# Patient Record
Sex: Female | Born: 1965 | Race: White | Hispanic: No | Marital: Married | Smoking: Never smoker
Health system: Southern US, Community
[De-identification: ages and names within clinical notes are randomized; demographics above are authoritative.]

## PROBLEM LIST (undated history)

## (undated) DIAGNOSIS — G809 Cerebral palsy, unspecified: Secondary | ICD-10-CM

## (undated) DIAGNOSIS — R32 Unspecified urinary incontinence: Secondary | ICD-10-CM

## (undated) DIAGNOSIS — J9600 Acute respiratory failure, unspecified whether with hypoxia or hypercapnia: Secondary | ICD-10-CM

## (undated) DIAGNOSIS — E785 Hyperlipidemia, unspecified: Secondary | ICD-10-CM

## (undated) DIAGNOSIS — G40909 Epilepsy, unspecified, not intractable, without status epilepticus: Secondary | ICD-10-CM

## (undated) DIAGNOSIS — R4702 Dysphasia: Secondary | ICD-10-CM

## (undated) DIAGNOSIS — F329 Major depressive disorder, single episode, unspecified: Secondary | ICD-10-CM

## (undated) DIAGNOSIS — F419 Anxiety disorder, unspecified: Secondary | ICD-10-CM

## (undated) DIAGNOSIS — R159 Full incontinence of feces: Secondary | ICD-10-CM

## (undated) DIAGNOSIS — E039 Hypothyroidism, unspecified: Secondary | ICD-10-CM

## (undated) DIAGNOSIS — I10 Essential (primary) hypertension: Secondary | ICD-10-CM

## (undated) DIAGNOSIS — F32A Depression, unspecified: Secondary | ICD-10-CM

## (undated) DIAGNOSIS — R569 Unspecified convulsions: Secondary | ICD-10-CM

## (undated) DIAGNOSIS — N319 Neuromuscular dysfunction of bladder, unspecified: Secondary | ICD-10-CM

## (undated) DIAGNOSIS — R1310 Dysphagia, unspecified: Secondary | ICD-10-CM

## (undated) DIAGNOSIS — G43909 Migraine, unspecified, not intractable, without status migrainosus: Secondary | ICD-10-CM

## (undated) DIAGNOSIS — O2230 Deep phlebothrombosis in pregnancy, unspecified trimester: Secondary | ICD-10-CM

## (undated) DIAGNOSIS — N200 Calculus of kidney: Secondary | ICD-10-CM

## (undated) DIAGNOSIS — M6281 Muscle weakness (generalized): Secondary | ICD-10-CM

## (undated) DIAGNOSIS — F70 Mild intellectual disabilities: Secondary | ICD-10-CM

## (undated) DIAGNOSIS — F29 Unspecified psychosis not due to a substance or known physiological condition: Secondary | ICD-10-CM

## (undated) DIAGNOSIS — A419 Sepsis, unspecified organism: Secondary | ICD-10-CM

## (undated) DIAGNOSIS — J189 Pneumonia, unspecified organism: Secondary | ICD-10-CM

## (undated) DIAGNOSIS — J309 Allergic rhinitis, unspecified: Secondary | ICD-10-CM

## (undated) DIAGNOSIS — K449 Diaphragmatic hernia without obstruction or gangrene: Secondary | ICD-10-CM

## (undated) DIAGNOSIS — E559 Vitamin D deficiency, unspecified: Secondary | ICD-10-CM

## (undated) DIAGNOSIS — L89159 Pressure ulcer of sacral region, unspecified stage: Secondary | ICD-10-CM

## (undated) DIAGNOSIS — G894 Chronic pain syndrome: Secondary | ICD-10-CM

## (undated) DIAGNOSIS — J449 Chronic obstructive pulmonary disease, unspecified: Secondary | ICD-10-CM

---

## 1997-09-08 ENCOUNTER — Other Ambulatory Visit: Admission: RE | Admit: 1997-09-08 | Discharge: 1997-09-08 | Payer: Self-pay | Admitting: Obstetrics and Gynecology

## 2000-10-14 ENCOUNTER — Inpatient Hospital Stay (HOSPITAL_COMMUNITY): Admission: EM | Admit: 2000-10-14 | Discharge: 2000-10-20 | Payer: Self-pay | Admitting: Psychiatry

## 2004-01-01 ENCOUNTER — Emergency Department (HOSPITAL_COMMUNITY): Admission: EM | Admit: 2004-01-01 | Discharge: 2004-01-02 | Payer: Self-pay | Admitting: Emergency Medicine

## 2004-01-05 ENCOUNTER — Emergency Department (HOSPITAL_COMMUNITY): Admission: EM | Admit: 2004-01-05 | Discharge: 2004-01-05 | Payer: Self-pay | Admitting: Emergency Medicine

## 2004-02-14 ENCOUNTER — Inpatient Hospital Stay (HOSPITAL_COMMUNITY): Admission: EM | Admit: 2004-02-14 | Discharge: 2004-02-16 | Payer: Self-pay | Admitting: *Deleted

## 2004-03-03 ENCOUNTER — Emergency Department (HOSPITAL_COMMUNITY): Admission: EM | Admit: 2004-03-03 | Discharge: 2004-03-04 | Payer: Self-pay | Admitting: Emergency Medicine

## 2004-03-25 ENCOUNTER — Emergency Department (HOSPITAL_COMMUNITY): Admission: EM | Admit: 2004-03-25 | Discharge: 2004-03-26 | Payer: Self-pay | Admitting: Emergency Medicine

## 2004-04-01 ENCOUNTER — Emergency Department (HOSPITAL_COMMUNITY): Admission: EM | Admit: 2004-04-01 | Discharge: 2004-04-01 | Payer: Self-pay | Admitting: Emergency Medicine

## 2004-04-24 ENCOUNTER — Emergency Department (HOSPITAL_COMMUNITY): Admission: EM | Admit: 2004-04-24 | Discharge: 2004-04-24 | Payer: Self-pay | Admitting: Emergency Medicine

## 2004-06-16 ENCOUNTER — Emergency Department (HOSPITAL_COMMUNITY): Admission: EM | Admit: 2004-06-16 | Discharge: 2004-06-16 | Payer: Self-pay | Admitting: Emergency Medicine

## 2004-08-19 ENCOUNTER — Emergency Department (HOSPITAL_COMMUNITY): Admission: EM | Admit: 2004-08-19 | Discharge: 2004-08-19 | Payer: Self-pay | Admitting: Emergency Medicine

## 2004-09-04 ENCOUNTER — Emergency Department (HOSPITAL_COMMUNITY): Admission: EM | Admit: 2004-09-04 | Discharge: 2004-09-04 | Payer: Self-pay | Admitting: Emergency Medicine

## 2004-09-11 ENCOUNTER — Emergency Department (HOSPITAL_COMMUNITY): Admission: EM | Admit: 2004-09-11 | Discharge: 2004-09-11 | Payer: Self-pay | Admitting: Emergency Medicine

## 2004-09-16 ENCOUNTER — Emergency Department (HOSPITAL_COMMUNITY): Admission: EM | Admit: 2004-09-16 | Discharge: 2004-09-16 | Payer: Self-pay | Admitting: Emergency Medicine

## 2004-10-04 ENCOUNTER — Emergency Department (HOSPITAL_COMMUNITY): Admission: EM | Admit: 2004-10-04 | Discharge: 2004-10-04 | Payer: Self-pay | Admitting: Emergency Medicine

## 2004-10-19 ENCOUNTER — Emergency Department (HOSPITAL_COMMUNITY): Admission: EM | Admit: 2004-10-19 | Discharge: 2004-10-19 | Payer: Self-pay | Admitting: Emergency Medicine

## 2004-10-27 ENCOUNTER — Emergency Department (HOSPITAL_COMMUNITY): Admission: EM | Admit: 2004-10-27 | Discharge: 2004-10-27 | Payer: Self-pay | Admitting: Emergency Medicine

## 2004-11-10 ENCOUNTER — Emergency Department (HOSPITAL_COMMUNITY): Admission: EM | Admit: 2004-11-10 | Discharge: 2004-11-10 | Payer: Self-pay | Admitting: Emergency Medicine

## 2004-11-18 ENCOUNTER — Emergency Department (HOSPITAL_COMMUNITY): Admission: EM | Admit: 2004-11-18 | Discharge: 2004-11-18 | Payer: Self-pay | Admitting: Emergency Medicine

## 2004-11-20 ENCOUNTER — Emergency Department (HOSPITAL_COMMUNITY): Admission: EM | Admit: 2004-11-20 | Discharge: 2004-11-20 | Payer: Self-pay | Admitting: Emergency Medicine

## 2004-12-21 ENCOUNTER — Emergency Department (HOSPITAL_COMMUNITY): Admission: EM | Admit: 2004-12-21 | Discharge: 2004-12-21 | Payer: Self-pay | Admitting: Emergency Medicine

## 2005-01-17 ENCOUNTER — Emergency Department (HOSPITAL_COMMUNITY): Admission: EM | Admit: 2005-01-17 | Discharge: 2005-01-18 | Payer: Self-pay | Admitting: Emergency Medicine

## 2005-02-11 ENCOUNTER — Emergency Department (HOSPITAL_COMMUNITY): Admission: EM | Admit: 2005-02-11 | Discharge: 2005-02-11 | Payer: Self-pay | Admitting: Emergency Medicine

## 2005-03-26 ENCOUNTER — Emergency Department (HOSPITAL_COMMUNITY): Admission: EM | Admit: 2005-03-26 | Discharge: 2005-03-26 | Payer: Self-pay | Admitting: Emergency Medicine

## 2009-06-23 ENCOUNTER — Inpatient Hospital Stay: Payer: Self-pay | Admitting: Internal Medicine

## 2009-07-22 ENCOUNTER — Ambulatory Visit: Payer: Self-pay | Admitting: Urology

## 2009-07-23 ENCOUNTER — Ambulatory Visit: Payer: Self-pay | Admitting: Family Medicine

## 2009-10-06 ENCOUNTER — Ambulatory Visit: Payer: Self-pay | Admitting: Cardiovascular Disease

## 2009-12-29 ENCOUNTER — Emergency Department: Payer: Self-pay | Admitting: Unknown Physician Specialty

## 2010-01-26 ENCOUNTER — Ambulatory Visit: Payer: Self-pay | Admitting: Family Medicine

## 2010-03-14 ENCOUNTER — Inpatient Hospital Stay: Payer: Self-pay | Admitting: Specialist

## 2010-03-23 ENCOUNTER — Emergency Department (HOSPITAL_COMMUNITY)
Admission: EM | Admit: 2010-03-23 | Discharge: 2010-03-23 | Payer: Self-pay | Source: Home / Self Care | Admitting: Emergency Medicine

## 2010-03-26 LAB — URINE MICROSCOPIC-ADD ON

## 2010-03-26 LAB — URINALYSIS, ROUTINE W REFLEX MICROSCOPIC
Urine Glucose, Fasting: NEGATIVE mg/dL
Urobilinogen, UA: 1 mg/dL (ref 0.0–1.0)

## 2010-03-27 LAB — URINE CULTURE: Colony Count: NO GROWTH

## 2010-04-15 ENCOUNTER — Emergency Department (HOSPITAL_COMMUNITY): Payer: Medicare Other

## 2010-04-15 ENCOUNTER — Emergency Department (HOSPITAL_COMMUNITY)
Admission: EM | Admit: 2010-04-15 | Discharge: 2010-04-15 | Disposition: A | Discharge status: Home / Self Care | Payer: Medicare Other | Attending: Emergency Medicine | Admitting: Emergency Medicine

## 2010-04-15 DIAGNOSIS — F79 Unspecified intellectual disabilities: Secondary | ICD-10-CM | POA: Insufficient documentation

## 2010-04-15 DIAGNOSIS — G809 Cerebral palsy, unspecified: Secondary | ICD-10-CM | POA: Insufficient documentation

## 2010-04-15 DIAGNOSIS — M79609 Pain in unspecified limb: Secondary | ICD-10-CM | POA: Insufficient documentation

## 2010-04-15 DIAGNOSIS — IMO0002 Reserved for concepts with insufficient information to code with codable children: Secondary | ICD-10-CM | POA: Insufficient documentation

## 2010-04-15 DIAGNOSIS — R059 Cough, unspecified: Secondary | ICD-10-CM | POA: Insufficient documentation

## 2010-04-15 DIAGNOSIS — R10814 Left lower quadrant abdominal tenderness: Secondary | ICD-10-CM | POA: Insufficient documentation

## 2010-04-15 DIAGNOSIS — R112 Nausea with vomiting, unspecified: Secondary | ICD-10-CM | POA: Insufficient documentation

## 2010-04-15 DIAGNOSIS — I10 Essential (primary) hypertension: Secondary | ICD-10-CM | POA: Insufficient documentation

## 2010-04-15 DIAGNOSIS — R05 Cough: Secondary | ICD-10-CM | POA: Insufficient documentation

## 2010-04-15 DIAGNOSIS — R509 Fever, unspecified: Secondary | ICD-10-CM | POA: Insufficient documentation

## 2010-04-15 LAB — CBC
HCT: 36.9 % (ref 36.0–46.0)
MCHC: 33.1 g/dL (ref 30.0–36.0)
MCV: 94.6 fL (ref 78.0–100.0)
Platelets: 108 10*3/uL — ABNORMAL LOW (ref 150–400)
RBC: 3.9 MIL/uL (ref 3.87–5.11)

## 2010-04-15 LAB — URINALYSIS, ROUTINE W REFLEX MICROSCOPIC
Bilirubin Urine: NEGATIVE
Ketones, ur: NEGATIVE mg/dL
Protein, ur: NEGATIVE mg/dL
Specific Gravity, Urine: 1.014 (ref 1.005–1.030)
Urine Glucose, Fasting: NEGATIVE mg/dL
Urobilinogen, UA: 0.2 mg/dL (ref 0.0–1.0)

## 2010-04-15 LAB — DIFFERENTIAL
Basophils Absolute: 0 10*3/uL (ref 0.0–0.1)
Basophils Relative: 0 % (ref 0–1)
Eosinophils Absolute: 0.4 10*3/uL (ref 0.0–0.7)
Eosinophils Relative: 8 % — ABNORMAL HIGH (ref 0–5)

## 2010-04-15 LAB — BASIC METABOLIC PANEL
BUN: 23 mg/dL (ref 6–23)
Calcium: 9.9 mg/dL (ref 8.4–10.5)
Glucose, Bld: 124 mg/dL — ABNORMAL HIGH (ref 70–99)
Sodium: 144 mEq/L (ref 135–145)

## 2010-04-15 LAB — URINE MICROSCOPIC-ADD ON

## 2010-04-16 ENCOUNTER — Emergency Department (HOSPITAL_COMMUNITY)
Admission: EM | Admit: 2010-04-16 | Discharge: 2010-04-16 | Disposition: A | Discharge status: Home / Self Care | Payer: Medicare Other | Attending: Emergency Medicine | Admitting: Emergency Medicine

## 2010-04-16 ENCOUNTER — Emergency Department (HOSPITAL_COMMUNITY): Payer: Medicare Other

## 2010-04-16 DIAGNOSIS — F79 Unspecified intellectual disabilities: Secondary | ICD-10-CM | POA: Insufficient documentation

## 2010-04-16 DIAGNOSIS — F329 Major depressive disorder, single episode, unspecified: Secondary | ICD-10-CM | POA: Insufficient documentation

## 2010-04-16 DIAGNOSIS — R51 Headache: Secondary | ICD-10-CM | POA: Insufficient documentation

## 2010-04-16 DIAGNOSIS — Z79899 Other long term (current) drug therapy: Secondary | ICD-10-CM | POA: Insufficient documentation

## 2010-04-16 DIAGNOSIS — G40909 Epilepsy, unspecified, not intractable, without status epilepticus: Secondary | ICD-10-CM | POA: Insufficient documentation

## 2010-04-16 DIAGNOSIS — G808 Other cerebral palsy: Secondary | ICD-10-CM | POA: Insufficient documentation

## 2010-04-16 DIAGNOSIS — F3289 Other specified depressive episodes: Secondary | ICD-10-CM | POA: Insufficient documentation

## 2010-04-16 DIAGNOSIS — I10 Essential (primary) hypertension: Secondary | ICD-10-CM | POA: Insufficient documentation

## 2010-04-16 DIAGNOSIS — Z86718 Personal history of other venous thrombosis and embolism: Secondary | ICD-10-CM | POA: Insufficient documentation

## 2010-04-16 DIAGNOSIS — Z7982 Long term (current) use of aspirin: Secondary | ICD-10-CM | POA: Insufficient documentation

## 2010-04-16 DIAGNOSIS — E039 Hypothyroidism, unspecified: Secondary | ICD-10-CM | POA: Insufficient documentation

## 2010-04-16 LAB — URINE MICROSCOPIC-ADD ON

## 2010-04-16 LAB — DIFFERENTIAL
Basophils Absolute: 0 10*3/uL (ref 0.0–0.1)
Eosinophils Absolute: 0.5 10*3/uL (ref 0.0–0.7)
Eosinophils Relative: 10 % — ABNORMAL HIGH (ref 0–5)
Lymphocytes Relative: 31 % (ref 12–46)
Monocytes Relative: 14 % — ABNORMAL HIGH (ref 3–12)

## 2010-04-16 LAB — COMPREHENSIVE METABOLIC PANEL
BUN: 21 mg/dL (ref 6–23)
CO2: 31 mEq/L (ref 19–32)
Creatinine, Ser: 0.63 mg/dL (ref 0.4–1.2)
GFR calc non Af Amer: >60 mL/min (ref >60)
Glucose, Bld: 91 mg/dL (ref 70–99)
Potassium: 3.7 mEq/L (ref 3.5–5.1)
Total Bilirubin: 0.4 mg/dL (ref 0.3–1.2)
Total Protein: 6 g/dL (ref 6.0–8.3)

## 2010-04-16 LAB — URINALYSIS, ROUTINE W REFLEX MICROSCOPIC
Bilirubin Urine: NEGATIVE
Nitrite: NEGATIVE
Urine Glucose, Fasting: NEGATIVE mg/dL
pH: 6.5 (ref 5.0–8.0)

## 2010-04-16 LAB — CBC
MCH: 31.8 pg (ref 26.0–34.0)
MCHC: 33.7 g/dL (ref 30.0–36.0)
Platelets: 116 10*3/uL — ABNORMAL LOW (ref 150–400)
WBC: 5.1 10*3/uL (ref 4.0–10.5)

## 2010-04-16 LAB — VALPROIC ACID LEVEL: Valproic Acid Lvl: 58 ug/mL (ref 50.0–100.0)

## 2010-04-18 LAB — URINE CULTURE
Colony Count: 15000
Culture  Setup Time: 201202140148

## 2010-07-20 NOTE — H&P (Signed)
NAMEROSELY, FERNANDEZ                 ACCOUNT NO.:  [id number]   MEDICAL RECORD NO.:  [id number]          PATIENT TYPE:  INP   LOCATION:  2011                         FACILITY:  [facility]   PHYSICIAN:  Deirdre Peer. Polite, M.D. DATE OF BIRTH:  [birth year]   DATE OF ADMISSION:  02/13/2004  DATE OF DISCHARGE:                                HISTORY & PHYSICAL   CHIEF COMPLAINT:  Diarrhea.   HISTORY OF THE PRESENT ILLNESS:  Ms. 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  Please note, the patient is a poor historian and limited  information is provided and obtained from current records; but, by report  the patient has been having some diarrhea and occasionally some nausea, but  she denies any emesis.  She also denies any fever or chills.  She does  complain of some abdominal discomfort.  The patient is transferred to the ED  for evaluation because of concerns of diarrhea.   In the ED the patient was evaluated and found to be tachycardic as high as  120-130, afebrile and labs significant for abnormal UA with many bacteria  and white blood cells.  Admission is deemed necessary for further evaluation  and treatment as the patient has a suprapubic catheter, and has a history of  recurrent UTIs.   At the time of my eval the patient is alert and oriented and in no apparent  distress.  She is unable to give any pertinent information secondary to  being a poor historian.  E chart was reviewed and no past discharge  summaries were found under the current MR number.  After review of the labs  it is felt the patient needs admission for a UTI and probable urosepsis.   PAST MEDICAL HISTORY:  The past medical history is per the record:  1.  Cerebral palsy.  2.  Left leg DVT on Coumadin.  3.   Questionable bronchitis.  4.  Questionable blindness.  5.  The patient states she may have had some trouble with her heart in the      past.  6.  Denies any diabetes or hypertension.   MEDICATIONS:  The medications per records show:  1.  Klonopin 0.5 mg  b.i.d.  2.  Tofranil 50 mg q.h.s.  3.  Naprosyn 500 mg b.i.d.  4.  Coumadin 10 mg daily.  5.  P.R.N. Tylenol.  6.  P.R.N. Mylanta, Darvocet and Ambien.   SOCIAL HISTORY:  The patient denies tobacco, alcohol or drugs.   PAST SURGICAL HISTORY:  The patient states she has a history of bilateral  Achilles cord surgery, prior knee surgery and eye surgery.  Patient has also  had a suprapubic catheter; patient states she has had this for approximately  four years.   FAMILY HISTORY:  The family history is noncontributory.   ALLERGIES:  The patient states she is allergic to SULFA.  REVIEW OF SYSTEMS:  The review of systems is as stated in the HPI.   PHYSICAL EXAMINATION:  GENERAL APPEARANCE:  In general the patient is alert  and oriented, and in no apparent distress.  VITAL SIGNS:  Temp 98.9, BP 145/89, pulse 110-122, respiratory rate of 22,  and sat 99%.  HEENT:  Within normal limits.  CHEST:  The chest clear to auscultation.  No rales.  No rhonchi.  HEART:  Cardiovascular is tachycardic.  ABDOMEN:  The abdomen is soft and nontender.  The patient does have a  suprapubic catheter and there does not appear to be any discharge from the  ostomy site where the CT enters the abdominal wall; however,  there is a  foul odor emanating from that area.  RECTAL:  The rectal exam is deferred.  NEUROLOGIC:  The patient is moving the upper extremities.  There is no  movement in the lower extremities. The patient has obvious atrophy in the  lower extremities.   LABORATORY DATA:  CBC; white count 9.1, hemoglobin 14.1 and platelets  185,000.  INR 2.5.  UA; specific gravity 1.022, moderate hemoglobin, ketones  negative, protein 30, leukocyte  esterase large, urine epithelium few, and  urine white blood cells 2,150, urine red blood cells 3-6, and bacteria many.  BMET; sodium 136, potassium 3.5, chloride 108, carbon dioxide 21, glucose  98, BUN 8, and creatinine 0.5.  AST and ALT 22 and 28.  Bilirubin 0.8.  Lipase 26.   ASSESSMENT:  1.  Urinary tract infection, rule out urosepsis as patient is tachycardiac      and has obvious signs of dehydration.  2.  Diarrhea, rule out secondary to #1 versus Clostridium difficile as the      patient has probably been on multiple antibiotics for recurrent urinary      tract infections.  3.  Cerebral palsy.  4.  History of deep venous thrombosis on Coumadin with a therapeutic INR.  5.  Suprapubic catheter; please note last changed one month ago.  6.  Poor historian.  7.  Tachycardia secondary to dehydration.  8.  Dehydration.   RECOMMENDATIONS:  1.  Recommend patient be admitted to a telemetry floor bed.  2.  Patient will be pancultured.  3.  Will be monitored.  4.  Will give IV fluids and IV antibiotics.  5.  Will consider a urology consult to see if it is time to have the      patient's catheter changed as this is probably the      most likely source of current infection.  6.  As the patient has diarrhea we will rule out C diff.  7.  Will make further recommendations after we get above studies.      Joen Laura   RDP/MEDQ  D:  02/14/2004  T:  02/14/2004  Job:  [id number]

## 2010-07-20 NOTE — Discharge Summary (Signed)
[facility]  Patient:    Kristi Perry, Kristi Perry Visit Number: [id number] MRN: [id number]          Service Type: PSY Location: 30 0300 02 Attending Physician:  Denny Peon Adm. Date:  [id number]                             Discharge Summary  INTRODUCTION:  The patient was admitted on involuntary commitment because of symptoms of depression.  Apparently she wrapped a cord around her neck while in the group home in attempt to strangle herself.  The group home manager complained with patients increased irritability and also she was having auditory and visual hallucinations.  The patient was unhappy with how things were at the residential setting but according to mother, she is never satisfied no matter where she is.  Since patient expressed suicidal ideations and made suicidal gesture, she was admitted to our hospital for evaluation and treatment.  The patient has a long history with [facility] and has a previous history of at least two admissions in 2001 at [facility].  MEDICAL PROBLEMS:  The patient suffers from cerebral palsy.  Upon admission, she had cellulitis of lower leg which was treated with Augmentin 875 mg twice a day with good results while on the unit.  As mentioned before, patient has a history of cerebral palsy and for all practical purposes, she is wheelchair bound.  Details of patients history are available in the chart.  HOSPITAL COURSE:  After admission to the ward, patient was placed on special observation.  Her medication has been gradually adjusted, in particular, increased dose of Seroquel to help with her auditory hallucinations and their impulsivity.  I kept patient on the same dose of Celexa.  As time passed, patient adjusted well to a new environment and improved markedly.  There were not any major behavioral incidents worth mentioning.  I was in contact with patients mother and case worker  contacted the mother several times as well. On the day of discharge, patient presented with bright affect, no dangerous ideations, no psychosis.  She still did not feel ready for discharge but it seems like patient has a hard time making any changes and also seems like that she adjusted to attention she got while in the hospital and is going to miss it.  Nevertheless, treatment team felt and undersigned concurred that the patient reached maximum benefit of her hospitalization and could be discharged home.  No side-effects from medications were noted.  Review of blood work showed normal hemoglobin, chemistry-17 and thyroid panel.  Vital signs were stable throughout the hospitalization.  The patient was afebrile.  Cellulitis cleared while treated with Augmentin.  The patient had some edema of lower legs which improved much since she started being compliant with my request for her to keep her legs elevated.  On the day of discharge, bright affect, no dangerous ideation, no psychosis, a bit anxious when discharge was announced, tearful, ______ 0.5 mg p.r.n. anxiety and felt well since.  DISCHARGE DIAGNOSES: Axis I:    Major depression disorder, recurrent, moderate to severe, with            psychotic features. Axis II:   Borderline intellectual functioning. Axes III:  1. Cellulitis, left calf.            2. Cerebral palsy. Axis IV:   Moderate stressor leading to personal care home. Axis V:  Global assessment of functioning upon admission was 35; for the            past year maximum of 60; upon discharge between 55 and 60.  DISCHARGE RECOMMENDATION:  The patient received prescription for the following medications: 1. Seroquel 25 mg three times a day and Seroquel 150 mg at bedtime. 2. Depakote 500 mg at bedtime. 3. Celexa 40 mg in the morning. 4. Multivitamins over the counter one tablet daily. 5. Detrol LA 4 mg one daily. 6. Augmentin 875 mg twice a day -- to be stopped on October 22, 2000.  Patient should check with family doctor if recurrence of cellulitis on lower leg.  She should keep legs elevated while sitting.   She also should have liver function tests and Depakote level checked on a monthly basis.  Patient should call mental health if problems with medication, presence of side-effects or recurrence of her symptoms.  Patient understood instructions and in good condition, was discharged in care of personal care home. DD:  10/20/00 TD:  10/20/00 Job: [id number]

## 2010-07-20 NOTE — Consult Note (Signed)
Kristi Perry, Kristi Perry                 ACCOUNT NO.:  [id number]   MEDICAL RECORD NO.:  [id number]          PATIENT TYPE:  INP   LOCATION:  2011                         FACILITY:  [facility]   PHYSICIAN:  Jamison Neighbor, M.D.  DATE OF BIRTH:  1965/03/19   DATE OF CONSULTATION:  02/15/2004  DATE OF DISCHARGE:                                   CONSULTATION   REASON FOR CONSULTATION:  Request replacement of suprapubic tube and  evaluation of possible UTI.   HISTORY:  This 45 year old female suffers from cerebral palsy.  The patient  has had a suprapubic catheter for several years.  The patient is in assisted  living facility and has the catheter changed every month.  The patient is  now in the hospital because of problems with diarrhea and tachycardia.  It  was felt at the time of admission that she probably was dehydrated and it  was thought that she might have a urinary tract infection.  Cultures have  been ordered and the patient has been started on antibiotic therapy.  Urologic consult was requested to change her suprapubic tube.   The patient does have her tube changed every month by nursing personnel at  her assisted living facility.  I do not know whether the hospital rules and  regulations allow nurse to change the catheter but certainly catheters that  are stable and have been in for a long period of time are generally changed  by nurses.   PAST MEDICAL HISTORY:  1.  Remarkable, of course, for cerebral palsy.  2.  The patient has a left leg DVT and has been on Coumadin.  3.  She has had problems with bronchitis.  4.  She also states that she may have had a stroke.   CURRENT MEDICATIONS:  1.  Tofranil.  2.  Protonix.  3.  Klonopin.  4.  Cymbalta.  5.  Coumadin.  6.  IV Cipro.  7.  Percocet.  8.  Phenergan.  9.  Restoril.  10. Xopenex.  11. Nitroglycerin.   SOCIAL HISTORY:  Unremarkable.  She does not use tobacco or alcohol.   PAST SURGICAL HISTORY:  1.  Achilles' cord  surgery.  2.  Knee surgery.  3.  Eye surgery.  4.  Placement of suprapubic catheter.  The patient does not remember who      actually replaced this catheter.   ALLERGIES:  SULFA.   PHYSICAL EXAMINATION:  GENERAL:  The patient is found at the bed side.  She  was able to communicate a reasonable amount of information to [place].  She was  alert and oriented x3.  VITAL SIGNS:  At the time of the evaluation she is afebrile.  Blood pressure  135/70, pulse 92, respirations 16, O2 saturation 100.  Patient has had good  urine output through the catheter which is clear.  ABDOMEN:  No CVA tenderness noted.  Abdomen soft, nontender with no palpable  mass, rebound, or guarding.  GENITOURINARY:  Not performed.   I changed the suprapubic tube without difficulty and placed a new 18-French  catheter.  I think when she gets back to the nursing home  they are going to  want to put in a larger catheter.  This is the only one available on the  floor at this time.   If the patient is here long enough to require another catheter, I will be  more than happy to show the nurses how to do this if they are allowed to do  this by hospital rules and regulations.   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  More than likely she will be given  antibiotics until  asymptomatic and then continue with her regular stent changes on a monthly  basis.  One thing that may help is to place her on Urex one tablet b.i.d.  which is a urinary acidifier and can cut down on infections without raising  the risk of the development of resistant organisms.  If the patient requires  another catheter change or if there are any other urologic needs please  contact the office and we would be happy to see her.       RJE/MEDQ  D:   02/15/2004  T:  02/15/2004  Job:  [id number]   cc:   Deirdre Peer. Polite, M.D.

## 2010-07-20 NOTE — H&P (Signed)
[facility]  Patient:    Kristi Perry, STEADMAN                        MRN: [id number] Adm. Date:  [id number] Disc. Date: [id number] Attending:  Kelvin Cellar Dictator:   Young Berry Lorin Picket, N.P.                   Psychiatric Admission Assessment  DATE OF ASSESSMENT:  October 15, 2000 at 9 a.m.  IDENTIFYING INFORMATION:  This is a 45 year old Caucasian female, who is involuntarily committed for symptoms of depression.  HISTORY OF PRESENT ILLNESS:  Patient is interviewed today with Dr. Netta Cedars.  The patient is involuntarily committed after wrapping a cord around her neck at the group home and attempting to strangle herself.  The group home manager complained of the patients increased irritability and stated that she was also having auditory and visual hallucinations.  Today, the patient confirms that the group home manager had given her 30-days notice as a warning about her behavior and the patient admits that she has been irritable and angry at times with the managers and with other residents.  She reports that she has been depressed for approximately two weeks and hearing voices saying that "someone wants to shoot me."  Patient reports also difficulty sleeping at night because of the voices bothering her.  Patient also reports seeing "people and stars" in her vision.  She states that both her auditory and visual hallucinations persist even today.  Patient states that she wants to die but thinks that she would be more hopeful if she could find another place to live.  She is able to promise safety on the unit and states that she hopes she can get some help here because "my thinking is confused."  PAST PSYCHIATRIC HISTORY:  Patient is a longstanding patient at [facility] for many years as an outpatient.  Her caseworker is [employer].  She has a history of prior inpatient admissions at least twice in 2000 and 2001 at [facility].  SOCIAL HISTORY:  Patient lives at [facility] since August of 2001. Her parents are Onalee Hua and [employer], who she states live in [place] but she states she is estranged from them and gets no support and sees them rarely.  She states that she attended special education classes through the 12th grade and did graduate and she works at [employer], a division of [employer], on a daily basis.  She states that she is originally from [place] and moved to [place] several years ago.  She is single, never married.  She has no children.  FAMILY HISTORY:  Not known.  ALCOHOL/DRUG HISTORY:  Patient is a nonsmoker.  She denies any drug or alcohol abuse.  MEDICAL HISTORY:  [facility] reports that patients primary care physician is Dr. Barnetta Chapel.  Medical problems include cerebral palsy. Patient was also seen in the emergency room yesterday and diagnosed with cellulitis of her left lower leg and rash in her crural folds.  MEDICATIONS:  Depakote 500 mg p.o. q.h.s., Depo-Provera every three months, Augmentin 875 mg q.12h. for seven days, Lamisil cream to diaper area and crural folds b.i.d., Celexa 40 mg q.d., Detrol 4 mg p.o. q.d., multivitamin q.d. and Seroquel 25 mg b.i.d. and q.12h. p.r.n. for agitation.  DRUG ALLERGIES:  ASA and SULFA.  POSITIVE PHYSICAL FINDINGS:  Patient  was seen in the ER at [facility].  Her PE revealed some redness and trace edema in her left lower leg and she was prescribed Augmentin for cellulitis in her left leg.  On admission to the unit, her temperature is 97.1, pulse 99, respirations 14, blood pressure 112/73.  She is approximately 5 feet 3 inches tall and weighs 176 pounds.  Exam today, while toileting, reveals that she does have a reddened area in bilateral crural folds, however, her skin is intact.  The area is erythematous but not excoriated.  LABORATORY DATA:  Patients  CBC is within normal limits.  CMET, taken yesterday, showed potassium at 4.0; today that is at 3.4.  Patients valproic acid level, thyroid panel, urinalysis and UDS are all pending at this time.  MENTAL STATUS EXAMINATION:  This is a casually dressed Caucasian female. Hygiene is satisfactory.  She is in a wheelchair with an anxious affect.  She is tearful and cooperative.  Speech is slow in pace and normal in tone.  Mood is tearful, sad and anxious.  Thought process is positive for visual hallucinations and auditory hallucinations.  Positive for some suicidal ideation with no intent or plan.  She is able to promise safety on the unit. No homicidal ideation.  Cognitively, she is oriented x 3 and is intact.  Her intelligence is below average.  Judgment is fair to poor.  Her distant recall is poor.  Her immediate recall is satisfactory.  Insight is poor.  DIAGNOSES: Axis I:    Major depression, recurrent, severe with psychotic features. Axis II:   Deferred. Axis III:  1. Cellulitis of her left calf.            2. Cerebral palsy. Axis IV:   Deferred. Axis V:    Current 35; past year 59.  PLAN:  Voluntarily admit the patient to stabilize her mood and to alleviate her suicidal thoughts.  We will assist her with ADLs as needed, especially toileting on a p.r.n. basis and q.2h.  We will push Gatorade and recheck electrolytes on August 16.  We will restart her routine medications and give Lamisil cream to her reddened areas and her crural folds and we will give her Augmentin 875 mg b.i.d. for her cellulitis.  We will increase her Seroquel to 25 mg b.i.d. and q.12h. p.r.n. for hallucinations and also give her 50 mg q.h.s. for sleep.  We will contact [facility] for additional history and we will ask the caseworker to assist with discharge planning and evaluate her placement options.  ESTIMATED LENGTH OF STAY:  Three to five days. DD:  10/15/00 TD:  10/15/00 Job:  [id number]

## 2011-03-25 ENCOUNTER — Encounter (HOSPITAL_COMMUNITY): Payer: Self-pay | Admitting: *Deleted

## 2011-03-25 ENCOUNTER — Emergency Department (HOSPITAL_COMMUNITY)
Admission: EM | Admit: 2011-03-25 | Discharge: 2011-03-26 | Disposition: A | Discharge status: Skilled Nursing Facility | Payer: Medicare Other | Attending: Emergency Medicine | Admitting: Emergency Medicine

## 2011-03-25 ENCOUNTER — Emergency Department (HOSPITAL_COMMUNITY): Payer: Medicare Other

## 2011-03-25 DIAGNOSIS — Z7982 Long term (current) use of aspirin: Secondary | ICD-10-CM | POA: Insufficient documentation

## 2011-03-25 DIAGNOSIS — Z79899 Other long term (current) drug therapy: Secondary | ICD-10-CM | POA: Insufficient documentation

## 2011-03-25 DIAGNOSIS — F329 Major depressive disorder, single episode, unspecified: Secondary | ICD-10-CM | POA: Insufficient documentation

## 2011-03-25 DIAGNOSIS — N390 Urinary tract infection, site not specified: Secondary | ICD-10-CM | POA: Insufficient documentation

## 2011-03-25 DIAGNOSIS — Z86718 Personal history of other venous thrombosis and embolism: Secondary | ICD-10-CM | POA: Insufficient documentation

## 2011-03-25 DIAGNOSIS — R509 Fever, unspecified: Secondary | ICD-10-CM

## 2011-03-25 DIAGNOSIS — R05 Cough: Secondary | ICD-10-CM

## 2011-03-25 DIAGNOSIS — F3289 Other specified depressive episodes: Secondary | ICD-10-CM | POA: Insufficient documentation

## 2011-03-25 DIAGNOSIS — R071 Chest pain on breathing: Secondary | ICD-10-CM | POA: Insufficient documentation

## 2011-03-25 DIAGNOSIS — R059 Cough, unspecified: Secondary | ICD-10-CM | POA: Insufficient documentation

## 2011-03-25 HISTORY — DX: Sepsis, unspecified organism: A41.9

## 2011-03-25 HISTORY — DX: Essential (primary) hypertension: I10

## 2011-03-25 HISTORY — DX: Depression, unspecified: F32.A

## 2011-03-25 HISTORY — DX: Major depressive disorder, single episode, unspecified: F32.9

## 2011-03-25 HISTORY — DX: Deep phlebothrombosis in pregnancy, unspecified trimester: O22.30

## 2011-03-25 NOTE — ED Notes (Signed)
Patient back from  X-ray 

## 2011-03-25 NOTE — ED Notes (Signed)
Per EMS:  Pt has had a fever 2 days, st's she developed chest pain earlier today that the pt st's radiates to her entire body, pt st's she has been having un witnessed seizures all day because they refuse to give her seizure meds.  St's she has had a productive cough for a couple of weeks, st's she was dx with pna 2 weeks ago.  Lungs sounds for EMS sound wet when pt coughs.

## 2011-03-25 NOTE — ED Notes (Signed)
Pt st's she started having chest pain around 9am this morning, and has had a productive cough since this morning, st's she has been coughing up blood.  Pt in NAD, breath sounds clear and equal bilaterally.  St's she feels like she has pna again.  Pt complains of n/v/d.

## 2011-03-25 NOTE — ED Notes (Signed)
Patient is resting comfortably. 

## 2011-03-25 NOTE — ED Notes (Signed)
ZOX:[id number]<AV> Expected date:03/25/11<BR> Expected time:10:10 PM<BR> Means of arrival:Ambulance<BR> Comments:<BR> EMS 10 GC, 44 yof cough and fever, pain all over

## 2011-03-25 NOTE — ED Notes (Signed)
Pt's mother Nadelyn Enriques call and left her number: [phone].  Her mother st's that she continually complains of chest pain to get out of the nursing home and into the hospital.

## 2011-03-25 NOTE — ED Notes (Signed)
Patient transported to X-ray 

## 2011-03-26 ENCOUNTER — Other Ambulatory Visit: Payer: Self-pay

## 2011-03-26 LAB — CBC
HCT: 42.1 % (ref 36.0–46.0)
Hemoglobin: 14 g/dL (ref 12.0–15.0)
MCH: 29.5 pg (ref 26.0–34.0)
MCHC: 33.3 g/dL (ref 30.0–36.0)

## 2011-03-26 LAB — URINE CULTURE

## 2011-03-26 LAB — URINALYSIS, ROUTINE W REFLEX MICROSCOPIC
Glucose, UA: NEGATIVE mg/dL
Ketones, ur: NEGATIVE mg/dL
Protein, ur: NEGATIVE mg/dL
pH: 7 (ref 5.0–8.0)

## 2011-03-26 LAB — BASIC METABOLIC PANEL
BUN: 18 mg/dL (ref 6–23)
Calcium: 10 mg/dL (ref 8.4–10.5)
GFR calc non Af Amer: >90 mL/min (ref >90)
Glucose, Bld: 104 mg/dL — ABNORMAL HIGH (ref 70–99)

## 2011-03-26 LAB — URINE MICROSCOPIC-ADD ON

## 2011-03-26 LAB — POCT I-STAT TROPONIN I: Troponin i, poc: 0.01 ng/mL (ref 0.00–0.08)

## 2011-03-26 MED ORDER — ALBUTEROL SULFATE (5 MG/ML) 0.5% IN NEBU
5.0000 mg | INHALATION_SOLUTION | Freq: Once | RESPIRATORY_TRACT | Status: AC
  Administered 2011-03-26: 5 mg via RESPIRATORY_TRACT
  Filled 2011-03-26: qty 0.5

## 2011-03-26 MED ORDER — CEPHALEXIN 500 MG PO CAPS: 500.0000 mg | ORAL_CAPSULE | Freq: Four times a day (QID) | ORAL | Status: AC

## 2011-03-26 MED ORDER — OXYCODONE-ACETAMINOPHEN 5-325 MG PO TABS
ORAL_TABLET | ORAL | Status: AC
  Filled 2011-03-26: qty 1

## 2011-03-26 MED ORDER — DEXTROSE 5 % IV SOLN
1.0000 g | Freq: Once | INTRAVENOUS | Status: AC
  Administered 2011-03-26: 1 g via INTRAVENOUS
  Filled 2011-03-26: qty 10

## 2011-03-26 MED ORDER — OXYCODONE-ACETAMINOPHEN 5-325 MG PO TABS
1.0000 | ORAL_TABLET | Freq: Once | ORAL | Status: AC
  Administered 2011-03-26: 1 via ORAL

## 2011-03-26 NOTE — ED Provider Notes (Signed)
History     CSN: [id number]  Arrival date & time 03/25/11  2210   First MD Initiated Contact with Patient 03/25/11 2343      Chief Complaint  Patient presents with  . Fever  . Chest Pain    (Consider location/radiation/quality/duration/timing/severity/associated sxs/prior treatment) HPI Patient presents with fever and cough. She states she was diagnosed with pneumonia several weeks ago and was hospitalized at an outside hospital. She states that her symptoms improved but then over the past 2 days her cough has worsened and she's also had fever. She states she has diffuse body aches and pain in her chest with coughing. She also feels that she's been wheezing. She denies that anything makes her symptoms better or worse. She's had no swelling of her legs. She has been drinking fluids normally without vomiting.  There are no alleviating or modifying factors, there are no associated systemic symptoms  Past Medical History  Diagnosis Date  . Sepsis   . Hypertension   . DVT (deep vein thrombosis) in pregnancy   . Depression     History reviewed. No pertinent past surgical history.  No family history on file.  History  Substance Use Topics  . Smoking status: Not on file  . Smokeless tobacco: Not on file  . Alcohol Use:     OB History    Grav Para Term Preterm Abortions TAB SAB Ect Mult Living                  Review of Systems ROS reviewed and otherwise negative except for mentioned in HPI  Allergies  Sulfa antibiotics  Home Medications   Current Outpatient Rx  Name Route Sig Dispense Refill  . ASPIRIN EC 81 MG PO TBEC Oral Take 81 mg by mouth daily.    Marland Kitchen BACLOFEN 10 MG PO TABS Oral Take 10 mg by mouth 3 (three) times daily.    . BECLOMETHASONE DIPROPIONATE 80 MCG/ACT IN AERS Inhalation Inhale 2 puffs into the lungs as needed.    Marland Kitchen VITAMIN D 400 UNITS PO CAPS Oral Take 400 Units by mouth daily.    Marland Kitchen CITALOPRAM HYDROBROMIDE 40 MG PO TABS Oral Take 40 mg by mouth at  bedtime.    Marland Kitchen CLONAZEPAM 1 MG PO TABS Oral Take 1 mg by mouth 2 (two) times daily as needed.    Marland Kitchen FLUTICASONE PROPIONATE 50 MCG/ACT NA SUSP Nasal Place 2 sprays into the nose daily.    Marland Kitchen HYDROCODONE-ACETAMINOPHEN 10-325 MG PO TABS Oral Take 1 tablet by mouth every 6 (six) hours as needed.    . IPRATROPIUM BROMIDE HFA 17 MCG/ACT IN AERS Inhalation Inhale 2 puffs into the lungs every 6 (six) hours.    . IPRATROPIUM BROMIDE 0.02 % IN SOLN Nebulization Take 500 mcg by nebulization 4 (four) times daily.    Marland Kitchen KETOTIFEN FUMARATE 0.025 % OP SOLN Both Eyes Place 1 drop into both eyes 2 (two) times daily.    Marland Kitchen LAMOTRIGINE 100 MG PO TABS Oral Take 100 mg by mouth daily.    Marland Kitchen LEVETIRACETAM 250 MG PO TABS Oral Take 250 mg by mouth every 12 (twelve) hours.    Marland Kitchen LEVOTHYROXINE SODIUM 75 MCG PO TABS Oral Take 75 mcg by mouth daily.    Marland Kitchen LORAZEPAM 1 MG PO TABS Oral Take 1 mg by mouth every 6 (six) hours as needed.    Marland Kitchen MONTELUKAST SODIUM 10 MG PO TABS Oral Take 10 mg by mouth at bedtime.    Marland Kitchen  CERTA-VITE PO Oral Take 1 tablet by mouth daily.    Marland Kitchen NITROGLYCERIN 0.4 MG SL SUBL Sublingual Place 0.4 mg under the tongue every 5 (five) minutes as needed.    Marland Kitchen OMEPRAZOLE 20 MG PO CPDR Oral Take 20 mg by mouth daily.    . OXYCODONE HCL 5 MG PO TABS Oral Take 5 mg by mouth 2 (two) times daily. Hold for sedation    . PRAVASTATIN SODIUM 40 MG PO TABS Oral Take 40 mg by mouth daily.    Marland Kitchen RISPERIDONE 2 MG PO TABS Oral Take 2 mg by mouth 2 (two) times daily.    . SODIUM CHLORIDE 0.65 % NA SOLN Nasal Place 1 spray into the nose 2 (two) times daily.    Marland Kitchen VITAMIN C 500 MG PO TABS Oral Take 500 mg by mouth 2 (two) times daily.    Marland Kitchen ZOLPIDEM TARTRATE 5 MG PO TABS Oral Take 5 mg by mouth at bedtime as needed.    . CEPHALEXIN 500 MG PO CAPS Oral Take 1 capsule (500 mg total) by mouth 4 (four) times daily. 28 capsule 0    BP 146/72  Pulse 85  Temp(Src) 98.6 F (37 C) (Oral)  Resp 22  SpO2 97% Vitals reviewed Physical  Exam Physical Examination: General appearance - alert, well appearing, and in no distress Mental status - alert, oriented to person, place, and time Mouth - mucous membranes moist, pharynx normal without lesions Chest - clear to auscultation, no wheezes, rales or rhonchi, symmetric air entry, no increased work of breathing, no wheezing, occasional cough Heart - normal rate, regular rhythm, normal S1, S2, no murmurs, rubs, clicks or gallops Abdomen - soft, nontender, nondistended, no masses or organomegaly Neurological - alert, oriented, normal speech, no focal findings, sensation intact Musculoskeletal - no joint tenderness, deformity or swelling Extremities - peripheral pulses normal, no pedal edema, no clubbing or cyanosis Skin - normal coloration and turgor, no rashes  ED Course  Procedures (including critical care time)  Date: 03/26/2011  Rate: 88  Rhythm: normal sinus rhythm  QRS Axis: normal  Intervals: normal  ST/T Wave abnormalities: normal  Conduction Disutrbances:none  Narrative Interpretation:   Old EKG Reviewed: unchanged compared to prior EKG of 11/10/2004    Labs Reviewed  BASIC METABOLIC PANEL - Abnormal; Notable for the following:    Glucose, Bld 104 (*)    All other components within normal limits  URINALYSIS, ROUTINE W REFLEX MICROSCOPIC - Abnormal; Notable for the following:    APPearance TURBID (*)    Hgb urine dipstick LARGE (*)    Nitrite POSITIVE (*)    Leukocytes, UA LARGE (*)    All other components within normal limits  URINE MICROSCOPIC-ADD ON - Abnormal; Notable for the following:    Squamous Epithelial / LPF MANY (*)    Bacteria, UA MANY (*)    All other components within normal limits  CBC  D-DIMER, QUANTITATIVE  POCT I-STAT TROPONIN I  I-STAT TROPONIN I  URINE CULTURE   Dg Chest 2 View  03/26/2011  *RADIOLOGY REPORT*  Clinical Data: Fever, cough and shortness of breath; chest pain.  CHEST - 2 VIEW  Comparison: Chest radiograph performed  04/15/2010  Findings: The lungs are well-aerated and clear.  There is no evidence of focal opacification, pleural effusion or pneumothorax. Pulmonary vascularity is at the upper limits of normal.  The heart is normal in size; the mediastinal contour is within normal limits.  No acute osseous abnormalities are seen.  IMPRESSION: No acute cardiopulmonary process seen.  Original Report Authenticated By: Tonia Ghent, M.D.     1. Fever   2. Urinary tract infection   3. Cough       MDM  Patient presenting with fever for 2 days. She also has a cough. She is a nursing home resident and was recently admitted at in outside hospital for pneumonia. She complains of chest pain but by her report the chest pain is only with coughing. Her workup today included and negative chest x-ray reassuring EKG normal d-dimer normal troponin. She was found to have signs of a urinary tract infection on urinalysis. Urine was obtained by cath specimen. A urine culture was sent, she was started on Rocephin. She will be discharged back to the nursing home on antibiotics for the UTI. She is nontoxic and well-hydrated in appearance. She was given strict return precautions.        Ethelda Chick, MD 03/26/11 [phone]

## 2011-03-26 NOTE — ED Notes (Signed)
Called [facility] 

## 2011-03-29 NOTE — ED Notes (Signed)
+   Resp. Patient treated with Keflex- Chart sent to EDO office for review.

## 2011-03-30 NOTE — ED Notes (Addendum)
Prescribed Macrobid 1 tablet PO BID #14. Prescribed by Felicie Morn. Wants patient to follow up with [facility]. Called patient. Phone kept ringing.Unable to contact via phone . Letter sent to [employer].

## 2011-04-09 ENCOUNTER — Encounter (HOSPITAL_COMMUNITY): Payer: Self-pay | Admitting: *Deleted

## 2011-04-09 ENCOUNTER — Emergency Department (HOSPITAL_COMMUNITY)
Admission: EM | Admit: 2011-04-09 | Discharge: 2011-04-10 | Disposition: A | Discharge status: Skilled Nursing Facility | Payer: Medicare Other | Attending: Emergency Medicine | Admitting: Emergency Medicine

## 2011-04-09 DIAGNOSIS — F329 Major depressive disorder, single episode, unspecified: Secondary | ICD-10-CM | POA: Insufficient documentation

## 2011-04-09 DIAGNOSIS — F411 Generalized anxiety disorder: Secondary | ICD-10-CM | POA: Insufficient documentation

## 2011-04-09 DIAGNOSIS — I10 Essential (primary) hypertension: Secondary | ICD-10-CM | POA: Insufficient documentation

## 2011-04-09 DIAGNOSIS — R300 Dysuria: Secondary | ICD-10-CM | POA: Insufficient documentation

## 2011-04-09 DIAGNOSIS — Z79899 Other long term (current) drug therapy: Secondary | ICD-10-CM | POA: Insufficient documentation

## 2011-04-09 DIAGNOSIS — R059 Cough, unspecified: Secondary | ICD-10-CM | POA: Insufficient documentation

## 2011-04-09 DIAGNOSIS — R05 Cough: Secondary | ICD-10-CM | POA: Insufficient documentation

## 2011-04-09 DIAGNOSIS — Z86718 Personal history of other venous thrombosis and embolism: Secondary | ICD-10-CM | POA: Insufficient documentation

## 2011-04-09 DIAGNOSIS — R509 Fever, unspecified: Secondary | ICD-10-CM | POA: Insufficient documentation

## 2011-04-09 DIAGNOSIS — F3289 Other specified depressive episodes: Secondary | ICD-10-CM | POA: Insufficient documentation

## 2011-04-09 DIAGNOSIS — N390 Urinary tract infection, site not specified: Secondary | ICD-10-CM | POA: Insufficient documentation

## 2011-04-09 NOTE — ED Notes (Signed)
Per EMS:  Pt was diagnosed with a UTI one week ago, just finished her abxs yesterday morning.  EMS was called out because pt was having an anxiety attack, pt was given 1mg  of ativan.  Nursing home told EMS the ativan worked.  Pt complaining of pain, pt warmed to the touch.  Pt st's she feels like she did when she had pneumonia.

## 2011-04-09 NOTE — ED Notes (Signed)
Pt st's she feels "sick" and her chest hurts.  Pt st's she's currently having a stroke.  Neuro status intact.  Pt is a chronic pain pt, st's she has some SOB, all lung sounds clear and equal bilaterally.  Pt in no acute distress.

## 2011-04-09 NOTE — ED Notes (Signed)
FAO:ZHYQM<VH> Expected date:04/09/11<BR> Expected time: 9:27 PM<BR> Means of arrival:Ambulance<BR> Comments:<BR> EMS 212 GC, 45 yof anxiety attack

## 2011-04-10 ENCOUNTER — Emergency Department (HOSPITAL_COMMUNITY): Payer: Medicare Other

## 2011-04-10 LAB — URINALYSIS, ROUTINE W REFLEX MICROSCOPIC
Bilirubin Urine: NEGATIVE
Bilirubin Urine: NEGATIVE
Glucose, UA: NEGATIVE mg/dL
Glucose, UA: NEGATIVE mg/dL
Ketones, ur: NEGATIVE mg/dL
Ketones, ur: NEGATIVE mg/dL
Nitrite: POSITIVE — AB
Nitrite: POSITIVE — AB
Protein, ur: 30 mg/dL — AB
Protein, ur: 100 mg/dL — AB
Specific Gravity, Urine: 1.025 (ref 1.005–1.030)
Specific Gravity, Urine: 1.024 (ref 1.005–1.030)
Urobilinogen, UA: 0.2 mg/dL (ref 0.0–1.0)
Urobilinogen, UA: 0.2 mg/dL (ref 0.0–1.0)
pH: 6 (ref 5.0–8.0)
pH: 5.5 (ref 5.0–8.0)

## 2011-04-10 LAB — DIFFERENTIAL
Basophils Absolute: 0 10*3/uL (ref 0.0–0.1)
Basophils Relative: 0 % (ref 0–1)
Eosinophils Absolute: 0.2 10*3/uL (ref 0.0–0.7)
Eosinophils Relative: 3 % (ref 0–5)
Lymphocytes Relative: 37 % (ref 12–46)
Lymphs Abs: 2.4 10*3/uL (ref 0.7–4.0)
Monocytes Absolute: 0.7 10*3/uL (ref 0.1–1.0)
Monocytes Relative: 11 % (ref 3–12)
Neutro Abs: 3.2 10*3/uL (ref 1.7–7.7)
Neutrophils Relative %: 49 % (ref 43–77)

## 2011-04-10 LAB — BASIC METABOLIC PANEL WITH GFR
BUN: 18 mg/dL (ref 6–23)
CO2: 28 meq/L (ref 19–32)
Calcium: 9.7 mg/dL (ref 8.4–10.5)
Chloride: 101 meq/L (ref 96–112)
Creatinine, Ser: 0.54 mg/dL (ref 0.50–1.10)
GFR calc Af Amer: >90 mL/min
GFR calc non Af Amer: >90 mL/min
Glucose, Bld: 89 mg/dL (ref 70–99)
Potassium: 3.9 meq/L (ref 3.5–5.1)
Sodium: 137 meq/L (ref 135–145)

## 2011-04-10 LAB — CBC
HCT: 40.5 % (ref 36.0–46.0)
Hemoglobin: 13.6 g/dL (ref 12.0–15.0)
MCH: 30.2 pg (ref 26.0–34.0)
MCHC: 33.6 g/dL (ref 30.0–36.0)
MCV: 89.8 fL (ref 78.0–100.0)
Platelets: 147 10*3/uL — ABNORMAL LOW (ref 150–400)
RBC: 4.51 MIL/uL (ref 3.87–5.11)
RDW: 13.5 % (ref 11.5–15.5)
WBC: 6.5 10*3/uL (ref 4.0–10.5)

## 2011-04-10 LAB — URINE MICROSCOPIC-ADD ON

## 2011-04-10 MED ORDER — NITROFURANTOIN MONOHYD MACRO 100 MG PO CAPS: 100.0000 mg | ORAL_CAPSULE | Freq: Two times a day (BID) | ORAL | Status: AC

## 2011-04-10 NOTE — ED Provider Notes (Signed)
Medical screening examination/treatment/procedure(s) were performed by non-physician practitioner and as supervising physician I was immediately available for consultation/collaboration.  Juliet Rude. Rubin Payor, MD 04/10/11 [phone]

## 2011-04-10 NOTE — ED Provider Notes (Addendum)
History     CSN: [id number]  Arrival date & time 04/09/11  2133   First MD Initiated Contact with Patient 04/09/11 2330      Chief Complaint  Patient presents with  . Fever  . Anxiety    HPI: Patient is a 46 y.o. female presenting with fever and anxiety. The history is provided by the patient.  Fever Primary symptoms of the febrile illness include cough and dysuria. Primary symptoms do not include fever. The current episode started more than 1 week ago. This is a recurrent problem. The problem has been gradually worsening.  Anxiety Associated symptoms include coughing. Pertinent negatives include no fever.   EMS reported they were called to nursing home or patient is a resident due to patient having an anxiety/panic attack. Patient was given 1 mg of Ativan and panic attack ultimately resolved. Patient requested transport to the emergency department to be evaluated for cough and persisting UTI symptoms. Patient reports that she's had a cough for 3 weeks. Thinks that she has had fever. Also reports that she was recently treated for a urinary tract infection, finished her antibiotic yesterday morning and states that she's still having symptoms.  Past Medical History  Diagnosis Date  . Sepsis   . Hypertension   . DVT (deep vein thrombosis) in pregnancy   . Depression     History reviewed. No pertinent past surgical history.  No family history on file.  History  Substance Use Topics  . Smoking status: Not on file  . Smokeless tobacco: Not on file  . Alcohol Use:     OB History    Grav Para Term Preterm Abortions TAB SAB Ect Mult Living                  Review of Systems  Constitutional: Negative for fever.  Respiratory: Positive for cough.   Genitourinary: Positive for dysuria.    Allergies  Sulfa antibiotics  Home Medications   Current Outpatient Rx  Name Route Sig Dispense Refill  . ASPIRIN EC 81 MG PO TBEC Oral Take 81 mg by mouth daily.    Marland Kitchen BACLOFEN 10 MG  PO TABS Oral Take 10 mg by mouth 3 (three) times daily.    . BECLOMETHASONE DIPROPIONATE 80 MCG/ACT IN AERS Inhalation Inhale 2 puffs into the lungs as needed.    Marland Kitchen VITAMIN D 400 UNITS PO CAPS Oral Take 400 Units by mouth daily.    Marland Kitchen CITALOPRAM HYDROBROMIDE 40 MG PO TABS Oral Take 40 mg by mouth at bedtime.    Marland Kitchen CLONAZEPAM 1 MG PO TABS Oral Take 1 mg by mouth 2 (two) times daily as needed.    Marland Kitchen FLUTICASONE PROPIONATE 50 MCG/ACT NA SUSP Nasal Place 2 sprays into the nose daily.    Marland Kitchen HYDROCODONE-ACETAMINOPHEN 10-325 MG PO TABS Oral Take 1 tablet by mouth every 6 (six) hours as needed.    . IPRATROPIUM BROMIDE HFA 17 MCG/ACT IN AERS Inhalation Inhale 2 puffs into the lungs every 6 (six) hours.    . IPRATROPIUM BROMIDE 0.02 % IN SOLN Nebulization Take 500 mcg by nebulization 4 (four) times daily.    Marland Kitchen KETOTIFEN FUMARATE 0.025 % OP SOLN Both Eyes Place 1 drop into both eyes 2 (two) times daily.    Marland Kitchen LAMOTRIGINE 100 MG PO TABS Oral Take 100 mg by mouth daily.    Marland Kitchen LEVETIRACETAM 250 MG PO TABS Oral Take 250 mg by mouth every 12 (twelve) hours.    Marland Kitchen LEVOTHYROXINE  SODIUM 75 MCG PO TABS Oral Take 75 mcg by mouth daily.    Marland Kitchen LORAZEPAM 1 MG PO TABS Oral Take 1 mg by mouth every 6 (six) hours as needed.    Marland Kitchen MONTELUKAST SODIUM 10 MG PO TABS Oral Take 10 mg by mouth at bedtime.    . CERTA-VITE PO Oral Take 1 tablet by mouth daily.    Marland Kitchen NITROGLYCERIN 0.4 MG SL SUBL Sublingual Place 0.4 mg under the tongue every 5 (five) minutes as needed.    Marland Kitchen OMEPRAZOLE 20 MG PO CPDR Oral Take 20 mg by mouth daily.    . OXYCODONE HCL 5 MG PO TABS Oral Take 5 mg by mouth 2 (two) times daily. Hold for sedation    . PRAVASTATIN SODIUM 40 MG PO TABS Oral Take 40 mg by mouth daily.    Marland Kitchen RISPERIDONE 2 MG PO TABS Oral Take 2 mg by mouth 2 (two) times daily.    . SODIUM CHLORIDE 0.65 % NA SOLN Nasal Place 1 spray into the nose 2 (two) times daily.    Marland Kitchen VITAMIN C 500 MG PO TABS Oral Take 500 mg by mouth 2 (two) times daily.    Marland Kitchen  ZOLPIDEM TARTRATE 5 MG PO TABS Oral Take 5 mg by mouth at bedtime as needed.      BP 130/66  Pulse 88  Temp(Src) 97.9 F (36.6 C) (Oral)  Resp 16  SpO2 97%  Physical Exam  Constitutional: She is oriented to person, place, and time. She appears well-developed and well-nourished.  HENT:  Head: Normocephalic and atraumatic.  Eyes: Conjunctivae are normal.  Neck: Neck supple.  Cardiovascular: Normal rate and regular rhythm.   Pulmonary/Chest: Effort normal and breath sounds normal.  Abdominal: Soft. Bowel sounds are normal.  Musculoskeletal: Normal range of motion.  Neurological: She is alert and oriented to person, place, and time.  Skin: Skin is warm and dry. No erythema.  Psychiatric: She has a normal mood and affect.    ED Course  Procedures  Patient noted sleeping soundly upon arrival to the room. States that she wants to be admitted to the hospital because she doesn't think she's getting proper care at the nursing home. Patient seen 03/25/2011 in ED and diagnosed with UTI. Was placed own Keflex. Urine culture done during that visit shows patient resistant to Keflex. Chest x-ray currently without acute findings. Will await Urine results and plan for discharge home.  Second catheterized urine sample continues to show findings consistent with urinary tract infection. Will plan to discharge patient back to the nursing home on Macrobid x 7 days and encourage follow up with primary care physician. I have discussed this plan with the patient who is agreeable.  Labs Reviewed  CBC - Abnormal; Notable for the following:    Platelets 147 (*)    All other components within normal limits  URINALYSIS, ROUTINE W REFLEX MICROSCOPIC - Abnormal; Notable for the following:    APPearance TURBID (*)    Hgb urine dipstick LARGE (*)    Protein, ur 30 (*)    Nitrite POSITIVE (*)    Leukocytes, UA LARGE (*)    All other components within normal limits  URINE MICROSCOPIC-ADD ON - Abnormal; Notable  for the following:    Squamous Epithelial / LPF MANY (*)    Bacteria, UA MANY (*)    All other components within normal limits  DIFFERENTIAL  BASIC METABOLIC PANEL  URINALYSIS, ROUTINE W REFLEX MICROSCOPIC  URINALYSIS, ROUTINE W REFLEX MICROSCOPIC  Dg Chest 2 View  04/10/2011  *RADIOLOGY REPORT*  Clinical Data: Cough  CHEST - 2 VIEW  Comparison: 03/25/2011  Findings: Interstitial prominence is nonspecific. No focal consolidation.  Heart size within normal limits. Vascular crowding. Otherwise mediastinal contours within normal limits.  No pleural effusion or pneumothorax. Nondiagnostic osseous evaluation.  IMPRESSION: Interstitial prominence, may be exaggerated by hypoaeration.  No focal consolidation.  Original Report Authenticated By: Waneta Martins, M.D.     No diagnosis found.    MDM  S/P anxiety/panic attack which resolved after medication given at [facility] HPI/PE and clinical findings c/w persistent UTI. Pt is afebrile, non-toxic in appearrance Treated originally w/ Keflex. Urine cx shows resistance to keflex. Will treat w/ Macrodantin.        Roma Kayser Aleric Froelich, NP 04/10/11 [id number]  Leanne Chang, NP 04/10/11 8295  Roma Kayser Amiel Sharrow, NP 04/25/11 6213

## 2011-04-26 NOTE — ED Provider Notes (Signed)
Medical screening examination/treatment/procedure(s) were performed by non-physician practitioner and as supervising physician I was immediately available for consultation/collaboration.  Demarco Bacci R. Launi Asencio, MD 04/26/11 1144 

## 2011-08-20 ENCOUNTER — Emergency Department (HOSPITAL_COMMUNITY)
Admission: EM | Admit: 2011-08-20 | Discharge: 2011-08-21 | Disposition: A | Discharge status: Home / Self Care | Payer: Medicare Other | Attending: Emergency Medicine | Admitting: Emergency Medicine

## 2011-08-20 ENCOUNTER — Emergency Department (HOSPITAL_COMMUNITY): Payer: Medicare Other

## 2011-08-20 ENCOUNTER — Encounter (HOSPITAL_COMMUNITY): Payer: Self-pay

## 2011-08-20 DIAGNOSIS — K5730 Diverticulosis of large intestine without perforation or abscess without bleeding: Secondary | ICD-10-CM | POA: Insufficient documentation

## 2011-08-20 DIAGNOSIS — I10 Essential (primary) hypertension: Secondary | ICD-10-CM | POA: Insufficient documentation

## 2011-08-20 DIAGNOSIS — R197 Diarrhea, unspecified: Secondary | ICD-10-CM | POA: Insufficient documentation

## 2011-08-20 DIAGNOSIS — N200 Calculus of kidney: Secondary | ICD-10-CM | POA: Insufficient documentation

## 2011-08-20 DIAGNOSIS — N390 Urinary tract infection, site not specified: Secondary | ICD-10-CM | POA: Insufficient documentation

## 2011-08-20 DIAGNOSIS — R109 Unspecified abdominal pain: Secondary | ICD-10-CM | POA: Insufficient documentation

## 2011-08-20 DIAGNOSIS — N1330 Unspecified hydronephrosis: Secondary | ICD-10-CM | POA: Insufficient documentation

## 2011-08-20 HISTORY — DX: Acute respiratory failure, unspecified whether with hypoxia or hypercapnia: J96.00

## 2011-08-20 HISTORY — DX: Pneumonia, unspecified organism: J18.9

## 2011-08-20 HISTORY — DX: Cerebral palsy, unspecified: G80.9

## 2011-08-20 HISTORY — DX: Dysphagia, unspecified: R13.10

## 2011-08-20 HISTORY — DX: Migraine, unspecified, not intractable, without status migrainosus: G43.909

## 2011-08-20 HISTORY — DX: Diaphragmatic hernia without obstruction or gangrene: K44.9

## 2011-08-20 HISTORY — DX: Morbid (severe) obesity due to excess calories: E66.01

## 2011-08-20 HISTORY — DX: Epilepsy, unspecified, not intractable, without status epilepticus: G40.909

## 2011-08-20 HISTORY — DX: Chronic pain syndrome: G89.4

## 2011-08-20 HISTORY — DX: Chronic obstructive pulmonary disease, unspecified: J44.9

## 2011-08-20 HISTORY — DX: Allergic rhinitis, unspecified: J30.9

## 2011-08-20 LAB — COMPREHENSIVE METABOLIC PANEL
ALT: 32 U/L (ref 0–35)
AST: 21 U/L (ref 0–37)
Albumin: 4.2 g/dL (ref 3.5–5.2)
Alkaline Phosphatase: 49 U/L (ref 39–117)
BUN: 9 mg/dL (ref 6–23)
CO2: 28 mEq/L (ref 19–32)
Calcium: 10.1 mg/dL (ref 8.4–10.5)
Chloride: 99 mEq/L (ref 96–112)
Creatinine, Ser: 0.54 mg/dL (ref 0.50–1.10)
GFR calc Af Amer: >90 mL/min (ref >90)
GFR calc non Af Amer: >90 mL/min (ref >90)
Glucose, Bld: 87 mg/dL (ref 70–99)
Potassium: 4.1 mEq/L (ref 3.5–5.1)
Sodium: 138 mEq/L (ref 135–145)
Total Bilirubin: 0.3 mg/dL (ref 0.3–1.2)
Total Protein: 6.8 g/dL (ref 6.0–8.3)

## 2011-08-20 LAB — CBC
HCT: 41 % (ref 36.0–46.0)
Hemoglobin: 13.7 g/dL (ref 12.0–15.0)
MCH: 30.5 pg (ref 26.0–34.0)
MCHC: 33.4 g/dL (ref 30.0–36.0)
MCV: 91.3 fL (ref 78.0–100.0)
Platelets: 165 10*3/uL (ref 150–400)
RBC: 4.49 MIL/uL (ref 3.87–5.11)
RDW: 13.1 % (ref 11.5–15.5)
WBC: 7 10*3/uL (ref 4.0–10.5)

## 2011-08-20 LAB — URINALYSIS, ROUTINE W REFLEX MICROSCOPIC
Bilirubin Urine: NEGATIVE
Glucose, UA: NEGATIVE mg/dL
Ketones, ur: NEGATIVE mg/dL
Nitrite: POSITIVE — AB
Protein, ur: NEGATIVE mg/dL
Specific Gravity, Urine: 1.011 (ref 1.005–1.030)
Urobilinogen, UA: 0.2 mg/dL (ref 0.0–1.0)
pH: 6 (ref 5.0–8.0)

## 2011-08-20 LAB — DIFFERENTIAL
Basophils Absolute: 0 10*3/uL (ref 0.0–0.1)
Basophils Relative: 0 % (ref 0–1)
Eosinophils Absolute: 0.2 10*3/uL (ref 0.0–0.7)
Eosinophils Relative: 3 % (ref 0–5)
Lymphocytes Relative: 31 % (ref 12–46)
Lymphs Abs: 2.2 10*3/uL (ref 0.7–4.0)
Monocytes Absolute: 0.7 10*3/uL (ref 0.1–1.0)
Monocytes Relative: 9 % (ref 3–12)
Neutro Abs: 4 10*3/uL (ref 1.7–7.7)
Neutrophils Relative %: 56 % (ref 43–77)

## 2011-08-20 LAB — LIPASE, BLOOD: Lipase: 35 U/L (ref 11–59)

## 2011-08-20 LAB — URINE MICROSCOPIC-ADD ON

## 2011-08-20 MED ORDER — ONDANSETRON HCL 4 MG/2ML IJ SOLN
4.0000 mg | Freq: Once | INTRAMUSCULAR | Status: AC
  Administered 2011-08-20: 4 mg via INTRAVENOUS
  Filled 2011-08-20: qty 2

## 2011-08-20 MED ORDER — IOHEXOL 300 MG/ML  SOLN
20.0000 mL | INTRAMUSCULAR | Status: AC
  Administered 2011-08-20: 20 mL via ORAL

## 2011-08-20 MED ORDER — IOHEXOL 300 MG/ML  SOLN
80.0000 mL | Freq: Once | INTRAMUSCULAR | Status: AC | PRN
  Administered 2011-08-20: 80 mL via INTRAVENOUS

## 2011-08-20 MED ORDER — MORPHINE SULFATE 4 MG/ML IJ SOLN
4.0000 mg | Freq: Once | INTRAMUSCULAR | Status: AC
  Administered 2011-08-20: 4 mg via INTRAVENOUS
  Filled 2011-08-20: qty 1

## 2011-08-20 NOTE — ED Notes (Signed)
Pt completed contrast

## 2011-08-20 NOTE — ED Notes (Signed)
Pt is complaining of abdominal pain and hand pain.

## 2011-08-20 NOTE — ED Notes (Signed)
Pt c/o cp sharp chest pain sharp in nature increases with inspiration. Abdominal pain in luq burning in nature. "arthritis" pain in hands chronic requesting splints for the pain.

## 2011-08-20 NOTE — ED Notes (Signed)
Pt was brought in by ambulance with complaint of abdominal pain with diarrhea x 1 week, chest pain today. Pt was given ASA and NTG x 1 at the [facility] with little relief. Pt is alert, awake .Skin is warm and dry, respiration is even and unlabored.

## 2011-08-21 MED ORDER — CIPROFLOXACIN HCL 500 MG PO TABS: 500.0000 mg | ORAL_TABLET | Freq: Two times a day (BID) | ORAL | Status: AC

## 2011-08-21 MED ORDER — CIPROFLOXACIN IN D5W 400 MG/200ML IV SOLN
400.0000 mg | Freq: Once | INTRAVENOUS | Status: AC
  Administered 2011-08-21: 400 mg via INTRAVENOUS
  Filled 2011-08-21: qty 200

## 2011-08-21 MED ORDER — MORPHINE SULFATE 4 MG/ML IJ SOLN
6.0000 mg | Freq: Once | INTRAMUSCULAR | Status: AC
  Administered 2011-08-21: 6 mg via INTRAVENOUS
  Filled 2011-08-21: qty 2

## 2011-08-21 NOTE — ED Provider Notes (Signed)
History     CSN: [id number]  Arrival date & time 08/20/11  Kristi Perry   First MD Initiated Contact with Patient 08/20/11 2022      Chief Complaint  Patient presents with  . Chest Pain  . Abdominal Pain    (Consider location/radiation/quality/duration/timing/severity/associated sxs/prior treatment) HPI Patient presents emergency department with left-sided abdominal pain for 1 week, states she has had some diarrhea with this as well.  Patient is very good historian, but does state, that she has not had any recent antibiotic usage.  Patient denies chest pain, to me, shortness of breath nausea, vomiting, fever, back pain, dizziness or weakness.  Patient, states he did not take anything prior to arrival for her discomfort Past Medical History  Diagnosis Date  . Sepsis   . Hypertension   . DVT (deep vein thrombosis) in pregnancy   . Depression   . Pneumonia   . Chronic pain syndrome   . Allergic rhinitis   . Acute respiratory failure   . Epilepsy   . Migraine   . Chronic airway obstruction   . Cerebral palsy   . Dysphagia   . Hiatal hernia   . Morbid obesity     History reviewed. No pertinent past surgical history.  No family history on file.  History  Substance Use Topics  . Smoking status: Never Smoker   . Smokeless tobacco: Not on file  . Alcohol Use: No    OB History    Grav Para Term Preterm Abortions TAB SAB Ect Mult Living                  Review of Systems All other systems negative except as documented in the HPI. All pertinent positives and negatives as reviewed in the HPI.  Allergies  Sulfa antibiotics  Home Medications   Current Outpatient Rx  Name Route Sig Dispense Refill  . ACETAMINOPHEN 500 MG PO TABS Oral Take 1,000 mg by mouth 2 (two) times daily.    . ALBUTEROL SULFATE HFA 108 (90 BASE) MCG/ACT IN AERS Inhalation Inhale 2 puffs into the lungs every 4 (four) hours as needed. For wheezing or shortness of breath    . ASPIRIN 81 MG PO CHEW Oral  Chew 81 mg by mouth daily.    Marland Kitchen BACLOFEN 10 MG PO TABS Oral Take 10 mg by mouth 4 (four) times daily.     . BUDESONIDE-FORMOTEROL FUMARATE 160-4.5 MCG/ACT IN AERO Inhalation Inhale 1 puff into the lungs 2 (two) times daily.    Marland Kitchen CETIRIZINE HCL 10 MG PO TABS Oral Take 10 mg by mouth daily.    Marland Kitchen VITAMIN D 400 UNITS PO CAPS Oral Take 400 Units by mouth daily.    Marland Kitchen CITALOPRAM HYDROBROMIDE 40 MG PO TABS Oral Take 40 mg by mouth at bedtime.    Marland Kitchen CLONAZEPAM 1 MG PO TABS Oral Take 1 mg by mouth 2 (two) times daily as needed.    Marland Kitchen FEXOFENADINE HCL 60 MG PO TABS Oral Take 60 mg by mouth 2 (two) times daily.    Marland Kitchen FLUTICASONE PROPIONATE 50 MCG/ACT NA SUSP Nasal Place 2 sprays into the nose daily.    Marland Kitchen HYDROCODONE-ACETAMINOPHEN 10-325 MG PO TABS Oral Take 1 tablet by mouth every 6 (six) hours as needed. For pain    . KETOTIFEN FUMARATE 0.025 % OP SOLN Both Eyes Place 1 drop into both eyes 2 (two) times daily.    Marland Kitchen LAMOTRIGINE 100 MG PO TABS Oral Take 100 mg by mouth  2 (two) times daily.     Marland Kitchen LEVETIRACETAM 250 MG PO TABS Oral Take 250 mg by mouth every 12 (twelve) hours.    Marland Kitchen LEVOTHYROXINE SODIUM 25 MCG PO TABS Oral Take 25 mcg by mouth daily.    Marland Kitchen LORAZEPAM 1 MG PO TABS Oral Take 1 mg by mouth every 4 (four) hours as needed. For agitation    . MONTELUKAST SODIUM 10 MG PO TABS Oral Take 10 mg by mouth every morning.     . CERTA-VITE PO Oral Take 1 tablet by mouth daily.    Marland Kitchen NITROGLYCERIN 0.4 MG SL SUBL Sublingual Place 0.4 mg under the tongue every 5 (five) minutes as needed. For chest pain    . OMEPRAZOLE 20 MG PO CPDR Oral Take 20 mg by mouth daily.    . OXYCODONE HCL 5 MG PO TABS Oral Take 5 mg by mouth 2 (two) times daily. Hold for sedation    . PRAVASTATIN SODIUM 40 MG PO TABS Oral Take 40 mg by mouth daily.    Marland Kitchen RISPERIDONE 2 MG PO TABS Oral Take 2 mg by mouth 2 (two) times daily.    Marland Kitchen RISPERIDONE MICROSPHERES 25 MG IM SUSR Intramuscular Inject 25 mg into the muscle every 14 (fourteen) days.    .  SODIUM CHLORIDE 0.65 % NA SOLN Nasal Place 1 spray into the nose 2 (two) times daily.    Marland Kitchen VITAMIN C 500 MG PO TABS Oral Take 500 mg by mouth 2 (two) times daily.      BP 136/60  Pulse 81  Temp 98.7 F (37.1 C) (Oral)  Resp 18  Ht 5\' 3"  (1.6 m)  Wt 204 lb (92.534 kg)  BMI 36.14 kg/m2  SpO2 93%  Physical Exam  Nursing note and vitals reviewed. Constitutional: She is oriented to person, place, and time. She appears well-developed and well-nourished. No distress.  HENT:  Head: Normocephalic.  Cardiovascular: Normal rate and regular rhythm.   Pulmonary/Chest: Effort normal and breath sounds normal. No respiratory distress.  Abdominal: Soft. Bowel sounds are normal. She exhibits no distension. There is tenderness. There is no rebound and no guarding.    Neurological: She is oriented to person, place, and time.    ED Course  Procedures (including critical care time)  Labs Reviewed  URINALYSIS, ROUTINE W REFLEX MICROSCOPIC - Abnormal; Notable for the following:    APPearance CLOUDY (*)     Hgb urine dipstick MODERATE (*)     Nitrite POSITIVE (*)     Leukocytes, UA LARGE (*)     All other components within normal limits  URINE MICROSCOPIC-ADD ON - Abnormal; Notable for the following:    Squamous Epithelial / LPF MANY (*)     Bacteria, UA MANY (*)     All other components within normal limits  LIPASE, BLOOD  CBC  DIFFERENTIAL  COMPREHENSIVE METABOLIC PANEL  URINE CULTURE   Ct Abdomen Pelvis W Contrast  08/20/2011  *RADIOLOGY REPORT*  Clinical Data: Severe abdominal pain and diarrhea.  CT ABDOMEN AND PELVIS WITH CONTRAST  Technique:  Multidetector CT imaging of the abdomen and pelvis was performed following the standard protocol during bolus administration of intravenous contrast.  Contrast: 80mL OMNIPAQUE IOHEXOL 300 MG/ML  SOLN  Comparison: Abdominal radiograph performed 04/15/2010  Findings: The visualized lung bases are clear.  The liver and spleen are unremarkable in  appearance.  The gallbladder is within normal limits.  The pancreas and adrenal glands are unremarkable.  Relatively chronic mild right-sided  hydronephrosis is noted, with mild right-sided staghorn calculus formation.  As a whole, right renal calcifications measure approximately 4.5 cm in length.  The stone at the right renal pelvis measures 1.7 cm in size.  Mild right renal scarring is noted.  A small amount of air is noted within the right renal calyces; though there is minimal if any abnormality in right renal enhancement, emphysematous pyelonephritis cannot be entirely excluded.  Alternatively, would correlate clinically for recent Foley catheter placement.  The left kidney is grossly unremarkable in appearance.  No perinephric stranding is appreciated.  No free fluid is identified.  The small bowel is unremarkable in appearance.  The stomach is within normal limits.  No acute vascular abnormalities are seen.  The appendix is normal in caliber, without evidence for appendicitis.  Mild scattered diverticulosis is noted along the transverse, descending and sigmoid colon; the sigmoid colon is mildly redundant.  The colon is otherwise grossly unremarkable in appearance.  The bladder is mildly distended; trace air is noted within the bladder.  The uterus is grossly unremarkable in appearance.  The ovaries are relatively symmetric; no suspicious adnexal masses are seen.  No inguinal lymphadenopathy is seen.  Prominent superficial venous collaterals are incidentally noted about the lower pelvis.  No acute osseous abnormalities are identified.  IMPRESSION:  1.  Small amount of air noted within the right renal calyces. Though there is minimal if any abnormality in right renal enhancement, emphysematous pyelonephritis cannot be entirely excluded.  Trace associated air noted within the bladder; alternatively, would correlate clinically for recent Foley catheter placement. 2.  Relatively chronic mild right-sided  hydronephrosis, with mild right-sided staghorn calculus formation, with calcifications measuring 4.5 cm in length.  The stone at the right renal pelvis measures 1.7 cm in size. 3.  Mild right renal scarring seen. 4.  Scattered diverticulosis along the transverse, descending and sigmoid colon.  No evidence of diverticulitis. 5.  Prominent superficial venous collaterals incidentally noted about the lower pelvis.  Original Report Authenticated By: Tonia Ghent, M.D.     She will be treated for urinary tract infection.  Based on her findings here tonight.  She will be advised to followup with her primary care Dr. for further evaluation and recheck.  Patient has been stable here in the emergency department.    MDM  MDM Reviewed: nursing note and vitals Interpretation: labs and CT scan            Carlyle Dolly, PA-C 08/21/11 0141  Carlyle Dolly, PA-C 08/21/11 0159

## 2011-08-21 NOTE — Discharge Instructions (Signed)
Return here as needed.  Followup with your primary care Dr. for recheck.  Increase your fluid intake °

## 2011-08-23 LAB — URINE CULTURE
Colony Count: >=100000
Culture  Setup Time: 201306190819

## 2011-08-23 NOTE — ED Provider Notes (Signed)
Medical screening examination/treatment/procedure(s) were performed by non-physician practitioner and as supervising physician I was immediately available for consultation/collaboration.   Jo-Anne Kluth, MD 08/23/11 1612 

## 2011-08-23 NOTE — ED Notes (Signed)
Urine Culture results faxed to  [facility] at [phone], atten: Bjorn Loser RN

## 2011-09-13 ENCOUNTER — Other Ambulatory Visit: Payer: Self-pay | Admitting: Urology

## 2011-09-13 DIAGNOSIS — N200 Calculus of kidney: Secondary | ICD-10-CM

## 2011-09-25 ENCOUNTER — Encounter (HOSPITAL_COMMUNITY): Payer: Self-pay | Admitting: *Deleted

## 2011-09-25 DIAGNOSIS — R32 Unspecified urinary incontinence: Secondary | ICD-10-CM

## 2011-09-25 DIAGNOSIS — R159 Full incontinence of feces: Secondary | ICD-10-CM

## 2011-09-25 HISTORY — DX: Morbid (severe) obesity due to excess calories: E66.01

## 2011-09-25 HISTORY — DX: Full incontinence of feces: R15.9

## 2011-09-25 HISTORY — DX: Unspecified urinary incontinence: R32

## 2011-09-26 ENCOUNTER — Other Ambulatory Visit: Payer: Self-pay | Admitting: Radiology

## 2011-10-01 ENCOUNTER — Other Ambulatory Visit: Payer: Self-pay | Admitting: Urology

## 2011-10-01 ENCOUNTER — Ambulatory Visit (HOSPITAL_COMMUNITY)
Admission: RE | Admit: 2011-10-01 | Discharge: 2011-10-01 | Disposition: A | Discharge status: Home / Self Care | Payer: Medicare Other | Source: Ambulatory Visit | Attending: Urology | Admitting: Urology

## 2011-10-01 DIAGNOSIS — N200 Calculus of kidney: Secondary | ICD-10-CM | POA: Insufficient documentation

## 2011-10-01 DIAGNOSIS — N390 Urinary tract infection, site not specified: Secondary | ICD-10-CM | POA: Insufficient documentation

## 2011-10-01 LAB — CBC WITH DIFFERENTIAL/PLATELET
Eosinophils Absolute: 0.3 10*3/uL (ref 0.0–0.7)
Eosinophils Relative: 4 % (ref 0–5)
Lymphs Abs: 1.5 10*3/uL (ref 0.7–4.0)
MCH: 29.4 pg (ref 26.0–34.0)
MCHC: 32.6 g/dL (ref 30.0–36.0)
MCV: 90.1 fL (ref 78.0–100.0)
Platelets: 157 10*3/uL (ref 150–400)
RBC: 4.56 MIL/uL (ref 3.87–5.11)
RDW: 13.3 % (ref 11.5–15.5)

## 2011-10-01 LAB — BASIC METABOLIC PANEL
BUN: 16 mg/dL (ref 6–23)
CO2: 27 mEq/L (ref 19–32)
Calcium: 9.5 mg/dL (ref 8.4–10.5)
Creatinine, Ser: 0.43 mg/dL — ABNORMAL LOW (ref 0.50–1.10)
Glucose, Bld: 94 mg/dL (ref 70–99)

## 2011-10-01 LAB — PROTIME-INR: Prothrombin Time: 13.1 seconds (ref 11.6–15.2)

## 2011-10-01 MED ORDER — MIDAZOLAM HCL 5 MG/5ML IJ SOLN
INTRAMUSCULAR | Status: AC | PRN
  Administered 2011-10-01 (×3): 1 mg via INTRAVENOUS

## 2011-10-01 MED ORDER — MIDAZOLAM HCL 2 MG/2ML IJ SOLN
INTRAMUSCULAR | Status: AC
  Filled 2011-10-01: qty 6

## 2011-10-01 MED ORDER — FENTANYL CITRATE 0.05 MG/ML IJ SOLN
INTRAMUSCULAR | Status: AC
  Filled 2011-10-01: qty 6

## 2011-10-01 MED ORDER — SODIUM CHLORIDE 0.9 % IV SOLN: Freq: Once | INTRAVENOUS | Status: DC

## 2011-10-01 MED ORDER — LIDOCAINE HCL 1 % IJ SOLN
INTRAMUSCULAR | Status: AC
  Filled 2011-10-01: qty 20

## 2011-10-01 MED ORDER — FENTANYL CITRATE 0.05 MG/ML IJ SOLN
INTRAMUSCULAR | Status: AC
  Filled 2011-10-01: qty 2

## 2011-10-01 MED ORDER — PIPERACILLIN-TAZOBACTAM IN DEX 2-0.25 GM/50ML IV SOLN
2.2500 g | Freq: Once | INTRAVENOUS | Status: AC
  Administered 2011-10-01: 2.25 g via INTRAVENOUS
  Filled 2011-10-01: qty 50

## 2011-10-01 MED ORDER — FENTANYL CITRATE 0.05 MG/ML IJ SOLN
INTRAMUSCULAR | Status: AC | PRN
  Administered 2011-10-01 (×4): 50 ug via INTRAVENOUS

## 2011-10-01 MED ORDER — HYDROCODONE-ACETAMINOPHEN 5-325 MG PO TABS
1.0000 | ORAL_TABLET | ORAL | Status: DC | PRN
  Administered 2011-10-01: 1 via ORAL
  Filled 2011-10-01: qty 1

## 2011-10-01 MED ORDER — CIPROFLOXACIN IN D5W 400 MG/200ML IV SOLN: 400.0000 mg | Freq: Once | INTRAVENOUS | Status: DC

## 2011-10-01 MED ORDER — IOHEXOL 300 MG/ML  SOLN
10.0000 mL | Freq: Once | INTRAMUSCULAR | Status: AC | PRN
  Administered 2011-10-01: 10 mL

## 2011-10-01 NOTE — Procedures (Signed)
Procedure:  Right arm PICC line Findings:  Right basilic v access.  5Fr DL PICC, 40 cm length placed with tip at cavoatrial junction. OK to use.

## 2011-10-01 NOTE — Progress Notes (Signed)
Report received from [facility] (IR). States Jen in IR has called to [employer] and given report

## 2011-10-01 NOTE — H&P (Signed)
Kristi Perry is an 46 y.o. female.   Chief Complaint: right kidney stone  HPI: Patient with history of right staghorn calculus/mild right hydronephrosis presents today for placement of a right percutaneous nephrostomy tube and PICC line prior to planned nephrolithotomy next week.  Past Medical History  Diagnosis Date  . Sepsis   . Hypertension   . DVT (deep vein thrombosis) in pregnancy   . Depression   . Pneumonia   . Chronic pain syndrome   . Allergic rhinitis   . Acute respiratory failure   . Epilepsy   . Migraine   . Chronic airway obstruction   . Cerebral palsy   . Dysphagia   . Hiatal hernia   . Morbid obesity 09-25-11    requires Hoyer lift. pt. doesn't stand or ambulate.  . Incontinent of urine 09-25-11    hx.  . Incontinent of feces 09-25-11    hx.    No past surgical history on file.  No family history on file. Social History:  reports that she has never smoked. She does not have any smokeless tobacco history on file. She reports that she does not drink alcohol or use illicit drugs.  Allergies:  Allergies  Allergen Reactions  . Sulfa Antibiotics Hives    Current outpatient prescriptions:acetaminophen (TYLENOL) 500 MG tablet, Take 1,000 mg by mouth 2 (two) times daily., Disp: , Rfl: ;  albuterol (PROVENTIL HFA;VENTOLIN HFA) 108 (90 BASE) MCG/ACT inhaler, Inhale 2 puffs into the lungs every 4 (four) hours as needed. For wheezing or shortness of breath, Disp: , Rfl: ;  aspirin 81 MG chewable tablet, Chew 81 mg by mouth daily., Disp: , Rfl:  baclofen (LIORESAL) 10 MG tablet, Take 10 mg by mouth 4 (four) times daily. , Disp: , Rfl: ;  budesonide-formoterol (SYMBICORT) 160-4.5 MCG/ACT inhaler, Inhale 1 puff into the lungs 2 (two) times daily., Disp: , Rfl: ;  cetirizine (ZYRTEC) 10 MG tablet, Take 10 mg by mouth daily., Disp: , Rfl: ;  Cholecalciferol (VITAMIN D) 400 UNITS capsule, Take 400 Units by mouth daily., Disp: , Rfl:  citalopram (CELEXA) 40 MG tablet, Take 40 mg  by mouth at bedtime., Disp: , Rfl: ;  clonazePAM (KLONOPIN) 1 MG tablet, Take 1 mg by mouth 2 (two) times daily as needed., Disp: , Rfl: ;  fexofenadine (ALLEGRA) 60 MG tablet, Take 60 mg by mouth 2 (two) times daily., Disp: , Rfl: ;  fluticasone (FLONASE) 50 MCG/ACT nasal spray, Place 2 sprays into the nose daily., Disp: , Rfl:  HYDROcodone-acetaminophen (NORCO) 10-325 MG per tablet, Take 1 tablet by mouth every 6 (six) hours as needed. For pain, Disp: , Rfl: ;  ketotifen (ZADITOR) 0.025 % ophthalmic solution, Place 1 drop into both eyes 2 (two) times daily., Disp: , Rfl: ;  lamoTRIgine (LAMICTAL) 100 MG tablet, Take 100 mg by mouth 2 (two) times daily. , Disp: , Rfl: ;  levETIRAcetam (KEPPRA) 250 MG tablet, Take 250 mg by mouth every 12 (twelve) hours., Disp: , Rfl:  levothyroxine (SYNTHROID, LEVOTHROID) 25 MCG tablet, Take 25 mcg by mouth daily., Disp: , Rfl: ;  LORazepam (ATIVAN) 1 MG tablet, Take 1 mg by mouth every 4 (four) hours as needed. For agitation, Disp: , Rfl: ;  montelukast (SINGULAIR) 10 MG tablet, Take 10 mg by mouth every morning. , Disp: , Rfl: ;  Multiple Vitamins-Minerals (CERTA-VITE PO), Take 1 tablet by mouth daily., Disp: , Rfl:  nitroGLYCERIN (NITROSTAT) 0.4 MG SL tablet, Place 0.4 mg under the  tongue every 5 (five) minutes as needed. For chest pain, Disp: , Rfl: ;  omeprazole (PRILOSEC) 20 MG capsule, Take 20 mg by mouth daily., Disp: , Rfl: ;  oxyCODONE (OXY IR/ROXICODONE) 5 MG immediate release tablet, Take 5 mg by mouth 2 (two) times daily. Hold for sedation, Disp: , Rfl: ;  risperiDONE (RISPERDAL) 2 MG tablet, Take 2 mg by mouth 2 (two) times daily., Disp: , Rfl:  risperiDONE microspheres (RISPERDAL CONSTA) 25 MG injection, Inject 25 mg into the muscle every 14 (fourteen) days., Disp: , Rfl: ;  sodium chloride (OCEAN) 0.65 % nasal spray, Place 1 spray into the nose 2 (two) times daily., Disp: , Rfl: ;  vitamin C (ASCORBIC ACID) 500 MG tablet, Take 500 mg by mouth 2 (two) times  daily., Disp: , Rfl: ;  pravastatin (PRAVACHOL) 40 MG tablet, Take 40 mg by mouth daily., Disp: , Rfl:  Current facility-administered medications:0.9 %  sodium chloride infusion, , Intravenous, Once, [employer], PA;  piperacillin-tazobactam (ZOSYN) IVPB 2.25 g, 2.25 g, Intravenous, Once, D Jeananne Rama, PA;  DISCONTD: ciprofloxacin (CIPRO) IVPB 400 mg, 400 mg, Intravenous, Once, Brayton El, PA   Results for orders placed during the hospital encounter of 10/01/11 (from the past 48 hour(s))  CBC WITH DIFFERENTIAL     Status: Normal   Collection Time   10/01/11  9:14 AM      Component Value Range Comment   WBC 6.4  4.0 - 10.5 K/uL    RBC 4.56  3.87 - 5.11 MIL/uL    Hemoglobin 13.4  12.0 - 15.0 g/dL    HCT 16.1  09.6 - 04.5 %    MCV 90.1  78.0 - 100.0 fL    MCH 29.4  26.0 - 34.0 pg    MCHC 32.6  30.0 - 36.0 g/dL    RDW 40.9  81.1 - 91.4 %    Platelets 157  150 - 400 K/uL    Neutrophils Relative 63  43 - 77 %    Neutro Abs 4.1  1.7 - 7.7 K/uL    Lymphocytes Relative 23  12 - 46 %    Lymphs Abs 1.5  0.7 - 4.0 K/uL    Monocytes Relative 10  3 - 12 %    Monocytes Absolute 0.6  0.1 - 1.0 K/uL    Eosinophils Relative 4  0 - 5 %    Eosinophils Absolute 0.3  0.0 - 0.7 K/uL    Basophils Relative 0  0 - 1 %    Basophils Absolute 0.0  0.0 - 0.1 K/uL    No results found.  Review of Systems  Constitutional: Positive for fever, chills and malaise/fatigue.  Respiratory: Positive for shortness of breath.        Occ cough  Cardiovascular: Negative for chest pain.  Gastrointestinal: Positive for abdominal pain. Negative for vomiting.       Intermittent nausea  Genitourinary: Positive for flank pain. Negative for dysuria and hematuria.  Musculoskeletal: Positive for back pain.  Neurological: Positive for headaches.  Endo/Heme/Allergies: Does not bruise/bleed easily.   Vitals:   BP 125/61  HR  92  TEMP 99.1   O2 SATS  92% RA Physical Exam  Constitutional: She is oriented to person, place,  and time. She appears well-developed and well-nourished.  Cardiovascular: Normal rate and regular rhythm.   Respiratory: Effort normal. She has no wheezes.       Distant BS throughout  GI: Soft. There is tenderness.  Morbidly obese  Neurological: She is alert and oriented to person, place, and time.     Assessment/Plan Patient with history of right staghorn calculus/mild right hydronephrosis; plan is for right percutaneous nephrostomy tube/PICC placement today. Details/risks of procedures d/w pt/pt's mother with their understanding and consent.  ALLRED,D KEVIN 10/01/2011, 9:29 AM

## 2011-10-01 NOTE — Procedures (Signed)
Procedure:  Right ureteral catheter insertion via percutaneous access. Findings:  Staghorn/partial staghorn of LP and renal pelvis.  After LP access, 5 Fr Glidecath advanced into bladder.  Access secured and catheter capped.

## 2011-10-01 NOTE — H&P (Signed)
Agree 

## 2011-10-03 ENCOUNTER — Encounter (HOSPITAL_COMMUNITY): Payer: Self-pay | Admitting: *Deleted

## 2011-10-03 ENCOUNTER — Encounter (HOSPITAL_COMMUNITY): Payer: Self-pay | Admitting: Pharmacy Technician

## 2011-10-04 NOTE — Progress Notes (Signed)
10/04/11 Spoke with Bjorn Loser, nurse for patient and she stated she had received preop instruction sheet for 10/07/11.  Nurse Bjorn Loser , stated she is currently still taking all medications on preop instruction sheet that she is to take am of surgery.   I asked Bjorn Loser again regarding health care poa .  Bjorn Loser states she will fax and patient will be transported by ambulance.

## 2011-10-04 NOTE — Progress Notes (Signed)
10/03/11 Received medication list and health history information from [facility] on 10/02/11.  Faxed to them copy of preop instructions and spoke with Bjorn Loser, nurse that nurse was faxing preop instructions.   Merleen Nicely, RN spoke with anesthesia regarding Keppra and Lamictal due to dosages are every 12 hours on nursing home medication record.  Per Dr Leta Jungling patient should take Keppra and Lamictal prior to arrival on 10/07/11 .   10/04/11 Bjorn Loser, nurse at [facility] , stated she did not receive preop instruction sheet.  Refaxed on 10/04/11.   10/03/11 Spoke with mother Larene Beach Ricke on 10/03/11.  Mother states she has health care power of attorney .  Phone consent obtained over phone by 2 RNs.   10/03/11 Requested by fax for Cheyenne Adas to send health care poa so that it could be placed on chart.  10/04/11 Rerequested by fax for Cheyenne Adas to send health care poa so that it could be placed on chart.

## 2011-10-06 MED ORDER — GENTAMICIN SULFATE 40 MG/ML IJ SOLN
460.0000 mg | INTRAVENOUS | Status: AC
  Administered 2011-10-07: 460 mg via INTRAVENOUS
  Filled 2011-10-06 (×2): qty 11.5

## 2011-10-06 NOTE — H&P (Signed)
H&P  Chief Complaint: Kidney stones  History of Present Illness: Kristi Perry is a 46 y.o. year old female who presents for right PCNL. Her history is as follows:   Marland Kitchen The patient appears to have been sent to the emergency room several weeks ago for flank pain and UTI. At that time she did have an abnormal urine and a positive urine culture for E. coli. It appeared that this was resistant to most oral antibiotics.CT imaging showed some mild chronic obstruction of the right renal unit. She has multiple stones and a partial staghorn involving the renal pelvis and lower pole. I have carefully reviewed her imaging studies and also her renal blood work from the hospital. In reviewing her medication list, she indeed has been on Invanz 1 gram intramuscularly x10 days starting back in late June. The patient has remained afebrile. She continues to complain of some right-sided abdominal and flank discomfort. She most recently has been under the care of Dr. Marcine Matar (in 2006).The patient did have a normal white blood cell count when she was in the emergency room.  In the office she was found to have a uti and multiple right renal calculi.  She has since had a right PC tube placed in [facility] IR, as well as a PICC line and has been receiving IV gentamicin. She presents for right PCNL.       Past Medical History  Diagnosis Date  . Sepsis   . Hypertension   . DVT (deep vein thrombosis) in pregnancy   . Depression   . Chronic pain syndrome   . Allergic rhinitis   . Acute respiratory failure   . Epilepsy   . Migraine   . Chronic airway obstruction   . Cerebral palsy   . Dysphagia   . Hiatal hernia   . Morbid obesity 09-25-11    requires Hoyer lift. pt. doesn't stand or ambulate.  . Incontinent of urine 09-25-11    hx.  . Incontinent of feces 09-25-11    hx.  . Vitamin d deficiency   . Sepsis     hx of   . Hyperlipidemia   . Mild intellectual disabilities   . Neurogenic bladder   .  Hypothyroidism   . Anxiety   . Unspecified psychosis   . Muscle weakness   . Dysphasia   . Pneumonia     hx of asp pneumonia 1/11-1/19/12  . Sacral decubitus ulcer     hx of    No past surgical history on file.  Home Medications:  No prescriptions prior to admission    Allergies:  Allergies  Allergen Reactions  . Sulfa Antibiotics Hives    No family history on file.  Social History:  reports that she has never smoked. She does not have any smokeless tobacco history on file. She reports that she does not drink alcohol or use illicit drugs.  ROS:  Genitourinary: incontinence, but no dysuria and no hematuria.  Gastrointestinal: flank pain and abdominal pain.  Constitutional: feeling tired (fatigue), but no fever.   Physical Exam:  Vital signs in last 24 hours: General: The patient is in no acute distress. She is obese HEENT: Poor dentition, otherwise normal Neck: Supple without mass Chest: Clear to auscultation bilaterally Heart: Regular rate and rhythm Abdomen: Obese, no significant tenderness or mass Genitalia: Not examined Neurologic exam: Consistent with cerebral palsy/generalized weakness  Laboratory Data:  No results found for this or any previous visit (from the past 24 hour(s)).  No results found for this or any previous visit (from the past 240 hour(s)). Creatinine:  Basename 10/01/11 0914  CREATININE 0.43*    Radiologic Imaging: No results found.  Impression/Assessment:  History of infected, large right renal pelvic and lower pole calculi, status post placement of percutaneous nephrostomy tube, patient has been on IV gentamicin  Plan:  Right-sided percutaneous nephrolithotomy. Risks and benefits/complications of the procedure have been discussed with the patient at length on recent evaluation in my office. She understands and desires to proceed.  Chelsea Aus 10/06/2011, 9:01 PM  Bertram Millard. Tysheena Ginzburg MD

## 2011-10-07 ENCOUNTER — Encounter (HOSPITAL_COMMUNITY): Payer: Self-pay | Admitting: *Deleted

## 2011-10-07 ENCOUNTER — Ambulatory Visit (HOSPITAL_COMMUNITY): Payer: Medicare Other | Admitting: Anesthesiology

## 2011-10-07 ENCOUNTER — Inpatient Hospital Stay (HOSPITAL_COMMUNITY)
Admission: RE | Admit: 2011-10-07 | Discharge: 2011-10-11 | DRG: 660 | Disposition: A | Discharge status: Skilled Nursing Facility | Payer: Medicare Other | Source: Ambulatory Visit | Attending: Urology | Admitting: Urology

## 2011-10-07 ENCOUNTER — Encounter (HOSPITAL_COMMUNITY)
Admission: RE | Disposition: A | Discharge status: Skilled Nursing Facility | Payer: Self-pay | Source: Ambulatory Visit | Attending: Urology

## 2011-10-07 ENCOUNTER — Encounter (HOSPITAL_COMMUNITY): Payer: Self-pay | Admitting: Anesthesiology

## 2011-10-07 ENCOUNTER — Ambulatory Visit (HOSPITAL_COMMUNITY): Payer: Medicare Other

## 2011-10-07 DIAGNOSIS — E039 Hypothyroidism, unspecified: Secondary | ICD-10-CM | POA: Diagnosis present

## 2011-10-07 DIAGNOSIS — F3289 Other specified depressive episodes: Secondary | ICD-10-CM | POA: Diagnosis present

## 2011-10-07 DIAGNOSIS — N200 Calculus of kidney: Principal | ICD-10-CM | POA: Diagnosis present

## 2011-10-07 DIAGNOSIS — I10 Essential (primary) hypertension: Secondary | ICD-10-CM | POA: Diagnosis present

## 2011-10-07 DIAGNOSIS — N390 Urinary tract infection, site not specified: Secondary | ICD-10-CM | POA: Diagnosis present

## 2011-10-07 DIAGNOSIS — J449 Chronic obstructive pulmonary disease, unspecified: Secondary | ICD-10-CM | POA: Diagnosis present

## 2011-10-07 DIAGNOSIS — J4489 Other specified chronic obstructive pulmonary disease: Secondary | ICD-10-CM | POA: Diagnosis present

## 2011-10-07 DIAGNOSIS — F329 Major depressive disorder, single episode, unspecified: Secondary | ICD-10-CM | POA: Diagnosis present

## 2011-10-07 DIAGNOSIS — K449 Diaphragmatic hernia without obstruction or gangrene: Secondary | ICD-10-CM | POA: Diagnosis present

## 2011-10-07 DIAGNOSIS — E119 Type 2 diabetes mellitus without complications: Secondary | ICD-10-CM | POA: Diagnosis present

## 2011-10-07 DIAGNOSIS — F411 Generalized anxiety disorder: Secondary | ICD-10-CM | POA: Diagnosis present

## 2011-10-07 HISTORY — PX: NEPHROLITHOTOMY: SHX5134

## 2011-10-07 HISTORY — DX: Unspecified psychosis not due to a substance or known physiological condition: F29

## 2011-10-07 HISTORY — DX: Muscle weakness (generalized): M62.81

## 2011-10-07 HISTORY — DX: Anxiety disorder, unspecified: F41.9

## 2011-10-07 HISTORY — DX: Full incontinence of feces: R15.9

## 2011-10-07 HISTORY — DX: Pressure ulcer of sacral region, unspecified stage: L89.159

## 2011-10-07 HISTORY — DX: Hypothyroidism, unspecified: E03.9

## 2011-10-07 HISTORY — DX: Unspecified urinary incontinence: R32

## 2011-10-07 HISTORY — DX: Mild intellectual disabilities: F70

## 2011-10-07 HISTORY — DX: Vitamin D deficiency, unspecified: E55.9

## 2011-10-07 HISTORY — DX: Hyperlipidemia, unspecified: E78.5

## 2011-10-07 HISTORY — DX: Neuromuscular dysfunction of bladder, unspecified: N31.9

## 2011-10-07 HISTORY — DX: Dysphasia: R47.02

## 2011-10-07 LAB — BASIC METABOLIC PANEL
BUN: 9 mg/dL (ref 6–23)
Calcium: 9 mg/dL (ref 8.4–10.5)
Creatinine, Ser: 0.47 mg/dL — ABNORMAL LOW (ref 0.50–1.10)
GFR calc Af Amer: >90 mL/min (ref >90)
GFR calc non Af Amer: >90 mL/min (ref >90)
Glucose, Bld: 105 mg/dL — ABNORMAL HIGH (ref 70–99)
Potassium: 3.8 mEq/L (ref 3.5–5.1)

## 2011-10-07 SURGERY — NEPHROLITHOTOMY PERCUTANEOUS
Anesthesia: General | Laterality: Right | Wound class: Clean Contaminated

## 2011-10-07 MED ORDER — LACTATED RINGERS IV SOLN: INTRAVENOUS | Status: DC

## 2011-10-07 MED ORDER — LEVOTHYROXINE SODIUM 25 MCG PO TABS
25.0000 ug | ORAL_TABLET | Freq: Every day | ORAL | Status: DC
  Administered 2011-10-08 – 2011-10-11 (×4): 25 ug via ORAL
  Filled 2011-10-07 (×5): qty 1

## 2011-10-07 MED ORDER — LIDOCAINE HCL (CARDIAC) 20 MG/ML IV SOLN
INTRAVENOUS | Status: DC | PRN
  Administered 2011-10-07: 100 mg via INTRAVENOUS

## 2011-10-07 MED ORDER — ALBUTEROL SULFATE HFA 108 (90 BASE) MCG/ACT IN AERS
2.0000 | INHALATION_SPRAY | RESPIRATORY_TRACT | Status: DC | PRN
  Filled 2011-10-07: qty 6.7

## 2011-10-07 MED ORDER — LORAZEPAM 1 MG PO TABS
1.0000 mg | ORAL_TABLET | ORAL | Status: DC | PRN
  Administered 2011-10-09: 1 mg via ORAL
  Filled 2011-10-07: qty 1

## 2011-10-07 MED ORDER — PROMETHAZINE HCL 25 MG/ML IJ SOLN: 6.2500 mg | INTRAMUSCULAR | Status: DC | PRN

## 2011-10-07 MED ORDER — RISPERIDONE 2 MG PO TABS
2.0000 mg | ORAL_TABLET | Freq: Two times a day (BID) | ORAL | Status: DC
  Administered 2011-10-07 – 2011-10-11 (×9): 2 mg via ORAL
  Filled 2011-10-07 (×12): qty 1

## 2011-10-07 MED ORDER — FENTANYL CITRATE 0.05 MG/ML IJ SOLN
INTRAMUSCULAR | Status: DC | PRN
  Administered 2011-10-07 (×2): 50 ug via INTRAVENOUS
  Administered 2011-10-07: 100 ug via INTRAVENOUS
  Administered 2011-10-07: 50 ug via INTRAVENOUS

## 2011-10-07 MED ORDER — IOHEXOL 300 MG/ML  SOLN
INTRAMUSCULAR | Status: DC | PRN
  Administered 2011-10-07: 20 mL

## 2011-10-07 MED ORDER — LAMOTRIGINE 100 MG PO TABS
100.0000 mg | ORAL_TABLET | Freq: Two times a day (BID) | ORAL | Status: DC
  Administered 2011-10-07 – 2011-10-11 (×9): 100 mg via ORAL
  Filled 2011-10-07 (×10): qty 1

## 2011-10-07 MED ORDER — LORAZEPAM 2 MG/ML IJ SOLN: 1.0000 mg | Freq: Four times a day (QID) | INTRAMUSCULAR | Status: DC | PRN

## 2011-10-07 MED ORDER — CITALOPRAM HYDROBROMIDE 40 MG PO TABS
40.0000 mg | ORAL_TABLET | Freq: Every day | ORAL | Status: DC
  Administered 2011-10-07 – 2011-10-10 (×4): 40 mg via ORAL
  Filled 2011-10-07 (×5): qty 1

## 2011-10-07 MED ORDER — NALOXONE HCL 0.4 MG/ML IJ SOLN
0.4000 mg | INTRAMUSCULAR | Status: DC | PRN
  Administered 2011-10-08: 0.4 mg via INTRAVENOUS
  Filled 2011-10-07: qty 1

## 2011-10-07 MED ORDER — IOHEXOL 300 MG/ML  SOLN
INTRAMUSCULAR | Status: AC
  Filled 2011-10-07: qty 2

## 2011-10-07 MED ORDER — MIDAZOLAM HCL 5 MG/5ML IJ SOLN
INTRAMUSCULAR | Status: DC | PRN
  Administered 2011-10-07: 2 mg via INTRAVENOUS

## 2011-10-07 MED ORDER — MONTELUKAST SODIUM 10 MG PO TABS
10.0000 mg | ORAL_TABLET | Freq: Every morning | ORAL | Status: DC
  Administered 2011-10-07 – 2011-10-11 (×5): 10 mg via ORAL
  Filled 2011-10-07 (×5): qty 1

## 2011-10-07 MED ORDER — HYDROMORPHONE 0.3 MG/ML IV SOLN
INTRAVENOUS | Status: DC
  Administered 2011-10-07: 0.6 mg via INTRAVENOUS
  Administered 2011-10-07: 10:00:00 via INTRAVENOUS
  Administered 2011-10-08: 0.999 mg via INTRAVENOUS
  Administered 2011-10-08: 0.4 mg via INTRAVENOUS

## 2011-10-07 MED ORDER — SALINE SPRAY 0.65 % NA SOLN
1.0000 | Freq: Two times a day (BID) | NASAL | Status: DC
  Administered 2011-10-07 – 2011-10-11 (×9): 1 via NASAL
  Filled 2011-10-07: qty 44

## 2011-10-07 MED ORDER — BUDESONIDE-FORMOTEROL FUMARATE 160-4.5 MCG/ACT IN AERO
1.0000 | INHALATION_SPRAY | Freq: Two times a day (BID) | RESPIRATORY_TRACT | Status: DC
  Administered 2011-10-07 – 2011-10-11 (×9): 1 via RESPIRATORY_TRACT
  Filled 2011-10-07: qty 6

## 2011-10-07 MED ORDER — PIPERACILLIN-TAZOBACTAM 3.375 G IVPB
3.3750 g | Freq: Three times a day (TID) | INTRAVENOUS | Status: DC
  Administered 2011-10-07 – 2011-10-11 (×11): 3.375 g via INTRAVENOUS
  Filled 2011-10-07 (×14): qty 50

## 2011-10-07 MED ORDER — GLYCOPYRROLATE 0.2 MG/ML IJ SOLN
INTRAMUSCULAR | Status: DC | PRN
  Administered 2011-10-07: .6 mg via INTRAVENOUS
  Administered 2011-10-07: .1 mg via INTRAVENOUS

## 2011-10-07 MED ORDER — MUPIROCIN 2 % EX OINT
TOPICAL_OINTMENT | Freq: Two times a day (BID) | CUTANEOUS | Status: DC
  Administered 2011-10-07: 1 via NASAL
  Administered 2011-10-07: 12:00:00 via NASAL
  Filled 2011-10-07: qty 22

## 2011-10-07 MED ORDER — FLUTICASONE PROPIONATE 50 MCG/ACT NA SUSP
2.0000 | Freq: Every morning | NASAL | Status: DC
  Administered 2011-10-07 – 2011-10-11 (×5): 2 via NASAL
  Filled 2011-10-07: qty 16

## 2011-10-07 MED ORDER — NEOSTIGMINE METHYLSULFATE 1 MG/ML IJ SOLN
INTRAMUSCULAR | Status: DC | PRN
  Administered 2011-10-07: 4 mg via INTRAVENOUS

## 2011-10-07 MED ORDER — LEVETIRACETAM 250 MG PO TABS
250.0000 mg | ORAL_TABLET | Freq: Two times a day (BID) | ORAL | Status: DC
  Administered 2011-10-07 – 2011-10-11 (×9): 250 mg via ORAL
  Filled 2011-10-07 (×10): qty 1

## 2011-10-07 MED ORDER — DIPHENHYDRAMINE HCL 50 MG/ML IJ SOLN: 12.5000 mg | Freq: Four times a day (QID) | INTRAMUSCULAR | Status: DC | PRN

## 2011-10-07 MED ORDER — NITROGLYCERIN 0.4 MG SL SUBL: 0.4000 mg | SUBLINGUAL_TABLET | SUBLINGUAL | Status: DC | PRN

## 2011-10-07 MED ORDER — PROPOFOL 10 MG/ML IV BOLUS
INTRAVENOUS | Status: DC | PRN
  Administered 2011-10-07: 180 mg via INTRAVENOUS

## 2011-10-07 MED ORDER — RISPERIDONE MICROSPHERES 25 MG IM SUSR
25.0000 mg | INTRAMUSCULAR | Status: DC
  Administered 2011-10-10: 25 mg via INTRAMUSCULAR
  Filled 2011-10-07 (×2): qty 2

## 2011-10-07 MED ORDER — DEXTROSE-NACL 5-0.45 % IV SOLN
INTRAVENOUS | Status: DC
  Administered 2011-10-07: 18:00:00 via INTRAVENOUS
  Administered 2011-10-08: 75 mL/h via INTRAVENOUS
  Administered 2011-10-09 – 2011-10-10 (×2): via INTRAVENOUS

## 2011-10-07 MED ORDER — ONDANSETRON HCL 4 MG/2ML IJ SOLN: 4.0000 mg | Freq: Four times a day (QID) | INTRAMUSCULAR | Status: DC | PRN

## 2011-10-07 MED ORDER — CEFAZOLIN SODIUM-DEXTROSE 2-3 GM-% IV SOLR
2.0000 g | INTRAVENOUS | Status: AC
  Administered 2011-10-07: 2 g via INTRAVENOUS

## 2011-10-07 MED ORDER — BACLOFEN 10 MG PO TABS
10.0000 mg | ORAL_TABLET | Freq: Four times a day (QID) | ORAL | Status: DC
  Administered 2011-10-07 – 2011-10-11 (×17): 10 mg via ORAL
  Filled 2011-10-07 (×19): qty 1

## 2011-10-07 MED ORDER — SODIUM CHLORIDE 0.9 % IJ SOLN: 9.0000 mL | INTRAMUSCULAR | Status: DC | PRN

## 2011-10-07 MED ORDER — ONDANSETRON HCL 4 MG/2ML IJ SOLN: 4.0000 mg | INTRAMUSCULAR | Status: DC | PRN

## 2011-10-07 MED ORDER — FENTANYL CITRATE 0.05 MG/ML IJ SOLN: 25.0000 ug | INTRAMUSCULAR | Status: DC | PRN

## 2011-10-07 MED ORDER — ROCURONIUM BROMIDE 100 MG/10ML IV SOLN
INTRAVENOUS | Status: DC | PRN
  Administered 2011-10-07: 20 mg via INTRAVENOUS
  Administered 2011-10-07: 50 mg via INTRAVENOUS

## 2011-10-07 MED ORDER — ACETAMINOPHEN 10 MG/ML IV SOLN
1000.0000 mg | Freq: Four times a day (QID) | INTRAVENOUS | Status: AC
  Administered 2011-10-07 – 2011-10-08 (×4): 1000 mg via INTRAVENOUS
  Filled 2011-10-07 (×4): qty 100

## 2011-10-07 MED ORDER — LACTULOSE 10 GM/15ML PO SOLN
20.0000 g | Freq: Every day | ORAL | Status: DC | PRN
  Filled 2011-10-07: qty 30

## 2011-10-07 MED ORDER — SODIUM CHLORIDE 0.9 % IR SOLN
Status: DC | PRN
  Administered 2011-10-07: 6000 mL

## 2011-10-07 MED ORDER — HYDROMORPHONE 0.3 MG/ML IV SOLN
INTRAVENOUS | Status: AC
  Filled 2011-10-07: qty 25

## 2011-10-07 MED ORDER — LACTATED RINGERS IV SOLN
INTRAVENOUS | Status: DC | PRN
  Administered 2011-10-07 (×2): via INTRAVENOUS

## 2011-10-07 MED ORDER — CEFAZOLIN SODIUM-DEXTROSE 2-3 GM-% IV SOLR
INTRAVENOUS | Status: AC
  Filled 2011-10-07: qty 50

## 2011-10-07 MED ORDER — SODIUM CHLORIDE 0.65 % NA SOLN: 1.0000 | Freq: Two times a day (BID) | NASAL | Status: DC

## 2011-10-07 MED ORDER — KETOTIFEN FUMARATE 0.025 % OP SOLN: 1.0000 [drp] | Freq: Two times a day (BID) | OPHTHALMIC | Status: DC

## 2011-10-07 MED ORDER — EPHEDRINE SULFATE 50 MG/ML IJ SOLN
INTRAMUSCULAR | Status: DC | PRN
  Administered 2011-10-07: 10 mg via INTRAVENOUS

## 2011-10-07 MED ORDER — SODIUM CHLORIDE 0.9 % IJ SOLN
10.0000 mL | INTRAMUSCULAR | Status: DC | PRN
  Administered 2011-10-07: 10 mL
  Administered 2011-10-07: 20 mL
  Administered 2011-10-08 – 2011-10-09 (×2): 10 mL

## 2011-10-07 MED ORDER — SIMVASTATIN 5 MG PO TABS
5.0000 mg | ORAL_TABLET | Freq: Every day | ORAL | Status: DC
  Administered 2011-10-07 – 2011-10-10 (×4): 5 mg via ORAL
  Filled 2011-10-07 (×5): qty 1

## 2011-10-07 MED ORDER — ONDANSETRON HCL 4 MG/2ML IJ SOLN
INTRAMUSCULAR | Status: DC | PRN
  Administered 2011-10-07: 4 mg via INTRAVENOUS

## 2011-10-07 MED ORDER — DIPHENHYDRAMINE HCL 12.5 MG/5ML PO ELIX: 12.5000 mg | ORAL_SOLUTION | Freq: Four times a day (QID) | ORAL | Status: DC | PRN

## 2011-10-07 MED ORDER — PANTOPRAZOLE SODIUM 40 MG PO TBEC
40.0000 mg | DELAYED_RELEASE_TABLET | Freq: Every day | ORAL | Status: DC
  Administered 2011-10-07 – 2011-10-11 (×5): 40 mg via ORAL
  Filled 2011-10-07 (×5): qty 1

## 2011-10-07 MED ORDER — CLONAZEPAM 1 MG PO TABS
1.0000 mg | ORAL_TABLET | Freq: Two times a day (BID) | ORAL | Status: DC
  Administered 2011-10-07 – 2011-10-11 (×9): 1 mg via ORAL
  Filled 2011-10-07 (×9): qty 1

## 2011-10-07 MED ORDER — LORATADINE 10 MG PO TABS
10.0000 mg | ORAL_TABLET | Freq: Every day | ORAL | Status: DC
  Administered 2011-10-07 – 2011-10-11 (×5): 10 mg via ORAL
  Filled 2011-10-07 (×5): qty 1

## 2011-10-07 SURGICAL SUPPLY — 46 items
APL SKNCLS STERI-STRIP NONHPOA (GAUZE/BANDAGES/DRESSINGS) ×2
BAG URINE DRAINAGE (UROLOGICAL SUPPLIES) ×2 IMPLANT
BASKET ZERO TIP NITINOL 2.4FR (BASKET) ×1 IMPLANT
BENZOIN TINCTURE PRP APPL 2/3 (GAUZE/BANDAGES/DRESSINGS) ×4 IMPLANT
BLADE SURG 15 STRL LF DISP TIS (BLADE) ×1 IMPLANT
BLADE SURG 15 STRL SS (BLADE) ×2
BSKT STON RTRVL ZERO TP 2.4FR (BASKET)
CATCHER STONE W/TUBE ADAPTER (UROLOGICAL SUPPLIES) ×1 IMPLANT
CATH FOLEY 2W COUNCIL 20FR 5CC (CATHETERS) IMPLANT
CATH FOLEY 2W COUNCIL 5CC 18FR (CATHETERS) ×1 IMPLANT
CATH ROBINSON RED A/P 20FR (CATHETERS) IMPLANT
CATH X-FORCE N30 NEPHROSTOMY (TUBING) ×2 IMPLANT
CLOTH BEACON ORANGE TIMEOUT ST (SAFETY) ×2 IMPLANT
COVER SURGICAL LIGHT HANDLE (MISCELLANEOUS) ×2 IMPLANT
DRAPE C-ARM 42X72 X-RAY (DRAPES) ×2 IMPLANT
DRAPE CAMERA CLOSED 9X96 (DRAPES) ×2 IMPLANT
DRAPE LINGEMAN PERC (DRAPES) ×2 IMPLANT
DRAPE SURG IRRIG POUCH 19X23 (DRAPES) ×2 IMPLANT
DRSG PAD ABDOMINAL 8X10 ST (GAUZE/BANDAGES/DRESSINGS) ×1 IMPLANT
DRSG TEGADERM 8X12 (GAUZE/BANDAGES/DRESSINGS) ×3 IMPLANT
GLOVE BIOGEL M 8.0 STRL (GLOVE) ×2 IMPLANT
GOWN STRL REIN XL XLG (GOWN DISPOSABLE) ×2 IMPLANT
KIT BASIN OR (CUSTOM PROCEDURE TRAY) ×2 IMPLANT
LASER FIBER DISP (UROLOGICAL SUPPLIES) IMPLANT
LASER FIBER DISP 1000U (UROLOGICAL SUPPLIES) IMPLANT
MANIFOLD NEPTUNE II (INSTRUMENTS) ×2 IMPLANT
NS IRRIG 1000ML POUR BTL (IV SOLUTION) ×2 IMPLANT
PACK BASIC VI WITH GOWN DISP (CUSTOM PROCEDURE TRAY) ×2 IMPLANT
PAD ABD 7.5X8 STRL (GAUZE/BANDAGES/DRESSINGS) ×4 IMPLANT
PROBE LITHOCLAST ULTRA 3.8X403 (UROLOGICAL SUPPLIES) IMPLANT
PROBE PNEUMATIC 1.0MMX570MM (UROLOGICAL SUPPLIES) ×1 IMPLANT
SET IRRIG Y TYPE TUR BLADDER L (SET/KITS/TRAYS/PACK) ×2 IMPLANT
SET WARMING FLUID IRRIGATION (MISCELLANEOUS) ×1 IMPLANT
SPONGE GAUZE 4X4 12PLY (GAUZE/BANDAGES/DRESSINGS) ×2 IMPLANT
SPONGE LAP 4X18 X RAY DECT (DISPOSABLE) ×2 IMPLANT
STENT CONTOUR 7FRX24X.038 (STENTS) ×1 IMPLANT
STONE CATCHER W/TUBE ADAPTER (UROLOGICAL SUPPLIES) IMPLANT
SUT SILK 2 0 30  PSL (SUTURE) ×1
SUT SILK 2 0 30 PSL (SUTURE) ×1 IMPLANT
SYR 20CC LL (SYRINGE) ×4 IMPLANT
SYRINGE 10CC LL (SYRINGE) ×2 IMPLANT
TRAP SPECIMEN MUCOUS 40CC (MISCELLANEOUS) ×1 IMPLANT
TRAY FOLEY BAG SILVER LF 14FR (CATHETERS) ×2 IMPLANT
TRAY FOLEY CATH 14FRSI W/METER (CATHETERS) ×2 IMPLANT
TUBING CONNECTING 10 (TUBING) ×6 IMPLANT
WATER STERILE IRR 1500ML POUR (IV SOLUTION) ×2 IMPLANT

## 2011-10-07 NOTE — Progress Notes (Signed)
Post-op note  Subjective: The patient is doing well.  No complaints.Urine pink w/o clots  Objective: Vital signs in last 24 hours: Temp:  [97.2 F (36.2 C)-98.8 F (37.1 C)] 98 F (36.7 C) (08/05 1300) Pulse Rate:  [71-95] 71  (08/05 1300) Resp:  [10-20] 14  (08/05 1300) BP: (114-156)/(63-79) 123/69 mmHg (08/05 1300) SpO2:  [92 %-100 %] 98 % (08/05 1300) Weight:  [93.7 kg (206 lb 9.1 oz)-94.802 kg (209 lb)] 93.7 kg (206 lb 9.1 oz) (08/05 1131)  Intake/Output from previous day:   Intake/Output this shift: Total I/O In: 1400 [I.V.:1400] Out: 350 [Urine:300; Blood:50]  Physical Exam:  General: Alert and oriented. Abdomen: Soft, Nondistended. Incisions: Clean and dry.  Lab Results:  Basename 10/07/11 1225  HGB 12.5  HCT 37.7    Assessment/Plan: POD#0   1) Continue to monitor   Bertram Millard. Scotti Kosta, MD   LOS: 0 days   Marcine Matar M 10/07/2011, 5:04 PM

## 2011-10-07 NOTE — Progress Notes (Signed)
Height , Weight and medications time give by Dorrene German at [facility].

## 2011-10-07 NOTE — Progress Notes (Signed)
Report given to Carol, RN at 2300 to assume care of patient. Pualani Borah M. Cleotha Whalin, RN  

## 2011-10-07 NOTE — Op Note (Signed)
Preoperative diagnosis: Right renal pelvic calculi  Postoperative diagnosis: Same  Principal procedure: Percutaneous nephrolithotomy  Surgeon: Holman Bonsignore  Anesthesia: Gen. endotracheal  Complications: None  Drains: 5 French Kumpe catheter in right ureter, 18 French council-tip catheter in right renal pelvis  Specimens: Stone fragments (sent to office)  Indications: 46 year old female with a complicated urologic history including neurogenic bladder, in the past involving suprapubic tube which is now removed. She has had a recent diagnosis of right renal calculi and a resistant bacterial infection. She has been on gentamicin administered via PICC line for the past week. She had a percutaneous nephrostomy tube placed in interventional radiology 7 days ago. She presents at this time, having had adequate preoperative antibiotics to cover her resistant Escherichia coli, for right percutaneous nephrolithotomy. I introduced myself to the patient in the office last week again (I saw her back approximately 7 years ago and parentheses, and discussed the procedure of nephrolithotomy with the patient. Risks and benefits were discussed. Risks include but are not limited to bleeding, need for transfusion, renal injury, need for second procedure, worsening infection/sepsis, DVT, PE, and others. She understands these and desires to proceed.  Description of procedure: The patient was preoperatively marked and received antibiotic in the holding area. She was also identified. He was taken the operating room where general anesthetic was administered. She was placed in the prone position following catheterization of the bladder. All extremities and pressure points were padded appropriately. Her right flank was prepped and draped. The Kumpe catheter was prepped into the field. Timeout was then performed.  I then advanced the guidewire using fluoroscopic guidance through the Kumpe catheter into the bladder. I removed the  Kumpe catheter, and placed the peel-away sheath. Following this, I placed a second guidewire fluoroscopically into the bladder. The peel-away sheath was removed. I then incised the skin for approximately 12 mm, and then placed a NephroMax balloon fluoroscopically into the renal pelvis. It was inflated to 16 atmospheres of pressure for approximately 1 minute . I then advanced the access sheath over top of the balloon, such that it was placed approximately in the lower pole calyx. The balloon was then deflated and removed. I then advanced the nephroscope through the sheath. Was evident that we were in the lower pole calyx and several small fragments were sucked out. I could not access the renal pelvis, as it seemed that there was some stenosis of the infundibulum. After several tries, I readmitted the balloon, inflated the balloon to 16 atmospheres of pressure, and advanced the access sheath into the renal pelvis. I know I was there because a large renal pelvic stone abutted the end of the sheath at this point. I then replaced the nephroscope, and the stone was identified. The pelvis was inspected. It was significantly friable, no doubt because of the long-term presence of this infected stone. The cyber wand was advanced through the scope, and the stone was easily disintegrated and sucked out. Small fragments were attached to the urothelium and were also removed. Following removal of this large stone, it was evident that there were some upper pole/infundibular stones, as well as a lower pole stone remaining. However, because of the friability of the renal pelvis, and my discomfort with being aggressive because of this, I felt that it would be most appropriate at this point to terminate the case, after small fragments had been picked out. No doubt, I felt that a second look procedure would be worthwhile in the future. The nephroscope was removed.  I advanced an 59 [place] council tip catheter over one of the wires, and a  nephrostogram was performed.  Nephrostogram revealed no flow of contrast and the ureter, and some extravasation in the renal pelvis and lower pole calyces. I negotiated the catheter to what I felt was a proper renal pelvis position, and placed 3 cc of the dilute contrast media in the balloon. I then sutured the catheter to the skin after the access sheath was removed. A 2-0 chromic was used for this. I then negotiated a Kumpe catheter, 5 [place], over top of the safety wire, down the ureter. Fluoroscopic guidance was used for this. It was sutured to the skin with the same 2-0 silk. Dry sterile dressings were placed. The catheter was hooked to dependent drainage. The Kumpe catheter was curled underneath the dressing.  The patient tolerated procedure well. She was awakened and taken to the PACU in stable condition.

## 2011-10-07 NOTE — Anesthesia Preprocedure Evaluation (Addendum)
Anesthesia Evaluation  Patient identified by MRN, date of birth, ID band Patient awake    Reviewed: Allergy & Precautions, H&P , NPO status , Patient's Chart, lab work & pertinent test results  Airway Mallampati: III TM Distance: >3 FB Neck ROM: Full    Dental  (+) Edentulous Upper and Edentulous Lower   Pulmonary pneumonia -, COPD breath sounds clear to auscultation  Pulmonary exam normal       Cardiovascular hypertension, Pt. on medications Rhythm:Regular Rate:Normal     Neuro/Psych  Headaches, Seizures -, Poorly Controlled,  Anxiety Depression  Neuromuscular disease negative neurological ROS  negative psych ROS   GI/Hepatic Neg liver ROS, hiatal hernia,   Endo/Other  Type 2, Oral Hypoglycemic AgentsHypothyroidism Morbid obesity  Renal/GU negative Renal ROS  negative genitourinary   Musculoskeletal negative musculoskeletal ROS (+)   Abdominal   Peds negative pediatric ROS (+)  Hematology negative hematology ROS (+)   Anesthesia Other Findings   Reproductive/Obstetrics negative OB ROS                          Anesthesia Physical Anesthesia Plan  ASA: III  Anesthesia Plan: General   Post-op Pain Management:    Induction: Intravenous  Airway Management Planned: Oral ETT  Additional Equipment:   Intra-op Plan:   Post-operative Plan: Extubation in OR  Informed Consent: I have reviewed the patients History and Physical, chart, labs and discussed the procedure including the risks, benefits and alternatives for the proposed anesthesia with the patient or authorized representative who has indicated his/her understanding and acceptance.   Dental advisory given  Plan Discussed with: CRNA  Anesthesia Plan Comments:         Anesthesia Quick Evaluation

## 2011-10-07 NOTE — Anesthesia Postprocedure Evaluation (Signed)
Anesthesia Post Note  Patient: Kristi Perry  Procedure(s) Performed: Procedure(s) (LRB): NEPHROLITHOTOMY PERCUTANEOUS (Right)  Anesthesia type: General  Patient location: PACU  Post pain: Pain level controlled  Post assessment: Post-op Vital signs reviewed  Last Vitals:  Filed Vitals:   10/07/11 1002  BP:   Pulse:   Temp:   Resp: 20    Post vital signs: Reviewed  Level of consciousness: sedated  Complications: No apparent anesthesia complications

## 2011-10-07 NOTE — Transfer of Care (Signed)
  Anesthesia Post-op Note  Patient: Kristi Perry  Procedure(s) Performed: Procedure(s) (LRB): NEPHROLITHOTOMY PERCUTANEOUS (Right)  Patient Location: PACU  Anesthesia Type: General  Level of Consciousness: awake and alert   Airway and Oxygen Therapy: Patient Spontanous Breathing  Post-op Pain: mild  Post-op Assessment: Post-op Vital signs reviewed, Patient's Cardiovascular Status Stable, Respiratory Function Stable, Patent Airway and No signs of Nausea or vomiting  Post-op Vital Signs: stable  Complications: No apparent anesthesia complications  

## 2011-10-08 ENCOUNTER — Inpatient Hospital Stay (HOSPITAL_COMMUNITY): Payer: Medicare Other

## 2011-10-08 ENCOUNTER — Other Ambulatory Visit: Payer: Self-pay | Admitting: Urology

## 2011-10-08 ENCOUNTER — Encounter (HOSPITAL_COMMUNITY): Payer: Self-pay | Admitting: Urology

## 2011-10-08 LAB — BASIC METABOLIC PANEL
BUN: 8 mg/dL (ref 6–23)
GFR calc Af Amer: >90 mL/min (ref >90)
GFR calc non Af Amer: >90 mL/min (ref >90)
Potassium: 3.5 mEq/L (ref 3.5–5.1)
Sodium: 134 mEq/L — ABNORMAL LOW (ref 135–145)

## 2011-10-08 LAB — HEMOGLOBIN AND HEMATOCRIT, BLOOD
HCT: 33.4 % — ABNORMAL LOW (ref 36.0–46.0)
Hemoglobin: 10.9 g/dL — ABNORMAL LOW (ref 12.0–15.0)

## 2011-10-08 MED ORDER — ACETAMINOPHEN 10 MG/ML IV SOLN
1000.0000 mg | Freq: Four times a day (QID) | INTRAVENOUS | Status: AC
  Administered 2011-10-08 – 2011-10-09 (×2): 1000 mg via INTRAVENOUS
  Filled 2011-10-08 (×4): qty 100

## 2011-10-08 MED ORDER — OXYCODONE-ACETAMINOPHEN 5-325 MG PO TABS
1.0000 | ORAL_TABLET | ORAL | Status: DC | PRN
  Administered 2011-10-08 – 2011-10-11 (×9): 2 via ORAL
  Administered 2011-10-11: 1 via ORAL
  Filled 2011-10-08 (×5): qty 2
  Filled 2011-10-08: qty 1
  Filled 2011-10-08 (×5): qty 2

## 2011-10-08 MED ORDER — PNEUMOCOCCAL VAC POLYVALENT 25 MCG/0.5ML IJ INJ
0.5000 mL | INJECTION | INTRAMUSCULAR | Status: AC
  Administered 2011-10-09: 0.5 mL via INTRAMUSCULAR
  Filled 2011-10-08: qty 0.5

## 2011-10-08 NOTE — Progress Notes (Signed)
1 Day Post-Op Subjective: Patient reports some pain, but it is managed by her PCA pump.  Objective: Vital signs in last 24 hours: Temp:  [97.2 F (36.2 C)-99.1 F (37.3 C)] 99.1 F (37.3 C) (08/06 0706) Pulse Rate:  [71-100] 97  (08/06 0706) Resp:  [8-20] 18  (08/06 0706) BP: (100-156)/(63-79) 100/64 mmHg (08/06 0706) SpO2:  [92 %-100 %] 94 % (08/06 0706) Weight:  [93.7 kg (206 lb 9.1 oz)] 93.7 kg (206 lb 9.1 oz) (08/05 1131)  Intake/Output from previous day: 08/05 0701 - 08/06 0700 In: 3619.5 [P.O.:660; I.V.:2759.5; IV Piggyback:200] Out: 1705 [Urine:1655; Blood:50] Intake/Output this shift: Total I/O In: -  Out: 850 [Urine:850]  Physical Exam:  Constitutional: Vital signs reviewed. WD WN in NAD   Abdomen: Soft, no significant tenderness. Flank dressing is dry.  Nephrostomy tube is draining bloody urine.  Lab Results:  Basename 10/08/11 0410 10/07/11 1225  HGB 10.9* 12.5  HCT 33.4* 37.7   BMET  Basename 10/08/11 0410 10/07/11 1225  NA 134* 138  K 3.5 3.8  CL 96 99  CO2 30 29  GLUCOSE 151* 105*  BUN 8 9  CREATININE 0.54 0.47*  CALCIUM 8.9 9.0   No results found for this basename: LABPT:3,INR:3 in the last 72 hours No results found for this basename: LABURIN:1 in the last 72 hours Results for orders placed during the hospital encounter of 10/07/11  SURGICAL PCR SCREEN     Status: Abnormal   Collection Time   10/07/11  5:16 AM      Component Value Range Status Comment   MRSA, PCR POSITIVE (*) NEGATIVE Final    Staphylococcus aureus POSITIVE (*) NEGATIVE Final     Studies/Results: Dg C-arm 1-60 Min-no Report  10/07/2011  CLINICAL DATA: kidney stone   C-ARM 1-60 MINUTES  Fluoroscopy was utilized by the requesting physician.  No radiographic  interpretation.      Assessment/Plan:   Postoperative day #1 right percutaneous nephrolithotomy. She is doing well.    I will order a CT stone protocol to followup her renal calculi. I did discuss with her the  probability of a "second look" procedure in a couple of days to clean up remaining stones.   LOS: 1 day   Chelsea Aus 10/08/2011, 7:37 AM

## 2011-10-08 NOTE — Progress Notes (Signed)
Clinical Social Work Department BRIEF PSYCHOSOCIAL ASSESSMENT 10/08/2011  Patient:  Kristi Perry, Kristi Perry     Account Number:  [id number]     Admit date:  10/07/2011  Clinical Social Worker:  Orpah Greek  Date/Time:  10/08/2011 11:18 AM  Referred by:  Physician  Date Referred:  10/08/2011 Referred for  Other - See comment   Other Referral:   Admitted from: [facility] SN[facility]   Interview type:  Family Other interview type:    PSYCHOSOCIAL DATA Living Status:  FACILITY Admitted from facility:  [facility] Level of care:  Skilled Nursing Facility Primary support name:  Calisa Luckenbaugh (mother) h#: [phone] Primary support relationship to patient:  PARENT Degree of support available:   good    CURRENT CONCERNS Current Concerns  Post-Acute Placement   Other Concerns:    SOCIAL WORK ASSESSMENT / PLAN CSW spoke with patient's mother, [name] re: discharge planning. Patient was admitted from [facility] and plans to return there at discharge.   Assessment/plan status:  Information/Referral to [facility] Other assessment/ plan:   Information/referral to community resources:   CSW completed FL2 and faxed information to [facility], confirmed with [facility] @ SNF that she is ok to return at discharge.    PATIENT'S/FAMILY'S RESPONSE TO PLAN OF CARE: Patient's mother is agreeable with her returning to SNF at discharge, was under the impression patient would not be in the hospital for very long.        Unice Bailey, LCSW [facility] Clinical Social Worker cell #: [phone]

## 2011-10-09 NOTE — H&P (Signed)
Chief Complaint: Obstructing rt nephrolithiasis Referring Physician:Dahlstedt HPI: Kristi Perry is an 46 y.o. female who underwent (R)nephroureteral cath placement on 7/30, she subsequently went to the OR on 8/4 for PCNL. She is doing ok but CT reveled remaining stone fragments but no residual ureteral dilatation.  We are requested to exchange Kumpe/nephroureteral catheter for internal JJ stent.  Past Medical History:  Past Medical History  Diagnosis Date  . Sepsis   . Hypertension   . DVT (deep vein thrombosis) in pregnancy   . Depression   . Chronic pain syndrome   . Allergic rhinitis   . Acute respiratory failure   . Epilepsy   . Migraine   . Chronic airway obstruction   . Cerebral palsy   . Dysphagia   . Hiatal hernia   . Morbid obesity 09-25-11    requires Hoyer lift. pt. doesn't stand or ambulate.  . Incontinent of urine 09-25-11    hx.  . Incontinent of feces 09-25-11    hx.  . Vitamin d deficiency   . Sepsis     hx of   . Hyperlipidemia   . Mild intellectual disabilities   . Neurogenic bladder   . Hypothyroidism   . Anxiety   . Unspecified psychosis   . Muscle weakness   . Dysphasia   . Pneumonia     hx of asp pneumonia 1/11-1/19/12  . Sacral decubitus ulcer     hx of    Past Surgical History:  Past Surgical History  Procedure Date  . Nephrolithotomy 10/07/2011    Procedure: NEPHROLITHOTOMY PERCUTANEOUS;  Surgeon: Marcine Matar, MD;  Location: [facility] ORS;  Service: Urology;  Laterality: Right;  kidney     Family History: History reviewed. No pertinent family history.  Social History:  reports that she has never smoked. She does not have any smokeless tobacco history on file. She reports that she does not drink alcohol or use illicit drugs.  Allergies:  Allergies  Allergen Reactions  . Sulfa Antibiotics Hives    Medications: Medications Prior to Admission  Medication Sig Dispense Refill  . acetaminophen (TYLENOL) 500 MG tablet Take 1,000 mg by mouth  2 (two) times daily.      Marland Kitchen albuterol (PROVENTIL HFA;VENTOLIN HFA) 108 (90 BASE) MCG/ACT inhaler Inhale 2 puffs into the lungs every 4 (four) hours as needed. For wheezing or shortness of breath      . aspirin 81 MG chewable tablet Chew 81 mg by mouth every morning.       . baclofen (LIORESAL) 10 MG tablet Take 10 mg by mouth 4 (four) times daily.       . budesonide-formoterol (SYMBICORT) 160-4.5 MCG/ACT inhaler Inhale 1 puff into the lungs 2 (two) times daily.      . Cholecalciferol (VITAMIN D) 400 UNITS capsule Take 400 Units by mouth every morning.       . citalopram (CELEXA) 40 MG tablet Take 40 mg by mouth at bedtime.      . clonazePAM (KLONOPIN) 1 MG tablet Take 1 mg by mouth 2 (two) times daily.       . fexofenadine-pseudoephedrine (ALLEGRA-D 24) 180-240 MG per 24 hr tablet Take 1 tablet by mouth every morning.      . fluticasone (FLONASE) 50 MCG/ACT nasal spray Place 2 sprays into the nose every morning.       Marland Kitchen HYDROcodone-acetaminophen (NORCO) 10-325 MG per tablet Take 1 tablet by mouth every 6 (six) hours as needed. For pain      .  ketotifen (ZADITOR) 0.025 % ophthalmic solution Place 1 drop into both eyes 2 (two) times daily.      Marland Kitchen lactulose (CHRONULAC) 10 GM/15ML solution Take 20 g by mouth at bedtime as needed. For constipation      . lamoTRIgine (LAMICTAL) 100 MG tablet Take 100 mg by mouth 2 (two) times daily.       Marland Kitchen levETIRAcetam (KEPPRA) 250 MG tablet Take 250 mg by mouth every 12 (twelve) hours.      Marland Kitchen levothyroxine (SYNTHROID, LEVOTHROID) 25 MCG tablet Take 25 mcg by mouth daily before breakfast.       . LORazepam (ATIVAN) 1 MG tablet Take 1 mg by mouth every 4 (four) hours as needed. For agitation      . montelukast (SINGULAIR) 10 MG tablet Take 10 mg by mouth every morning.       . Multiple Vitamins-Minerals (CERTA-VITE PO) Take 1 tablet by mouth every morning.       Marland Kitchen oxyCODONE (OXY IR/ROXICODONE) 5 MG immediate release tablet Take 5 mg by mouth 2 (two) times daily. Hold  for sedation      . pravastatin (PRAVACHOL) 40 MG tablet Take 40 mg by mouth every evening.       . risperiDONE (RISPERDAL) 2 MG tablet Take 2 mg by mouth 2 (two) times daily.      . risperiDONE microspheres (RISPERDAL CONSTA) 25 MG injection Inject 25 mg into the muscle every 14 (fourteen) days.      . vitamin C (ASCORBIC ACID) 500 MG tablet Take 500 mg by mouth 2 (two) times daily.      Marland Kitchen LORazepam (ATIVAN) 2 MG/ML injection Inject 1 mg into the muscle every 6 (six) hours as needed. For agitation,anxiety,witnessed seizures.      . nitroGLYCERIN (NITROSTAT) 0.4 MG SL tablet Place 0.4 mg under the tongue every 5 (five) minutes as needed. For chest pain      . omeprazole (PRILOSEC) 20 MG capsule Take 20 mg by mouth every morning.       . sodium chloride (OCEAN) 0.65 % nasal spray Place 1 spray into the nose 2 (two) times daily.        Please HPI for pertinent positives, otherwise complete 10 system ROS negative.  Physical Exam: Blood pressure 134/78, pulse 98, temperature 98 F (36.7 C), temperature source Oral, resp. rate 19, height 5\' 3"  (1.6 m), weight 206 lb 9.1 oz (93.7 kg), SpO2 98.00%. Body mass index is 36.59 kg/(m^2).   General Appearance:  Alert, cooperative, no distress, appears stated age, obese  Head:  Normocephalic, without obvious abnormality, atraumatic  ENT: Unremarkable  Neck: Supple, symmetrical, trachea midline, no adenopathy, thyroid: not enlarged, symmetric, no tenderness/mass/nodules  Lungs:   Clear to auscultation bilaterally, no w/r/r, respirations unlabored without use of accessory muscles.  Heart:  Regular rate and rhythm, S1, S2 normal, no murmur, rub or gallop. Carotids 2+ without bruit.  Abdomen:   Soft, non-tender, non distended. Bowel sounds active all four quadrants,  no masses, no organomegaly. Rt flank catheter intact, site clean   Results for orders placed during the hospital encounter of 10/07/11 (from the past 48 hour(s))  BASIC METABOLIC PANEL      Status: Abnormal   Collection Time   10/08/11  4:10 AM      Component Value Range Comment   Sodium 134 (*) 135 - 145 mEq/L    Potassium 3.5  3.5 - 5.1 mEq/L    Chloride 96  96 - 112 mEq/L  CO2 30  19 - 32 mEq/L    Glucose, Bld 151 (*) 70 - 99 mg/dL    BUN 8  6 - 23 mg/dL    Creatinine, Ser 1.61  0.50 - 1.10 mg/dL    Calcium 8.9  8.4 - 09.6 mg/dL    GFR calc non Af Amer >90  >90 mL/min    GFR calc Af Amer >90  >90 mL/min   HEMOGLOBIN AND HEMATOCRIT, BLOOD     Status: Abnormal   Collection Time   10/08/11  4:10 AM      Component Value Range Comment   Hemoglobin 10.9 (*) 12.0 - 15.0 g/dL    HCT 04.5 (*) 40.9 - 46.0 %    Ct Abdomen Pelvis Wo Contrast  10/08/2011  *RADIOLOGY REPORT*  Clinical Data: Follow-up percutaneous nephrolithotomy 10/07/2011. Right abdominal and flank pain with hematuria.  CT ABDOMEN AND PELVIS WITHOUT CONTRAST  Technique:  Multidetector CT imaging of the abdomen and pelvis was performed following the standard protocol without intravenous contrast.  Comparison: Abdominal pelvic CT 08/20/2011.  Findings: There are small bilateral pleural effusions and bibasilar atelectasis.  The small caliber percutaneous right nephroureteral catheter is well positioned, extending into the urinary bladder.  The large caliber nephrostomy tube is positioned along the inferior aspect of the right kidney with its tip outside the collecting system, likely in the perinephric space. There is a small amount of air and fluid within the inferior right perinephric space.  The previously demonstrated partial staghorn calculus has been fragmented.  There are residual fragments within the lower pole calyces and renal pelvis measuring up to 12 mm in diameter.  There is no significant ureteral dilatation.  There are multiple small stone fragments within the urinary bladder.  Foley catheter is in place.  There is no evidence of left renal or ureteral calculus.  There is no left-sided hydronephrosis.  There is  stable hepatic steatosis.  No focal abnormalities are identified.  As imaged in the noncontrast state, the spleen, gallbladder, pancreas and adrenal glands appear unremarkable. There is no evidence of bowel obstruction or retroperitoneal hemorrhage.  The uterus and adnexa appear normal.  Sigmoid colon diverticular changes are noted. Superficial pelvic varicosities are stable.  IMPRESSION:  1.  Interval partial fragmentation of right lower pole partial staghorn calculus.  There are multiple calculus fragments within the lower pole calyces, renal pelvis and urinary bladder. 2.  Counsel catheter is positioned in the inferior right perinephric space.  The nephroureteral catheter appears well positioned.  No significant residual ureteral dilatation. 3.  Small pleural effusions and mild bibasilar atelectasis.  Original Report Authenticated By: Gerrianne Scale, M.D.    Assessment/Plan Postoperative day #2 right percutaneous nephrolithotomy. She seems to be doing well. CT yesterday revealed quite a few remaining fragments in the right kidney, with the catheter outside the kidney. Plan for Image guided exchange of right Kumpe/nephroureteral to internal JJ stent tomorrow. Called pt mother Larene Beach on phone to obtain consent.   Brayton El PA-C 10/09/2011, 3:37 PM

## 2011-10-09 NOTE — Progress Notes (Signed)
2 Days Post-Op Subjective: Patient reports her pain is controlled.  Objective: Vital signs in last 24 hours: Temp:  [97.8 F (36.6 C)-99.1 F (37.3 C)] 97.9 F (36.6 C) (08/07 0444) Pulse Rate:  [94-100] 100  (08/07 0444) Resp:  [4-21] 18  (08/07 0444) BP: (100-142)/(64-77) 112/70 mmHg (08/07 0444) SpO2:  [94 %-99 %] 95 % (08/07 0444)  Intake/Output from previous day: 08/06 0701 - 08/07 0700 In: 955 [P.O.:200; I.V.:705; IV Piggyback:50] Out: 2525 [Urine:2525] Intake/Output this shift: Total I/O In: -  Out: 700 [Urine:700]  Physical Exam:  Constitutional: Vital signs reviewed. WD WN in NAD   Her dressing is dry  Lab Results:  Basename 10/08/11 0410 10/07/11 1225  HGB 10.9* 12.5  HCT 33.4* 37.7   BMET  Basename 10/08/11 0410 10/07/11 1225  NA 134* 138  K 3.5 3.8  CL 96 99  CO2 30 29  GLUCOSE 151* 105*  BUN 8 9  CREATININE 0.54 0.47*  CALCIUM 8.9 9.0   No results found for this basename: LABPT:3,INR:3 in the last 72 hours No results found for this basename: LABURIN:1 in the last 72 hours Results for orders placed during the hospital encounter of 10/07/11  SURGICAL PCR SCREEN     Status: Abnormal   Collection Time   10/07/11  5:16 AM      Component Value Range Status Comment   MRSA, PCR POSITIVE (*) NEGATIVE Final    Staphylococcus aureus POSITIVE (*) NEGATIVE Final     Studies/Results: Ct Abdomen Pelvis Wo Contrast  10/08/2011  *RADIOLOGY REPORT*  Clinical Data: Follow-up percutaneous nephrolithotomy 10/07/2011. Right abdominal and flank pain with hematuria.  CT ABDOMEN AND PELVIS WITHOUT CONTRAST  Technique:  Multidetector CT imaging of the abdomen and pelvis was performed following the standard protocol without intravenous contrast.  Comparison: Abdominal pelvic CT 08/20/2011.  Findings: There are small bilateral pleural effusions and bibasilar atelectasis.  The small caliber percutaneous right nephroureteral catheter is well positioned, extending into the  urinary bladder.  The large caliber nephrostomy tube is positioned along the inferior aspect of the right kidney with its tip outside the collecting system, likely in the perinephric space. There is a small amount of air and fluid within the inferior right perinephric space.  The previously demonstrated partial staghorn calculus has been fragmented.  There are residual fragments within the lower pole calyces and renal pelvis measuring up to 12 mm in diameter.  There is no significant ureteral dilatation.  There are multiple small stone fragments within the urinary bladder.  Foley catheter is in place.  There is no evidence of left renal or ureteral calculus.  There is no left-sided hydronephrosis.  There is stable hepatic steatosis.  No focal abnormalities are identified.  As imaged in the noncontrast state, the spleen, gallbladder, pancreas and adrenal glands appear unremarkable. There is no evidence of bowel obstruction or retroperitoneal hemorrhage.  The uterus and adnexa appear normal.  Sigmoid colon diverticular changes are noted. Superficial pelvic varicosities are stable.  IMPRESSION:  1.  Interval partial fragmentation of right lower pole partial staghorn calculus.  There are multiple calculus fragments within the lower pole calyces, renal pelvis and urinary bladder. 2.  Counsel catheter is positioned in the inferior right perinephric space.  The nephroureteral catheter appears well positioned.  No significant residual ureteral dilatation. 3.  Small pleural effusions and mild bibasilar atelectasis.  Original Report Authenticated By: Gerrianne Scale, M.D.   Dg C-arm 1-60 Min-no Report  10/07/2011  CLINICAL DATA: kidney  stone   C-ARM 1-60 MINUTES  Fluoroscopy was utilized by the requesting physician.  No radiographic  interpretation.      Assessment/Plan:   Postoperative day #2 right percutaneous nephrolithotomy. She seems to be doing well. CT yesterday revealed quite a few remaining fragments in the  right kidney, with the catheter outside the kidney.  At this point, because of her friable kidney, I think a second look percutaneous nephrolithotomy would be difficult and unsuccessful in totally removing the bulk of her stone that remains. I think it worthwhile to consider ureteroscopic approach for the remaining stones.  I spoke with the patient about internalizing the stent. We will do that in the next day or 2, hopefully, by interventional radiology. She can have herflank cathetersremoved after that.   LOS: 2 days   Marcine Matar M 10/09/2011, 5:53 AM

## 2011-10-10 ENCOUNTER — Inpatient Hospital Stay (HOSPITAL_COMMUNITY): Payer: Medicare Other

## 2011-10-10 ENCOUNTER — Encounter (HOSPITAL_COMMUNITY)
Admission: RE | Disposition: A | Discharge status: Skilled Nursing Facility | Payer: Self-pay | Source: Ambulatory Visit | Attending: Urology

## 2011-10-10 SURGERY — NEPHROLITHOTOMY PERCUTANEOUS SECOND LOOK
Anesthesia: General | Laterality: Right

## 2011-10-10 MED ORDER — MIDAZOLAM HCL 5 MG/5ML IJ SOLN
INTRAMUSCULAR | Status: AC | PRN
  Administered 2011-10-10: 2 mg via INTRAVENOUS

## 2011-10-10 MED ORDER — FENTANYL CITRATE 0.05 MG/ML IJ SOLN
INTRAMUSCULAR | Status: AC | PRN
  Administered 2011-10-10 (×2): 100 ug via INTRAVENOUS

## 2011-10-10 NOTE — Procedures (Signed)
Technically sccessful exchange of existing right sided 5 Fr catheter for double-J ureteral stent.   Percutaneous access maintained with placement of a 12 Fr PCN which was capped.  No immediate complications.

## 2011-10-10 NOTE — Progress Notes (Signed)
3 Days Post-Op Subjective: Patient reports no specific problems. Wants tubes out.  Objective: Vital signs in last 24 hours: Temp:  [98 F (36.7 C)-98.2 F (36.8 C)] 98.2 F (36.8 C) (08/08 0501) Pulse Rate:  [97-109] 97  (08/08 0501) Resp:  [19-20] 20  (08/08 0501) BP: (100-134)/(65-78) 123/75 mmHg (08/08 0501) SpO2:  [95 %-98 %] 95 % (08/08 0501)  Intake/Output from previous day: 08/07 0701 - 08/08 0700 In: 1897.5 [P.O.:960; I.V.:887.5; IV Piggyback:50] Out: 3000 [Urine:3000] Intake/Output this shift: Total I/O In: 1008.8 [I.V.:1008.8] Out: -   Physical Exam:  Constitutional: Vital signs reviewed. WD WN in NAD     Lab Results:  Basename 10/08/11 0410 10/07/11 1225  HGB 10.9* 12.5  HCT 33.4* 37.7   BMET  Basename 10/08/11 0410 10/07/11 1225  NA 134* 138  K 3.5 3.8  CL 96 99  CO2 30 29  GLUCOSE 151* 105*  BUN 8 9  CREATININE 0.54 0.47*  CALCIUM 8.9 9.0   No results found for this basename: LABPT:3,INR:3 in the last 72 hours No results found for this basename: LABURIN:1 in the last 72 hours Results for orders placed during the hospital encounter of 10/07/11  SURGICAL PCR SCREEN     Status: Abnormal   Collection Time   10/07/11  5:16 AM      Component Value Range Status Comment   MRSA, PCR POSITIVE (*) NEGATIVE Final    Staphylococcus aureus POSITIVE (*) NEGATIVE Final     Studies/Results: Ct Abdomen Pelvis Wo Contrast  10/08/2011  *RADIOLOGY REPORT*  Clinical Data: Follow-up percutaneous nephrolithotomy 10/07/2011. Right abdominal and flank pain with hematuria.  CT ABDOMEN AND PELVIS WITHOUT CONTRAST  Technique:  Multidetector CT imaging of the abdomen and pelvis was performed following the standard protocol without intravenous contrast.  Comparison: Abdominal pelvic CT 08/20/2011.  Findings: There are small bilateral pleural effusions and bibasilar atelectasis.  The small caliber percutaneous right nephroureteral catheter is well positioned, extending into the  urinary bladder.  The large caliber nephrostomy tube is positioned along the inferior aspect of the right kidney with its tip outside the collecting system, likely in the perinephric space. There is a small amount of air and fluid within the inferior right perinephric space.  The previously demonstrated partial staghorn calculus has been fragmented.  There are residual fragments within the lower pole calyces and renal pelvis measuring up to 12 mm in diameter.  There is no significant ureteral dilatation.  There are multiple small stone fragments within the urinary bladder.  Foley catheter is in place.  There is no evidence of left renal or ureteral calculus.  There is no left-sided hydronephrosis.  There is stable hepatic steatosis.  No focal abnormalities are identified.  As imaged in the noncontrast state, the spleen, gallbladder, pancreas and adrenal glands appear unremarkable. There is no evidence of bowel obstruction or retroperitoneal hemorrhage.  The uterus and adnexa appear normal.  Sigmoid colon diverticular changes are noted. Superficial pelvic varicosities are stable.  IMPRESSION:  1.  Interval partial fragmentation of right lower pole partial staghorn calculus.  There are multiple calculus fragments within the lower pole calyces, renal pelvis and urinary bladder. 2.  Counsel catheter is positioned in the inferior right perinephric space.  The nephroureteral catheter appears well positioned.  No significant residual ureteral dilatation. 3.  Small pleural effusions and mild bibasilar atelectasis.  Original Report Authenticated By: Gerrianne Scale, M.D.    Assessment/Plan:   Postoperative day 3 right percutaneous nephrolithotomy. I feel  it best to treat the rest of her stones the ureteroscopic approach, as her kidney is quite friable. Either a double-J stent for a nephroureteral catheter will be placed today. Possible discharge tomorrow. I will culture her urine. She will need a Foley catheter.    LOS: 3 days   Marcine Matar M 10/10/2011, 9:18 AM

## 2011-10-11 NOTE — Discharge Summary (Signed)
Patient ID: Kristi Perry MRN: [id number] DOB/AGE: 07/11/1965 46 y.o.  Admit date: 10/07/2011 Discharge date: 10/11/2011  Primary Care Physician:  No primary provider on file.  Discharge Diagnoses:   Right renal calculi UTI, resolved  Consults: radiology   Discharge Medications: Medication List  As of 10/11/2011 11:48 AM   STOP taking these medications         LORazepam 2 MG/ML injection         TAKE these medications         acetaminophen 500 MG tablet   Commonly known as: TYLENOL   Take 1,000 mg by mouth 2 (two) times daily.      albuterol 108 (90 BASE) MCG/ACT inhaler   Commonly known as: PROVENTIL HFA;VENTOLIN HFA   Inhale 2 puffs into the lungs every 4 (four) hours as needed. For wheezing or shortness of breath      aspirin 81 MG chewable tablet   Chew 81 mg by mouth every morning.      baclofen 10 MG tablet   Commonly known as: LIORESAL   Take 10 mg by mouth 4 (four) times daily.      budesonide-formoterol 160-4.5 MCG/ACT inhaler   Commonly known as: SYMBICORT   Inhale 1 puff into the lungs 2 (two) times daily.      CERTA-VITE PO   Take 1 tablet by mouth every morning.      citalopram 40 MG tablet   Commonly known as: CELEXA   Take 40 mg by mouth at bedtime.      clonazePAM 1 MG tablet   Commonly known as: KLONOPIN   Take 1 mg by mouth 2 (two) times daily.      fexofenadine-pseudoephedrine 180-240 MG per 24 hr tablet   Commonly known as: ALLEGRA-D 24   Take 1 tablet by mouth every morning.      fluticasone 50 MCG/ACT nasal spray   Commonly known as: FLONASE   Place 2 sprays into the nose every morning.      HYDROcodone-acetaminophen 10-325 MG per tablet   Commonly known as: NORCO   Take 1 tablet by mouth every 6 (six) hours as needed. For pain      ketotifen 0.025 % ophthalmic solution   Commonly known as: ZADITOR   Place 1 drop into both eyes 2 (two) times daily.      lactulose 10 GM/15ML solution   Commonly known as: CHRONULAC   Take 20 g by  mouth at bedtime as needed. For constipation      lamoTRIgine 100 MG tablet   Commonly known as: LAMICTAL   Take 100 mg by mouth 2 (two) times daily.      levETIRAcetam 250 MG tablet   Commonly known as: KEPPRA   Take 250 mg by mouth every 12 (twelve) hours.      levothyroxine 25 MCG tablet   Commonly known as: SYNTHROID, LEVOTHROID   Take 25 mcg by mouth daily before breakfast.      LORazepam 1 MG tablet   Commonly known as: ATIVAN   Take 1 mg by mouth every 4 (four) hours as needed. For agitation      montelukast 10 MG tablet   Commonly known as: SINGULAIR   Take 10 mg by mouth every morning.      nitroGLYCERIN 0.4 MG SL tablet   Commonly known as: NITROSTAT   Place 0.4 mg under the tongue every 5 (five) minutes as needed. For chest pain  omeprazole 20 MG capsule   Commonly known as: PRILOSEC   Take 20 mg by mouth every morning.      oxyCODONE 5 MG immediate release tablet   Commonly known as: Oxy IR/ROXICODONE   Take 5 mg by mouth 2 (two) times daily. Hold for sedation      pravastatin 40 MG tablet   Commonly known as: PRAVACHOL   Take 40 mg by mouth every evening.      risperiDONE 2 MG tablet   Commonly known as: RISPERDAL   Take 2 mg by mouth 2 (two) times daily.      risperiDONE microspheres 25 MG injection   Commonly known as: RISPERDAL CONSTA   Inject 25 mg into the muscle every 14 (fourteen) days.      sodium chloride 0.65 % nasal spray   Commonly known as: OCEAN   Place 1 spray into the nose 2 (two) times daily.      vitamin C 500 MG tablet   Commonly known as: ASCORBIC ACID   Take 500 mg by mouth 2 (two) times daily.      Vitamin D 400 UNITS capsule   Take 400 Units by mouth every morning.             Significant Diagnostic Studies:  Ir Fluoro Guide Cv Line Right  10/01/2011  *RADIOLOGY REPORT*  Clinical Data: Urinary tract infection related to a right staghorn renal calculus.  The patient requires a PICC line for IV antibiotic therapy  prior to a planned percutaneous nephrolithotomy procedure.  PICC LINE PLACEMENT WITH ULTRASOUND AND FLUOROSCOPIC  GUIDANCE  Fluoroscopy Time: 0.1 minutes.  The right arm was prepped with chlorhexidine, draped in the usual sterile fashion using maximum barrier technique (cap and mask, sterile gown, sterile gloves, large sterile sheet, hand hygiene and cutaneous antisepsis) and infiltrated locally with 1% Lidocaine.  Ultrasound demonstrated patency of the right basilic vein, and this was documented with an image.  Under real-time ultrasound guidance, this vein was accessed with a 21 gauge micropuncture needle and image documentation was performed.  The needle was exchanged over a guidewire for a peel-away sheath through which a 5 [place] dual lumen PICC trimmed to 40 cm was advanced, positioned with its tip at the lower SVC/right atrial junction.  Fluoroscopy during the procedure and fluoro spot radiograph confirms appropriate catheter position.  The catheter was flushed, secured to the skin with Prolene sutures, and covered with a sterile dressing.  Complications:  None  IMPRESSION: Successful right arm PICC line placement with ultrasound and fluoroscopic guidance.  The catheter is ready for use.  Original Report Authenticated By: Reola Calkins, M.D.   Ir US Guide Vasc Access Right  10/01/2011  *RADIOLOGY REPORT*  Clinical Data: Urinary tract infection related to a right staghorn renal calculus.  The patient requires a PICC line for IV antibiotic therapy prior to a planned percutaneous nephrolithotomy procedure.  PICC LINE PLACEMENT WITH ULTRASOUND AND FLUOROSCOPIC  GUIDANCE  Fluoroscopy Time: 0.1 minutes.  The right arm was prepped with chlorhexidine, draped in the usual sterile fashion using maximum barrier technique (cap and mask, sterile gown, sterile gloves, large sterile sheet, hand hygiene and cutaneous antisepsis) and infiltrated locally with 1% Lidocaine.  Ultrasound demonstrated patency of the right  basilic vein, and this was documented with an image.  Under real-time ultrasound guidance, this vein was accessed with a 21 gauge micropuncture needle and image documentation was performed.  The needle was exchanged over a guidewire for a  peel-away sheath through which a 5 [place] dual lumen PICC trimmed to 40 cm was advanced, positioned with its tip at the lower SVC/right atrial junction.  Fluoroscopy during the procedure and fluoro spot radiograph confirms appropriate catheter position.  The catheter was flushed, secured to the skin with Prolene sutures, and covered with a sterile dressing.  Complications:  None  IMPRESSION: Successful right arm PICC line placement with ultrasound and fluoroscopic guidance.  The catheter is ready for use.  Original Report Authenticated By: Reola Calkins, M.D.   Ir US Guide Bx Asp/drain  10/01/2011  *RADIOLOGY REPORT*  Clinical Data: Right staghorn renal calculus.  The patient requires percutaneous access and insertion of a ureteral catheter prior to planned percutaneous nephrolithotomy.  1.  ULTRASOUND GUIDANCE FOR PUNCTURE OF THE RIGHT RENAL COLLECTING SYSTEM. 2.  RIGHT PERCUTANEOUS URETERAL CATHETER PLACEMENT  Comparison:  CT dated 08/20/2011  Sedation: 3.0 mg IV Versed; 200 mcg IV Fentanyl.  Total Moderate Sedation Time: .  Contrast:  20 ml Omnipaque-300 ml Omnipaque 300  Additional Medications:  2.25 grams IV Zosyn.  Antibiotic was administered in an appropriate time frame prior to skin puncture.  Fluoroscopy Time: 11.1 minutes.  Procedure:  The procedure, risks, benefits, and alternatives were explained to the patient.  Questions regarding the procedure were encouraged and answered.  The patient understands and consents to the procedure.  The right flank region was prepped with Betadine in a sterile fashion, and a sterile drape was applied covering the operative field.  A sterile gown and sterile gloves were used for the procedure. Local anesthesia was provided  with 1% Lidocaine.  Ultrasound was used to localize the right kidney.  Under direct ultrasound guidance, a 21 gauge needle was advanced into the renal collecting system.  Ultrasound image documentation was performed. Aspiration of urine sample was performed followed by contrast injection. Under fluoroscopy, additional access of the right renal collecting system was performed with a 21 gauge needle with puncture of a lower pole calix.  A guidewire was advanced into the collecting system.  Eventually, a 5-French catheter was advanced over a hydrophilic wire to the level of the bladder.  Catheter positioning was confirmed by fluoroscopy.  The catheter was flushed and capped.  It was secured at the skin with two separate 0-silk retention sutures.  Complications:  None  Findings:  Initial fluoroscopy demonstrates a triangular central calculus in the renal pelvis and multiple calculi extending into the lower pole collecting system.  Initial ultrasound demonstrated relative dilatation of the upper pole collecting system compared to the mid to lower kidney.  Access of the upper pole was therefore performed initially under ultrasound guidance to opacify the collecting system.  After lower pole calyceal access, there was difficulty advancing a 5-French catheter through the renal pelvis and into the ureter due to the large central calculus.  Eventually, a hydrophilic catheter was able to be advanced over a hydrophilic wire, allowing catheter advancement to the level of the bladder.  This access will be secured for planned percutaneous nephrolithotomy.  IMPRESSION: Insertion of right percutaneous ureteral catheter via lower pole access as above.  A 5-French catheter was advanced into the bladder and was secured for access for planned nephrolithotomy procedure.  Original Report Authenticated By: Reola Calkins, M.D.   Ir [place] Abts Cath Perc Right  10/01/2011  *RADIOLOGY REPORT*  Clinical Data: Right staghorn renal  calculus.  The patient requires percutaneous access and insertion of a ureteral catheter prior to planned percutaneous nephrolithotomy.  1.  ULTRASOUND GUIDANCE FOR PUNCTURE OF THE RIGHT RENAL COLLECTING SYSTEM. 2.  RIGHT PERCUTANEOUS URETERAL CATHETER PLACEMENT  Comparison:  CT dated 08/20/2011  Sedation: 3.0 mg IV Versed; 200 mcg IV Fentanyl.  Total Moderate Sedation Time: .  Contrast:  20 ml Omnipaque-300 ml Omnipaque 300  Additional Medications:  2.25 grams IV Zosyn.  Antibiotic was administered in an appropriate time frame prior to skin puncture.  Fluoroscopy Time: 11.1 minutes.  Procedure:  The procedure, risks, benefits, and alternatives were explained to the patient.  Questions regarding the procedure were encouraged and answered.  The patient understands and consents to the procedure.  The right flank region was prepped with Betadine in a sterile fashion, and a sterile drape was applied covering the operative field.  A sterile gown and sterile gloves were used for the procedure. Local anesthesia was provided with 1% Lidocaine.  Ultrasound was used to localize the right kidney.  Under direct ultrasound guidance, a 21 gauge needle was advanced into the renal collecting system.  Ultrasound image documentation was performed. Aspiration of urine sample was performed followed by contrast injection. Under fluoroscopy, additional access of the right renal collecting system was performed with a 21 gauge needle with puncture of a lower pole calix.  A guidewire was advanced into the collecting system.  Eventually, a 5-French catheter was advanced over a hydrophilic wire to the level of the bladder.  Catheter positioning was confirmed by fluoroscopy.  The catheter was flushed and capped.  It was secured at the skin with two separate 0-silk retention sutures.  Complications:  None  Findings:  Initial fluoroscopy demonstrates a triangular central calculus in the renal pelvis and multiple calculi extending into  the lower pole collecting system.  Initial ultrasound demonstrated relative dilatation of the upper pole collecting system compared to the mid to lower kidney.  Access of the upper pole was therefore performed initially under ultrasound guidance to opacify the collecting system.  After lower pole calyceal access, there was difficulty advancing a 5-French catheter through the renal pelvis and into the ureter due to the large central calculus.  Eventually, a hydrophilic catheter was able to be advanced over a hydrophilic wire, allowing catheter advancement to the level of the bladder.  This access will be secured for planned percutaneous nephrolithotomy.  IMPRESSION: Insertion of right percutaneous ureteral catheter via lower pole access as above.  A 5-French catheter was advanced into the bladder and was secured for access for planned nephrolithotomy procedure.  Original Report Authenticated By: Reola Calkins, M.D.    Brief H and P: For complete details please refer to admission H and P, but in brief the patient was admitted for surgical management of large right renal calculi. She has had a treated UTI.  Hospital Course:  The patient was admitted for right percutaneous nephrolithotomy which was performed without difficulty, although incomplete removal of stones with her end result, due to her friable kidney. She ended up having her percutaneous drain removed and replaced with a nephroureteral catheter. We will attempt to remove the remaining stones by ureteroscopic approach.  Day of Discharge BP 106/71  Pulse 101  Temp 99.2 F (37.3 C) (Oral)  Resp 19  Ht 5\' 3"  (1.6 m)  Wt 93.7 kg (206 lb 9.1 oz)  BMI 36.59 kg/m2  SpO2 94%  No results found for this or any previous visit (from the past 24 hour(s)).  Physical Exam: General: Alert and awake oriented x3 not in any acute distress. HEENT: anicteric  sclera, pupils reactive to light and accommodation CVS: S1-S2 clear no murmur rubs or  gallops Chest: clear to auscultation bilaterally, no wheezing rales or rhonchi Abdomen: soft nontender, nondistended, normal bowel sounds, no organomegaly Extremities: no cyanosis, clubbing or edema noted bilaterally Neuro: Cranial nerves II-XII intact, no focal neurological deficits  Disposition: back to nursing facility  Diet: no restrictions  Activity: no restrictions   Disposition and Follow-up: Discharge Orders    Future Orders Please Complete By Expires   Change dressing      Scheduling Instructions:   Change dressing over nephrostomy tube daily   Nursing communication      Comments:   D/c picc line prior to d/c   Nursing communication      Comments:   Leave foley catheter in        TESTS THAT NEED FOLLOW-UP KUB  DISCHARGE FOLLOW-UP   Time spent on Discharge: 20 minutes  Signed: Chelsea Aus 10/11/2011, 11:48 AM

## 2011-10-11 NOTE — Progress Notes (Signed)
Pt to be d/c today to [facility].   Pt and family agreeable. Confirmed plans with facility.  Plan transfer via EMS.   Laurice Kimmons, LCSWA [place] [facility] [employer] [phone]   

## 2011-10-13 LAB — URINE CULTURE

## 2011-10-24 ENCOUNTER — Encounter (HOSPITAL_COMMUNITY): Payer: Self-pay | Admitting: Pharmacy Technician

## 2011-10-24 ENCOUNTER — Other Ambulatory Visit: Payer: Self-pay | Admitting: Urology

## 2011-10-28 ENCOUNTER — Other Ambulatory Visit: Payer: Self-pay | Admitting: Urology

## 2011-10-28 DIAGNOSIS — N159 Renal tubulo-interstitial disease, unspecified: Secondary | ICD-10-CM

## 2011-10-29 ENCOUNTER — Other Ambulatory Visit: Payer: Self-pay | Admitting: Urology

## 2011-10-29 ENCOUNTER — Ambulatory Visit (HOSPITAL_COMMUNITY)
Admission: RE | Admit: 2011-10-29 | Discharge: 2011-10-29 | Disposition: A | Discharge status: Home / Self Care | Payer: Medicare Other | Source: Ambulatory Visit | Attending: Urology | Admitting: Urology

## 2011-10-29 DIAGNOSIS — Z86718 Personal history of other venous thrombosis and embolism: Secondary | ICD-10-CM | POA: Insufficient documentation

## 2011-10-29 DIAGNOSIS — N390 Urinary tract infection, site not specified: Secondary | ICD-10-CM | POA: Insufficient documentation

## 2011-10-29 DIAGNOSIS — Z79899 Other long term (current) drug therapy: Secondary | ICD-10-CM | POA: Insufficient documentation

## 2011-10-29 DIAGNOSIS — N159 Renal tubulo-interstitial disease, unspecified: Secondary | ICD-10-CM

## 2011-10-29 DIAGNOSIS — G809 Cerebral palsy, unspecified: Secondary | ICD-10-CM | POA: Insufficient documentation

## 2011-10-29 DIAGNOSIS — E785 Hyperlipidemia, unspecified: Secondary | ICD-10-CM | POA: Insufficient documentation

## 2011-10-29 DIAGNOSIS — I10 Essential (primary) hypertension: Secondary | ICD-10-CM | POA: Insufficient documentation

## 2011-10-29 MED ORDER — LIDOCAINE HCL 1 % IJ SOLN
INTRAMUSCULAR | Status: AC
  Filled 2011-10-29: qty 20

## 2011-10-29 NOTE — Procedures (Signed)
Successful Rt UE SL PICC placement to basilic vein. Length 40cm with tip at Lower SVC/RA Ready for use. No complications.  Brayton El PA-C 10/29/2011 2:30 PM

## 2011-10-31 NOTE — Progress Notes (Signed)
10-31-11 1155 -phone consent obtained from mother,[name] W. Squillace, POA- and witnessed by 2 RN's. Mother desires Dr. Retta Diones to call her after surgery ended and give update on patient-note on chart for this, with phone number.W. Pharell Rolfson,RN.

## 2011-10-31 NOTE — Progress Notes (Signed)
10-31-11 1145 AM -faxed info for preop instructions for Kristi Perry faxed to [phone], and spoke with "Melissa" at [facility] info had been received and she would make sure RN got info. Told her to have RN call [phone] if any questions/concerns.W. Kennon Portela

## 2011-11-04 NOTE — H&P (Signed)
  H&P  Chief Complaint: Kidney stones  History of Present Illness: Kristi Perry is a 46 y.o. year old female presenting for further management of right renal calculi. She originally presented earlier this summer with a large right renal stone burden as well as a UTI. She was treated 1st with IV antibiotics, then underwent right PCNL on October 07, 2011. A large stone burden was removed, but the procedure was limited due to friability of her kidney. She was found on recent visit to have a UTI w/ enterococcus, and has been on IV vancomycin via a PICC line. She presents now for ureteroscopy to try to complete removing her stone burden.  Past Medical History  Diagnosis Date  . Sepsis   . Hypertension   . DVT (deep vein thrombosis) in pregnancy   . Depression   . Chronic pain syndrome   . Allergic rhinitis   . Acute respiratory failure   . Epilepsy   . Migraine   . Chronic airway obstruction   . Cerebral palsy   . Dysphagia   . Hiatal hernia   . Morbid obesity 09-25-11    requires Hoyer lift. pt. doesn't stand or ambulate.  . Incontinent of urine 09-25-11    hx.  . Incontinent of feces 09-25-11    hx.  . Vitamin d deficiency   . Sepsis     hx of   . Hyperlipidemia   . Mild intellectual disabilities   . Neurogenic bladder   . Hypothyroidism   . Anxiety   . Unspecified psychosis   . Muscle weakness   . Dysphasia   . Pneumonia     hx of asp pneumonia 1/11-1/19/12  . Sacral decubitus ulcer     hx of    Past Surgical History  Procedure Date  . Nephrolithotomy 10/07/2011    Procedure: NEPHROLITHOTOMY PERCUTANEOUS;  Surgeon: Marcine Matar, MD;  Location: [facility];  Service: Urology;  Laterality: Right;  kidney     Home Medications:  No prescriptions prior to admission    Allergies:  Allergies  Allergen Reactions  . Sulfa Antibiotics Hives    No family history on file.  Social History:  reports that she has never smoked. She does not have any smokeless tobacco history on  file. She reports that she does not drink alcohol or use illicit drugs.  ROS: A complete review of systems was performed.  All systems are negative except for pertinent findings as noted.  Physical Exam:  Vital signs in last 24 hours:   General:  Alert and oriented, No acute distress HEENT: Normocephalic, atraumatic Neck: No JVD or lymphadenopathy Cardiovascular: Regular rate and rhythm Lungs: Clear bilaterally Abdomen: Soft, nontender, nondistended, no abdominal masses Back: No CVA tenderness Extremities: No edema Neurologic: Grossly intact  Laboratory Data:  No results found for this or any previous visit (from the past 24 hour(s)). No results found for this or any previous visit (from the past 240 hour(s)). Creatinine: No results found for this basename: CREATININE:7 in the last 168 hours  Radiologic Imaging: No results found.  Impression/Assessment:  Persistent right renal calculi  UTI(enterococcal)--treated  Plan:  Right ureteroscopy with laser/extracion of renal calculi  Chelsea Aus 11/04/2011, 5:54 AM  Bertram Millard. Jiovany Scheffel MD

## 2011-11-06 MED ORDER — GENTAMICIN SULFATE 40 MG/ML IJ SOLN
450.0000 mg | INTRAVENOUS | Status: AC
  Administered 2011-11-07: 450 mg via INTRAVENOUS
  Filled 2011-11-06: qty 11.25

## 2011-11-07 ENCOUNTER — Encounter (HOSPITAL_COMMUNITY): Payer: Self-pay | Admitting: *Deleted

## 2011-11-07 ENCOUNTER — Ambulatory Visit (HOSPITAL_COMMUNITY): Payer: Medicare Other | Admitting: Anesthesiology

## 2011-11-07 ENCOUNTER — Encounter (HOSPITAL_COMMUNITY): Payer: Self-pay | Admitting: Anesthesiology

## 2011-11-07 ENCOUNTER — Inpatient Hospital Stay (HOSPITAL_COMMUNITY)
Admission: RE | Admit: 2011-11-07 | Discharge: 2011-11-08 | DRG: 694 | Disposition: A | Discharge status: Skilled Nursing Facility | Payer: Medicare Other | Source: Ambulatory Visit | Attending: Urology | Admitting: Urology

## 2011-11-07 ENCOUNTER — Encounter (HOSPITAL_COMMUNITY)
Admission: RE | Disposition: A | Discharge status: Skilled Nursing Facility | Payer: Self-pay | Source: Ambulatory Visit | Attending: Urology

## 2011-11-07 DIAGNOSIS — I10 Essential (primary) hypertension: Secondary | ICD-10-CM | POA: Diagnosis present

## 2011-11-07 DIAGNOSIS — E039 Hypothyroidism, unspecified: Secondary | ICD-10-CM | POA: Diagnosis present

## 2011-11-07 DIAGNOSIS — J4489 Other specified chronic obstructive pulmonary disease: Secondary | ICD-10-CM | POA: Diagnosis present

## 2011-11-07 DIAGNOSIS — G809 Cerebral palsy, unspecified: Secondary | ICD-10-CM | POA: Diagnosis present

## 2011-11-07 DIAGNOSIS — F411 Generalized anxiety disorder: Secondary | ICD-10-CM | POA: Diagnosis present

## 2011-11-07 DIAGNOSIS — J449 Chronic obstructive pulmonary disease, unspecified: Secondary | ICD-10-CM | POA: Diagnosis present

## 2011-11-07 DIAGNOSIS — Z6836 Body mass index (BMI) 36.0-36.9, adult: Secondary | ICD-10-CM

## 2011-11-07 DIAGNOSIS — N390 Urinary tract infection, site not specified: Secondary | ICD-10-CM | POA: Diagnosis present

## 2011-11-07 DIAGNOSIS — N200 Calculus of kidney: Principal | ICD-10-CM | POA: Diagnosis present

## 2011-11-07 HISTORY — PX: STONE EXTRACTION WITH BASKET: SHX5318

## 2011-11-07 HISTORY — PX: CYSTOSCOPY WITH URETEROSCOPY: SHX5123

## 2011-11-07 LAB — SURGICAL PCR SCREEN: Staphylococcus aureus: POSITIVE — AB

## 2011-11-07 SURGERY — CYSTOSCOPY WITH URETEROSCOPY
Anesthesia: General | Laterality: Right | Wound class: Clean Contaminated

## 2011-11-07 MED ORDER — KETOROLAC TROMETHAMINE 30 MG/ML IJ SOLN: 15.0000 mg | Freq: Once | INTRAMUSCULAR | Status: DC | PRN

## 2011-11-07 MED ORDER — FENTANYL CITRATE 0.05 MG/ML IJ SOLN
25.0000 ug | INTRAMUSCULAR | Status: DC | PRN
  Administered 2011-11-07: 25 ug via INTRAVENOUS

## 2011-11-07 MED ORDER — CITALOPRAM HYDROBROMIDE 40 MG PO TABS
40.0000 mg | ORAL_TABLET | Freq: Every day | ORAL | Status: DC
  Administered 2011-11-07: 40 mg via ORAL
  Filled 2011-11-07 (×2): qty 1

## 2011-11-07 MED ORDER — LACTATED RINGERS IV SOLN: INTRAVENOUS | Status: DC

## 2011-11-07 MED ORDER — SUCCINYLCHOLINE CHLORIDE 20 MG/ML IJ SOLN
INTRAMUSCULAR | Status: DC | PRN
  Administered 2011-11-07: 100 mg via INTRAVENOUS

## 2011-11-07 MED ORDER — BUDESONIDE-FORMOTEROL FUMARATE 160-4.5 MCG/ACT IN AERO
1.0000 | INHALATION_SPRAY | Freq: Two times a day (BID) | RESPIRATORY_TRACT | Status: DC
  Administered 2011-11-07 – 2011-11-08 (×2): 1 via RESPIRATORY_TRACT
  Filled 2011-11-07: qty 6

## 2011-11-07 MED ORDER — OXYCODONE HCL 5 MG PO TABS
5.0000 mg | ORAL_TABLET | Freq: Two times a day (BID) | ORAL | Status: DC
  Administered 2011-11-07 – 2011-11-08 (×3): 5 mg via ORAL
  Filled 2011-11-07 (×3): qty 1

## 2011-11-07 MED ORDER — IOHEXOL 300 MG/ML  SOLN
INTRAMUSCULAR | Status: AC
  Filled 2011-11-07: qty 1

## 2011-11-07 MED ORDER — SALINE SPRAY 0.65 % NA SOLN
1.0000 | Freq: Two times a day (BID) | NASAL | Status: DC
  Administered 2011-11-07 – 2011-11-08 (×2): 1 via NASAL
  Filled 2011-11-07: qty 44

## 2011-11-07 MED ORDER — SODIUM CHLORIDE 0.45 % IV SOLN
INTRAVENOUS | Status: DC
  Administered 2011-11-07: 12:00:00 via INTRAVENOUS
  Administered 2011-11-08: 75 mL/h via INTRAVENOUS

## 2011-11-07 MED ORDER — LACTULOSE 10 GM/15ML PO SOLN
20.0000 g | Freq: Every evening | ORAL | Status: DC | PRN
  Filled 2011-11-07: qty 30

## 2011-11-07 MED ORDER — HYDROMORPHONE HCL PF 1 MG/ML IJ SOLN
0.5000 mg | INTRAMUSCULAR | Status: DC | PRN
  Administered 2011-11-07: 0.5 mg via INTRAVENOUS
  Administered 2011-11-08 (×2): 1 mg via INTRAVENOUS
  Filled 2011-11-07 (×3): qty 1

## 2011-11-07 MED ORDER — PROMETHAZINE HCL 25 MG/ML IJ SOLN: 6.2500 mg | INTRAMUSCULAR | Status: DC | PRN

## 2011-11-07 MED ORDER — LAMOTRIGINE 100 MG PO TABS
100.0000 mg | ORAL_TABLET | Freq: Two times a day (BID) | ORAL | Status: DC
  Administered 2011-11-07 – 2011-11-08 (×3): 100 mg via ORAL
  Filled 2011-11-07 (×4): qty 1

## 2011-11-07 MED ORDER — LACTATED RINGERS IV SOLN
INTRAVENOUS | Status: DC | PRN
  Administered 2011-11-07: 08:00:00 via INTRAVENOUS

## 2011-11-07 MED ORDER — CLONAZEPAM 1 MG PO TABS
1.0000 mg | ORAL_TABLET | Freq: Two times a day (BID) | ORAL | Status: DC
  Administered 2011-11-07 – 2011-11-08 (×3): 1 mg via ORAL
  Filled 2011-11-07 (×3): qty 1

## 2011-11-07 MED ORDER — SIMVASTATIN 20 MG PO TABS
20.0000 mg | ORAL_TABLET | Freq: Every day | ORAL | Status: DC
  Administered 2011-11-07: 20 mg via ORAL
  Filled 2011-11-07 (×2): qty 1

## 2011-11-07 MED ORDER — ONDANSETRON HCL 4 MG/2ML IJ SOLN
4.0000 mg | INTRAMUSCULAR | Status: DC | PRN
  Administered 2011-11-08: 4 mg via INTRAVENOUS
  Filled 2011-11-07: qty 2

## 2011-11-07 MED ORDER — FLUTICASONE PROPIONATE 50 MCG/ACT NA SUSP
2.0000 | Freq: Every morning | NASAL | Status: DC
  Administered 2011-11-07 – 2011-11-08 (×2): 2 via NASAL
  Filled 2011-11-07: qty 16

## 2011-11-07 MED ORDER — PROPOFOL 10 MG/ML IV BOLUS
INTRAVENOUS | Status: DC | PRN
  Administered 2011-11-07: 150 mg via INTRAVENOUS

## 2011-11-07 MED ORDER — RISPERIDONE 2 MG PO TABS
2.0000 mg | ORAL_TABLET | Freq: Two times a day (BID) | ORAL | Status: DC
Start: 2011-11-07 — End: 2011-11-08
  Administered 2011-11-07 – 2011-11-08 (×3): 2 mg via ORAL
  Filled 2011-11-07 (×6): qty 1

## 2011-11-07 MED ORDER — BACLOFEN 10 MG PO TABS
10.0000 mg | ORAL_TABLET | Freq: Four times a day (QID) | ORAL | Status: DC
Start: 2011-11-07 — End: 2011-11-08
  Administered 2011-11-07 – 2011-11-08 (×5): 10 mg via ORAL
  Filled 2011-11-07 (×7): qty 1

## 2011-11-07 MED ORDER — LORAZEPAM 1 MG PO TABS: 1.0000 mg | ORAL_TABLET | ORAL | Status: DC | PRN

## 2011-11-07 MED ORDER — VENLAFAXINE HCL 50 MG PO TABS
50.0000 mg | ORAL_TABLET | Freq: Every morning | ORAL | Status: DC
  Administered 2011-11-07 – 2011-11-08 (×2): 50 mg via ORAL
  Filled 2011-11-07 (×2): qty 1

## 2011-11-07 MED ORDER — LEVETIRACETAM 250 MG PO TABS
250.0000 mg | ORAL_TABLET | Freq: Two times a day (BID) | ORAL | Status: DC
  Administered 2011-11-07 – 2011-11-08 (×3): 250 mg via ORAL
  Filled 2011-11-07 (×4): qty 1

## 2011-11-07 MED ORDER — LEVOTHYROXINE SODIUM 25 MCG PO TABS
25.0000 ug | ORAL_TABLET | Freq: Every day | ORAL | Status: DC
  Administered 2011-11-08: 25 ug via ORAL
  Filled 2011-11-07 (×2): qty 1

## 2011-11-07 MED ORDER — NITROGLYCERIN 0.4 MG SL SUBL: 0.4000 mg | SUBLINGUAL_TABLET | SUBLINGUAL | Status: DC | PRN

## 2011-11-07 MED ORDER — LORATADINE 10 MG PO TABS
10.0000 mg | ORAL_TABLET | Freq: Every day | ORAL | Status: DC
  Administered 2011-11-07 – 2011-11-08 (×2): 10 mg via ORAL
  Filled 2011-11-07 (×2): qty 1

## 2011-11-07 MED ORDER — KETOTIFEN FUMARATE 0.025 % OP SOLN: 1.0000 [drp] | Freq: Two times a day (BID) | OPHTHALMIC | Status: DC

## 2011-11-07 MED ORDER — MONTELUKAST SODIUM 10 MG PO TABS
10.0000 mg | ORAL_TABLET | Freq: Every morning | ORAL | Status: DC
  Administered 2011-11-07 – 2011-11-08 (×2): 10 mg via ORAL
  Filled 2011-11-07 (×2): qty 1

## 2011-11-07 MED ORDER — PANTOPRAZOLE SODIUM 40 MG PO TBEC
40.0000 mg | DELAYED_RELEASE_TABLET | Freq: Every day | ORAL | Status: DC
  Administered 2011-11-07 – 2011-11-08 (×2): 40 mg via ORAL
  Filled 2011-11-07: qty 1

## 2011-11-07 MED ORDER — 0.9 % SODIUM CHLORIDE (POUR BTL) OPTIME
TOPICAL | Status: DC | PRN
  Administered 2011-11-07: 1000 mL

## 2011-11-07 MED ORDER — MUPIROCIN 2 % EX OINT
TOPICAL_OINTMENT | Freq: Two times a day (BID) | CUTANEOUS | Status: DC
  Administered 2011-11-07: 1 via NASAL
  Filled 2011-11-07: qty 22

## 2011-11-07 MED ORDER — SODIUM CHLORIDE 0.9 % IR SOLN
Status: DC | PRN
  Administered 2011-11-07: 3000 mL

## 2011-11-07 MED ORDER — FENTANYL CITRATE 0.05 MG/ML IJ SOLN
INTRAMUSCULAR | Status: AC
  Filled 2011-11-07: qty 2

## 2011-11-07 MED ORDER — LIDOCAINE HCL (CARDIAC) 20 MG/ML IV SOLN
INTRAVENOUS | Status: DC | PRN
  Administered 2011-11-07: 50 mg via INTRAVENOUS

## 2011-11-07 MED ORDER — IOHEXOL 300 MG/ML  SOLN
INTRAMUSCULAR | Status: DC | PRN
  Administered 2011-11-07: 10 mL

## 2011-11-07 MED ORDER — FENTANYL CITRATE 0.05 MG/ML IJ SOLN
INTRAMUSCULAR | Status: DC | PRN
  Administered 2011-11-07: 25 ug via INTRAVENOUS
  Administered 2011-11-07: 100 ug via INTRAVENOUS
  Administered 2011-11-07: 50 ug via INTRAVENOUS

## 2011-11-07 MED ORDER — ALBUTEROL SULFATE HFA 108 (90 BASE) MCG/ACT IN AERS: 2.0000 | INHALATION_SPRAY | RESPIRATORY_TRACT | Status: DC | PRN

## 2011-11-07 MED ORDER — ONDANSETRON HCL 4 MG/2ML IJ SOLN
INTRAMUSCULAR | Status: DC | PRN
  Administered 2011-11-07: 4 mg via INTRAVENOUS

## 2011-11-07 MED ORDER — STERILE WATER FOR IRRIGATION IR SOLN
Status: DC | PRN
  Administered 2011-11-07: 500 mL

## 2011-11-07 MED ORDER — HYDROCODONE-ACETAMINOPHEN 10-325 MG PO TABS
1.0000 | ORAL_TABLET | ORAL | Status: DC | PRN
  Administered 2011-11-08: 1 via ORAL
  Filled 2011-11-07: qty 1

## 2011-11-07 MED ORDER — PIPERACILLIN-TAZOBACTAM 3.375 G IVPB
3.3750 g | Freq: Three times a day (TID) | INTRAVENOUS | Status: DC
  Administered 2011-11-07 – 2011-11-08 (×3): 3.375 g via INTRAVENOUS
  Filled 2011-11-07 (×5): qty 50

## 2011-11-07 MED ORDER — INDIGOTINDISULFONATE SODIUM 8 MG/ML IJ SOLN
INTRAMUSCULAR | Status: AC
  Filled 2011-11-07: qty 5

## 2011-11-07 SURGICAL SUPPLY — 24 items
ADAPTER CATH URET PLST 4-6FR (CATHETERS) ×1 IMPLANT
ADPR CATH URET STRL DISP 4-6FR (CATHETERS)
BAG URINE DRAINAGE (UROLOGICAL SUPPLIES) ×1 IMPLANT
BAG URO CATCHER STRL LF (DRAPE) ×2 IMPLANT
BASKET ZERO TIP NITINOL 2.4FR (BASKET) ×1 IMPLANT
BSKT STON RTRVL ZERO TP 2.4FR (BASKET) ×1
CATH FOLEY 2WAY SLVR  5CC 18FR (CATHETERS) ×1
CATH FOLEY 2WAY SLVR 5CC 18FR (CATHETERS) IMPLANT
CATH INTERMIT  6FR 70CM (CATHETERS) IMPLANT
CATH URET 5FR 28IN OPEN ENDED (CATHETERS) ×1 IMPLANT
CLOTH BEACON ORANGE TIMEOUT ST (SAFETY) ×2 IMPLANT
DRAPE CAMERA CLOSED 9X96 (DRAPES) ×2 IMPLANT
FIBER LASER TRAC TIP (UROLOGICAL SUPPLIES) ×1 IMPLANT
GLOVE BIOGEL M 8.0 STRL (GLOVE) ×2 IMPLANT
GLOVE SURG SS PI 8.0 STRL IVOR (GLOVE) ×2 IMPLANT
GOWN PREVENTION PLUS XLARGE (GOWN DISPOSABLE) ×2 IMPLANT
GOWN STRL REIN XL XLG (GOWN DISPOSABLE) ×2 IMPLANT
GUIDEWIRE STR DUAL SENSOR (WIRE) ×1 IMPLANT
MANIFOLD NEPTUNE II (INSTRUMENTS) ×2 IMPLANT
MARKER SKIN DUAL TIP RULER LAB (MISCELLANEOUS) ×1 IMPLANT
PACK CYSTO (CUSTOM PROCEDURE TRAY) ×2 IMPLANT
SHEATH ACCESS URETERAL 38CM (SHEATH) ×1 IMPLANT
STENT CONTOUR 6FRX24X.038 (STENTS) ×1 IMPLANT
TUBING CONNECTING 10 (TUBING) ×2 IMPLANT

## 2011-11-07 NOTE — Brief Op Note (Signed)
Right Renal stones sent with Dr. Retta Diones.

## 2011-11-07 NOTE — Op Note (Signed)
Preoperative diagnosis: Right renal calculi, status post percutaneous nephrolithotomy  Postoperative diagnosis: Same  Principal procedure: Cystoscopy, right double-J stent extraction, rigid and flexible ureteroscopy on the right, holmium laser fragmentation of multiple right renal calculi, extraction of multiple right renal calculi, right double-J stent placement  Surgeon: Aashvi Rezabek  Anesthesia: Gen. with LMA  Complications: None  Specimens: Stones  Drains: 18 French Foley catheter, 24 cm x 6 French contour stent without string  Indications: 46 year-old female with cerebral palsy, and a history of a large right renal calculus that was infected. She was initially treated with a percutaneous nephrostomy tube drainage, IV antibiotics, followed by percutaneous nephrolithotomy in August, 2013. The percutaneous nephrolithotomy was a difficult procedure, her kidney was very friable, and although most of the stone burden was removed with a percutaneous procedure, I did not think it feasible and was not comfortable with repeat percutaneous nephrostomy, due to the amount of bleeding she had as well as the friability of her collecting system. She has been on IV vancomycin and has a stent and nephrostomy tube still in place. I feel it worthwhile to perform a retrograde treatment of her renal calculi. The patient has been on recent vancomycin for enterococcal UTI. He presents for ureteroscopic stone management.  Description of procedure: The patient was properly identified and marked in the holding area. She received preoperative IV gentamicin. She was taken the operating room where general anesthetic was administered with the LMA. She was placed in the dorsolithotomy position. Genitalia perineum were prepped and draped. Proper timeout was then performed.  A 22 French panendoscope was advanced to the bladder which was inspected and found normal. Her ureteral stent was partially removed, and a guidewire placed  up through the the left renal pelvis using fluoroscopic guidance for I then passed a rigid ureteroscope up into the renal pelvis. A few stones were seen but could not be snared with the basket. I then removed the ureteroscope, passed a 15 French ureteral access sheath over the guidewire all the way to the renal pelvis. The inner core was removed, and a 6 [place] digital ureteroscope was placed. Multiple stones were encountered, the smallest ones were easily extracted. The larger ones could not be extracted through the sheath, and were fragmented into multiple smaller fragments using the holmium laser and a 200  fiber. The multiple fragments were then extracted. Multiple small sand-like fragments were too small to be grasped. I spent approximately 1 hour and 45 minutes on this task of snaring and extracted the smaller stones. There was a large stone in the lower pole calyx which was brought up to the renal pelvis and treated. Systematic inspection of all calyces was performed. I did not see any large fragments remaining. Because of the large burden of small fragments remaining, with them difficult to be snared, I felt it would be worthwhile to place a stent and allow for the small fragments to pass, with the patient making her left side dependent, hopefully. At this point I removed the ureteral access sheath, and placed a 24 [place], 6 [place] contour catheter with a tether removed. Good proximal and distal curls were seen following this. The bladder was properly drained with a Foley catheter. The patient was awakened and taken to the PACU in stable condition. She tolerated the procedure well.

## 2011-11-07 NOTE — Anesthesia Preprocedure Evaluation (Addendum)
Anesthesia Evaluation  Patient identified by MRN, date of birth, ID band Patient awake    Reviewed: Allergy & Precautions, H&P , NPO status , Patient's Chart, lab work & pertinent test results  Airway Mallampati: III TM Distance: <3 FB Neck ROM: Full    Dental No notable dental hx.    Pulmonary COPD   + decreased breath sounds      Cardiovascular hypertension, Pt. on medications Rhythm:Regular Rate:Normal     Neuro/Psych Anxiety CP    GI/Hepatic negative GI ROS, Neg liver ROS,   Endo/Other  Hypothyroidism Morbid obesity  Renal/GU negative Renal ROS  negative genitourinary   Musculoskeletal negative musculoskeletal ROS (+)   Abdominal (+) + obese,   Peds negative pediatric ROS (+)  Hematology negative hematology ROS (+)   Anesthesia Other Findings   Reproductive/Obstetrics negative OB ROS                          Anesthesia Physical Anesthesia Plan  ASA: III  Anesthesia Plan: General   Post-op Pain Management:    Induction: Intravenous  Airway Management Planned: Oral ETT  Additional Equipment:   Intra-op Plan:   Post-operative Plan: Extubation in OR and Possible Post-op intubation/ventilation  Informed Consent: I have reviewed the patients History and Physical, chart, labs and discussed the procedure including the risks, benefits and alternatives for the proposed anesthesia with the patient or authorized representative who has indicated his/her understanding and acceptance.   Dental advisory given  Plan Discussed with: CRNA and Surgeon  Anesthesia Plan Comments:        Anesthesia Quick Evaluation

## 2011-11-07 NOTE — Transfer of Care (Signed)
Immediate Anesthesia Transfer of Care Note  Patient: Kristi Perry  Procedure(s) Performed: Procedure(s) (LRB): CYSTOSCOPY WITH URETEROSCOPY (Right) HOLMIUM LASER APPLICATION (Right) STONE EXTRACTION WITH BASKET (Right)  Patient Location: PACU  Anesthesia Type: General  Level of Consciousness: sedated, patient cooperative and responds to stimulaton  Airway & Oxygen Therapy: Patient Spontanous Breathing and Patient connected to face mask oxgen  Post-op Assessment: Report given to PACU RN and Post -op Vital signs reviewed and stable  Post vital signs: Reviewed and stable  Complications: No apparent anesthesia complications

## 2011-11-07 NOTE — Anesthesia Postprocedure Evaluation (Signed)
  Anesthesia Post-op Note  Patient: Kristi Perry  Procedure(s) Performed: Procedure(s) (LRB): CYSTOSCOPY WITH URETEROSCOPY (Right) HOLMIUM LASER APPLICATION (Right) STONE EXTRACTION WITH BASKET (Right)  Patient Location: PACU  Anesthesia Type: General  Level of Consciousness: awake and alert   Airway and Oxygen Therapy: Patient Spontanous Breathing  Post-op Pain: mild  Post-op Assessment: Post-op Vital signs reviewed, Patient's Cardiovascular Status Stable, Respiratory Function Stable, Patent Airway and No signs of Nausea or vomiting  Post-op Vital Signs: stable  Complications: No apparent anesthesia complications

## 2011-11-08 ENCOUNTER — Encounter (HOSPITAL_COMMUNITY): Payer: Self-pay | Admitting: Urology

## 2011-11-08 MED ORDER — HEPARIN SOD (PORK) LOCK FLUSH 100 UNIT/ML IV SOLN
250.0000 [IU] | INTRAVENOUS | Status: AC | PRN
  Administered 2011-11-08: 250 [IU]

## 2011-11-08 MED ORDER — CIPROFLOXACIN HCL 500 MG PO TABS: 500.0000 mg | ORAL_TABLET | Freq: Two times a day (BID) | ORAL | Status: AC

## 2011-11-08 NOTE — Progress Notes (Signed)
Patient cleared for discharge. Packet copied and placed in [facility]. ptar called for transportation.  Sarabella Caprio C. Omri Bertran MSW, LCSW [phone]  

## 2011-11-08 NOTE — Progress Notes (Signed)
PICC line capped off, flushed with 10cc NS, GBR followed by heparin 2.68ml (100u/ml) to dwell in port.  Kristi Perry

## 2011-11-08 NOTE — Care Management Note (Signed)
    Page 1 of 1   11/08/2011     1:33:04 PM   CARE MANAGEMENT NOTE 11/08/2011  Patient:  DULCINEA, KINSER   Account Number:  [id number]  Date Initiated:  11/08/2011  Documentation initiated by:  Lanier Clam  Subjective/Objective Assessment:   ADMITTED W/RENAL CALCULI.READMIT 8/5-10/11/11.     Action/Plan:   FROM SNF.   Anticipated DC Date:  11/08/2011   Anticipated DC Plan:  SKILLED NURSING FACILITY  In-house referral  Clinical Social Worker      DC Planning Services  CM consult      Choice offered to / List presented to:             Status of service:  Completed, signed off Medicare Important Message given?  NA - LOS <3 / Initial given by admissions (If response is "NO", the following Medicare IM given date fields will be blank) Date Medicare IM given:   Date Additional Medicare IM given:    Discharge Disposition:  SKILLED NURSING FACILITY  Per UR Regulation:  Reviewed for med. necessity/level of care/duration of stay  If discussed at Long Length of Stay Meetings, dates discussed:    Comments:  11/08/11 [facility] Natavia Sublette RN,BSN NCM [phone]

## 2011-11-08 NOTE — Progress Notes (Signed)
1 Day Post-Op Subjective: Patient reports minimal pain. She would like to speak with the social worker about changing nursing homes. According to her mother, she has had this desire before.  Objective: Vital signs in last 24 hours: Temp:  [97.6 F (36.4 C)-99.6 F (37.6 C)] 99.6 F (37.6 C) (09/06 0528) Pulse Rate:  [105-122] 111  (09/06 0641) Resp:  [10-20] 20  (09/06 0528) BP: (116-138)/(52-83) 116/67 mmHg (09/06 0528) SpO2:  [68 %-97 %] 95 % (09/06 0641) Weight:  [93.5 kg (206 lb 2.1 oz)] 93.5 kg (206 lb 2.1 oz) (09/05 1234)  Intake/Output from previous day: 09/05 0701 - 09/06 0700 In: 2558.8 [I.V.:2408.8; IV Piggyback:150] Out: 1075 [Urine:1075] Intake/Output this shift:    Physical Exam:  Constitutional: Vital signs reviewed. WD WN in NAD     Lab Results: No results found for this basename: HGB:3,HCT:3 in the last 72 hours BMET No results found for this basename: NA:2,K:2,CL:2,CO2:2,GLUCOSE:2,BUN:2,CREATININE:2,CALCIUM:2 in the last 72 hours No results found for this basename: LABPT:3,INR:3 in the last 72 hours No results found for this basename: LABURIN:1 in the last 72 hours Results for orders placed during the hospital encounter of 11/07/11  SURGICAL PCR SCREEN     Status: Abnormal   Collection Time   11/07/11  6:27 AM      Component Value Range Status Comment   MRSA, PCR NEGATIVE  NEGATIVE Final    Staphylococcus aureus POSITIVE (*) NEGATIVE Final     Studies/Results: No results found.  Assessment/Plan:   Postoperative day #1 ureteroscopy, holmium laser and extraction of multiple renal calculi. I'm sure she has a few fragments remaining, hopefully these will be small enough to pass on her stent, especially if she is placed in the left lateral decubitus position.    I will as social work to see her today. Once this has been taken care of, I would anticipate transfer back either this afternoon or tomorrow.   LOS: 1 day   Chelsea Aus 11/08/2011,  7:52 AM
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
## 2011-11-20 ENCOUNTER — Other Ambulatory Visit: Payer: Self-pay | Admitting: Urology

## 2011-11-22 ENCOUNTER — Encounter (HOSPITAL_COMMUNITY): Payer: Self-pay | Admitting: *Deleted

## 2011-11-22 ENCOUNTER — Encounter (HOSPITAL_COMMUNITY): Payer: Self-pay | Admitting: Pharmacy Technician

## 2011-11-28 ENCOUNTER — Emergency Department (HOSPITAL_COMMUNITY): Payer: Medicare Other

## 2011-11-28 ENCOUNTER — Emergency Department (HOSPITAL_COMMUNITY)
Admission: EM | Admit: 2011-11-28 | Discharge: 2011-11-28 | Disposition: A | Discharge status: Skilled Nursing Facility | Payer: Medicare Other | Attending: Emergency Medicine | Admitting: Emergency Medicine

## 2011-11-28 ENCOUNTER — Encounter (HOSPITAL_COMMUNITY): Payer: Self-pay | Admitting: Emergency Medicine

## 2011-11-28 DIAGNOSIS — R1084 Generalized abdominal pain: Secondary | ICD-10-CM | POA: Insufficient documentation

## 2011-11-28 DIAGNOSIS — Z79899 Other long term (current) drug therapy: Secondary | ICD-10-CM | POA: Insufficient documentation

## 2011-11-28 DIAGNOSIS — I10 Essential (primary) hypertension: Secondary | ICD-10-CM | POA: Insufficient documentation

## 2011-11-28 DIAGNOSIS — Z936 Other artificial openings of urinary tract status: Secondary | ICD-10-CM | POA: Insufficient documentation

## 2011-11-28 DIAGNOSIS — R109 Unspecified abdominal pain: Secondary | ICD-10-CM

## 2011-11-28 DIAGNOSIS — G894 Chronic pain syndrome: Secondary | ICD-10-CM | POA: Insufficient documentation

## 2011-11-28 DIAGNOSIS — G809 Cerebral palsy, unspecified: Secondary | ICD-10-CM | POA: Insufficient documentation

## 2011-11-28 LAB — CBC WITH DIFFERENTIAL/PLATELET
Basophils Absolute: 0 10*3/uL (ref 0.0–0.1)
Eosinophils Absolute: 0.3 10*3/uL (ref 0.0–0.7)
Lymphocytes Relative: 22 % (ref 12–46)
Lymphs Abs: 1.6 10*3/uL (ref 0.7–4.0)
MCH: 28.5 pg (ref 26.0–34.0)
Neutrophils Relative %: 61 % (ref 43–77)
Platelets: 233 10*3/uL (ref 150–400)
RBC: 4.32 MIL/uL (ref 3.87–5.11)
RDW: 14.3 % (ref 11.5–15.5)
WBC: 7.3 10*3/uL (ref 4.0–10.5)

## 2011-11-28 LAB — BASIC METABOLIC PANEL
Calcium: 10.2 mg/dL (ref 8.4–10.5)
GFR calc non Af Amer: >90 mL/min (ref >90)
Glucose, Bld: 86 mg/dL (ref 70–99)
Potassium: 4.8 mEq/L (ref 3.5–5.1)
Sodium: 137 mEq/L (ref 135–145)

## 2011-11-28 MED ORDER — MORPHINE SULFATE 4 MG/ML IJ SOLN
6.0000 mg | Freq: Once | INTRAMUSCULAR | Status: AC
  Administered 2011-11-28: 6 mg via INTRAVENOUS
  Filled 2011-11-28: qty 2

## 2011-11-28 MED ORDER — SODIUM CHLORIDE 0.9 % IV SOLN
Freq: Once | INTRAVENOUS | Status: AC
  Administered 2011-11-28: 20 mL/h via INTRAVENOUS

## 2011-11-28 MED ORDER — IOHEXOL 300 MG/ML  SOLN
100.0000 mL | Freq: Once | INTRAMUSCULAR | Status: AC | PRN
  Administered 2011-11-28: 100 mL via INTRAVENOUS

## 2011-11-28 NOTE — ED Notes (Signed)
Attempted to call report called to [facility], on hold for 5 minutes.

## 2011-11-28 NOTE — ED Notes (Addendum)
Per [facility], pt has an abscess at her nephrostomy site.  Pt c/o pain 10/10.  Kidney stone has "been broken up", but still present.  Supposed to have a stent placed to help removal.

## 2011-11-28 NOTE — ED Provider Notes (Signed)
History     CSN: [id number]  Arrival date & time 11/28/11  1124   First MD Initiated Contact with Patient 11/28/11 1135      Chief Complaint  Patient presents with  . Abscess    Nephrostomy     The history is provided by the patient, medical records and the nursing home.   I spoke with the nursing home as reported that the patient had a small amount of drainage concerning for abscess, around the outside of her right nephrostomy tube today and that she was sent to the emergency department.  She's had no fever per the nursing home.  The patient reports that she's had a nephrostomy tube in her right flank for approximately 3 weeks after she had kidney stones removed from right kidney with a complicated course.  The patient reports no nausea or vomiting.  She's had no diarrhea.  She denies abdominal pain.  She's a history of ongoing chronic pain reports that she has generalized pain that is moderate to severe at this time.  She has scheduled followup with the urologist.  She has not noted any drainage out of her nephrostomy to herself.  Past Medical History  Diagnosis Date  . Sepsis   . Hypertension   . DVT (deep vein thrombosis) in pregnancy   . Depression   . Chronic pain syndrome   . Allergic rhinitis   . Acute respiratory failure   . Epilepsy   . Migraine   . Chronic airway obstruction   . Cerebral palsy   . Dysphagia   . Hiatal hernia   . Morbid obesity 09-25-11    requires Hoyer lift. pt. doesn't stand or ambulate.  . Incontinent of urine 09-25-11    hx.  . Incontinent of feces 09-25-11    hx.  . Vitamin d deficiency   . Sepsis     hx of   . Hyperlipidemia   . Mild intellectual disabilities   . Neurogenic bladder   . Hypothyroidism   . Anxiety   . Unspecified psychosis   . Muscle weakness   . Dysphasia   . Pneumonia     hx of asp pneumonia 1/11-1/19/12  . Sacral decubitus ulcer     hx of    Past Surgical History  Procedure Date  . Nephrolithotomy 10/07/2011   Procedure: NEPHROLITHOTOMY PERCUTANEOUS;  Surgeon: Marcine Matar, MD;  Location: [facility];  Service: Urology;  Laterality: Right;  kidney   . Cystoscopy with ureteroscopy 11/07/2011    Procedure: CYSTOSCOPY WITH URETEROSCOPY;  Surgeon: Marcine Matar, MD;  Location: [facility];  Service: Urology;  Laterality: Right;  Removal of right double J stent, Insertion right double J stent  . Stone extraction with basket 11/07/2011    Procedure: STONE EXTRACTION WITH BASKET;  Surgeon: Marcine Matar, MD;  Location: [facility];  Service: Urology;  Laterality: Right;    No family history on file.  History  Substance Use Topics  . Smoking status: Never Smoker   . Smokeless tobacco: Not on file  . Alcohol Use: No    OB History    Grav Para Term Preterm Abortions TAB SAB Ect Mult Living                  Review of Systems  All other systems reviewed and are negative.    Allergies  Sulfa antibiotics and Tape  Home Medications   Current Outpatient Rx  Name Route Sig Dispense Refill  . ACETAMINOPHEN 500 MG PO  TABS Oral Take 1,000 mg by mouth 2 (two) times daily.    . ALBUTEROL SULFATE HFA 108 (90 BASE) MCG/ACT IN AERS Inhalation Inhale 2 puffs into the lungs every 4 (four) hours as needed. For wheezing or shortness of breath    . ASPIRIN EC 81 MG PO TBEC Oral Take 81 mg by mouth daily.    Marland Kitchen BACLOFEN 10 MG PO TABS Oral Take 10 mg by mouth 4 (four) times daily.     . BUDESONIDE-FORMOTEROL FUMARATE 160-4.5 MCG/ACT IN AERO Inhalation Inhale 1 puff into the lungs 2 (two) times daily.    Marland Kitchen VITAMIN D 400 UNITS PO CAPS Oral Take 400 Units by mouth every morning.     Marland Kitchen CIPROFLOXACIN HCL 500 MG PO TABS Oral Take 500 mg by mouth 2 (two) times daily.    Marland Kitchen CITALOPRAM HYDROBROMIDE 40 MG PO TABS Oral Take 40 mg by mouth at bedtime.    Marland Kitchen CLONAZEPAM 1 MG PO TABS Oral Take 1 mg by mouth 2 (two) times daily.     Marland Kitchen FEXOFENADINE-PSEUDOEPHED ER 180-240 MG PO TB24 Oral Take 1 tablet by mouth every morning.    Marland Kitchen  FLUTICASONE PROPIONATE 50 MCG/ACT NA SUSP Nasal Place 2 sprays into the nose every morning.     Marland Kitchen HYDROCODONE-ACETAMINOPHEN 10-325 MG PO TABS Oral Take 1 tablet by mouth at bedtime as needed. For pain    . KETOTIFEN FUMARATE 0.025 % OP SOLN Both Eyes Place 1 drop into both eyes 2 (two) times daily.    Marland Kitchen LACTULOSE 10 GM/15ML PO SOLN Oral Take 20 g by mouth at bedtime as needed. For constipation    . LAMOTRIGINE 100 MG PO TABS Oral Take 100 mg by mouth 2 (two) times daily.     Marland Kitchen LEVETIRACETAM 250 MG PO TABS Oral Take 250 mg by mouth every 12 (twelve) hours.    Marland Kitchen LEVOTHYROXINE SODIUM 25 MCG PO TABS Oral Take 25 mcg by mouth daily before breakfast.     . LORAZEPAM 1 MG PO TABS Oral Take 1 mg by mouth every 4 (four) hours as needed. For agitation    . MONTELUKAST SODIUM 10 MG PO TABS Oral Take 10 mg by mouth every morning.     . ADULT MULTIVITAMIN W/MINERALS CH Oral Take 1 tablet by mouth daily.    Marland Kitchen NITROGLYCERIN 0.4 MG SL SUBL Sublingual Place 0.4 mg under the tongue every 5 (five) minutes x 3 doses as needed. For chest pain    . OMEPRAZOLE 20 MG PO CPDR Oral Take 20 mg by mouth every morning.     . OXYCODONE HCL 5 MG PO TABS Oral Take 5 mg by mouth 2 (two) times daily. Hold for sedation    . PRAVASTATIN SODIUM 40 MG PO TABS Oral Take 40 mg by mouth every evening.     Marland Kitchen RISPERIDONE 2 MG PO TABS Oral Take 2 mg by mouth 2 (two) times daily.    . SODIUM CHLORIDE 0.65 % NA SOLN Nasal Place 1 spray into the nose 2 (two) times daily.    . VENLAFAXINE HCL ER 150 MG PO CP24 Oral Take 150 mg by mouth daily with breakfast.    . VITAMIN C 500 MG PO TABS Oral Take 500 mg by mouth 2 (two) times daily.      BP 142/73  Pulse 114  Temp 98.4 F (36.9 C) (Axillary)  Resp 20  SpO2 96%  Physical Exam  Nursing note and vitals reviewed. Constitutional: She is  oriented to person, place, and time. She appears well-developed and well-nourished. No distress.  HENT:  Head: Normocephalic and atraumatic.  Eyes: EOM  are normal.  Neck: Normal range of motion.  Cardiovascular: Normal rate, regular rhythm and normal heart sounds.   Pulmonary/Chest: Effort normal and breath sounds normal.  Abdominal: Soft. She exhibits no distension. There is no tenderness.  Musculoskeletal: Normal range of motion.       Nephrostomy tube present in the right flank.  No surrounding erythema.  No drainage surrounding the nephrostomy.  The nephrostomy continues to drain.  Neurological: She is alert and oriented to person, place, and time.  Skin: Skin is warm and dry.  Psychiatric: She has a normal mood and affect. Judgment normal.    ED Course  Procedures (including critical care time)  Labs Reviewed  CBC WITH DIFFERENTIAL - Abnormal; Notable for the following:    Monocytes Relative 14 (*)     All other components within normal limits  BASIC METABOLIC PANEL - Abnormal; Notable for the following:    Creatinine, Ser 0.46 (*)     All other components within normal limits   Ct Abdomen Pelvis W Contrast  11/28/2011  *RADIOLOGY REPORT*  Clinical Data: Abdominal pain.  Drainage around right nephrostomy tube site.  Evaluate for abscess.  CT ABDOMEN AND PELVIS WITH CONTRAST  Technique:  Multidetector CT imaging of the abdomen and pelvis was performed following the standard protocol during bolus administration of intravenous contrast.  Contrast: OMNIPAQUE IOHEXOL 300 MG/ML  SOLN  Comparison: Noncontrast CT on 10/08/2011  Findings: Right-sided percutaneous nephrostomy tube and ureteral stent remains in appropriate position.  Foley catheter is again seen within the bladder.  No evidence of hydronephrosis involving either kidney.  No evidence of perinephric fluid collections are soft tissue abscess.  No evidence of renal mass or abscess. Multiple small less than 1 cm nonobstructing stone fragments are again seen in the mid and lower pole of the right kidney.  Mild bilateral renal parenchymal scarring noted.  The other abdominal  parenchymal organs have a normal appearance. Gallbladder is unremarkable.  No soft tissue masses are identified. Uterus and adnexa are unremarkable.  Shotty retroperitoneal lymph nodes are seen, none of which are pathologically enlarged.  No other soft tissue masses are identified.  Diverticulosis is again noted, however there is no evidence of diverticulitis.  No other inflammatory process or abnormal fluid collections are identified.  IMPRESSION:  1.  Right percutaneous nephrostomy tube and internal ureteral stent in appropriate position.  No evidence of hydronephrosis, abscess, or other acute findings. 2. Stable nonobstructing right nephrolithiasis. 3.  Diverticulosis.  No radiographic evidence of diverticulitis.   Original Report Authenticated By: Danae Orleans, M.D.     I personally reviewed the imaging tests through PACS system  I reviewed available ER/hospitalization records thought the EMR   1. Right flank pain       MDM  I personally spoke with the nursing home and does not sound like there is large amount of drainage that came from around the nephrostomy tube.  At this time I don't see any signs of drainage from around her nephrostomy tube and there is no signs of cellulitis or surrounding her nephrostomy tube.  A CT scan was performed demonstrating appropriate positioning of her right nephrostomy tube and her internal ureteral stent without evidence of abscess or hydronephrosis or other acute findings.  At this time I do not believe his initial workup and needs to be performed  in the emergency department.  Discharge home with followup with PCP in the urologist as scheduled or sooner as needed.  She understands return to the emergency department for new or worsening symptoms        Lyanne Co, MD 11/28/11 1513

## 2011-11-28 NOTE — ED Notes (Signed)
HKV:[id number]<DG> Expected date:11/28/11<BR> Expected time:11:15 AM<BR> Means of arrival:Ambulance<BR> Comments:<BR> 44yoF, n/v

## 2011-12-03 ENCOUNTER — Other Ambulatory Visit: Payer: Self-pay

## 2011-12-03 ENCOUNTER — Encounter (HOSPITAL_COMMUNITY): Payer: Self-pay | Admitting: Neurology

## 2011-12-03 ENCOUNTER — Emergency Department (HOSPITAL_COMMUNITY)
Admission: EM | Admit: 2011-12-03 | Discharge: 2011-12-03 | Disposition: A | Discharge status: Home Health | Payer: Medicare Other | Attending: Emergency Medicine | Admitting: Emergency Medicine

## 2011-12-03 ENCOUNTER — Emergency Department (HOSPITAL_COMMUNITY): Payer: Medicare Other

## 2011-12-03 DIAGNOSIS — G40909 Epilepsy, unspecified, not intractable, without status epilepticus: Secondary | ICD-10-CM | POA: Insufficient documentation

## 2011-12-03 DIAGNOSIS — J4489 Other specified chronic obstructive pulmonary disease: Secondary | ICD-10-CM | POA: Insufficient documentation

## 2011-12-03 DIAGNOSIS — Z7982 Long term (current) use of aspirin: Secondary | ICD-10-CM | POA: Insufficient documentation

## 2011-12-03 DIAGNOSIS — J189 Pneumonia, unspecified organism: Secondary | ICD-10-CM | POA: Insufficient documentation

## 2011-12-03 DIAGNOSIS — G894 Chronic pain syndrome: Secondary | ICD-10-CM | POA: Insufficient documentation

## 2011-12-03 DIAGNOSIS — Z79899 Other long term (current) drug therapy: Secondary | ICD-10-CM | POA: Insufficient documentation

## 2011-12-03 DIAGNOSIS — I10 Essential (primary) hypertension: Secondary | ICD-10-CM | POA: Insufficient documentation

## 2011-12-03 DIAGNOSIS — Z86718 Personal history of other venous thrombosis and embolism: Secondary | ICD-10-CM | POA: Insufficient documentation

## 2011-12-03 DIAGNOSIS — G809 Cerebral palsy, unspecified: Secondary | ICD-10-CM | POA: Insufficient documentation

## 2011-12-03 DIAGNOSIS — R079 Chest pain, unspecified: Secondary | ICD-10-CM | POA: Insufficient documentation

## 2011-12-03 DIAGNOSIS — F411 Generalized anxiety disorder: Secondary | ICD-10-CM | POA: Insufficient documentation

## 2011-12-03 DIAGNOSIS — J449 Chronic obstructive pulmonary disease, unspecified: Secondary | ICD-10-CM | POA: Insufficient documentation

## 2011-12-03 DIAGNOSIS — E039 Hypothyroidism, unspecified: Secondary | ICD-10-CM | POA: Insufficient documentation

## 2011-12-03 DIAGNOSIS — E785 Hyperlipidemia, unspecified: Secondary | ICD-10-CM | POA: Insufficient documentation

## 2011-12-03 LAB — BASIC METABOLIC PANEL
CO2: 26 mEq/L (ref 19–32)
Calcium: 10.2 mg/dL (ref 8.4–10.5)
Chloride: 100 mEq/L (ref 96–112)
Glucose, Bld: 95 mg/dL (ref 70–99)
Potassium: 4 mEq/L (ref 3.5–5.1)
Sodium: 139 mEq/L (ref 135–145)

## 2011-12-03 LAB — CBC
Hemoglobin: 12.5 g/dL (ref 12.0–15.0)
MCH: 28.7 pg (ref 26.0–34.0)
Platelets: 200 10*3/uL (ref 150–400)
RBC: 4.36 MIL/uL (ref 3.87–5.11)
WBC: 6.8 10*3/uL (ref 4.0–10.5)

## 2011-12-03 LAB — POCT I-STAT TROPONIN I: Troponin i, poc: 0 ng/mL (ref 0.00–0.08)

## 2011-12-03 LAB — D-DIMER, QUANTITATIVE: D-Dimer, Quant: 0.32 ug/mL-FEU (ref 0.00–0.48)

## 2011-12-03 MED ORDER — ASPIRIN 81 MG PO CHEW: 324.0000 mg | CHEWABLE_TABLET | Freq: Once | ORAL | Status: DC

## 2011-12-03 MED ORDER — LORAZEPAM 2 MG/ML IJ SOLN
1.0000 mg | Freq: Once | INTRAMUSCULAR | Status: AC
  Administered 2011-12-03: 1 mg via INTRAMUSCULAR
  Filled 2011-12-03: qty 1

## 2011-12-03 MED ORDER — SODIUM CHLORIDE 0.9 % IV SOLN
1000.0000 mL | INTRAVENOUS | Status: DC
  Administered 2011-12-03: 1000 mL via INTRAVENOUS

## 2011-12-03 MED ORDER — LEVOFLOXACIN 750 MG PO TABS
750.0000 mg | ORAL_TABLET | Freq: Every day | ORAL | Status: DC
  Administered 2011-12-03: 750 mg via ORAL
  Filled 2011-12-03: qty 1

## 2011-12-03 MED ORDER — ALBUTEROL SULFATE HFA 108 (90 BASE) MCG/ACT IN AERS: 1.0000 | INHALATION_SPRAY | Freq: Four times a day (QID) | RESPIRATORY_TRACT | Status: DC | PRN

## 2011-12-03 MED ORDER — LEVOFLOXACIN 750 MG PO TABS: 750.0000 mg | ORAL_TABLET | Freq: Every day | ORAL | Status: DC
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
## 2011-12-03 NOTE — ED Provider Notes (Signed)
6:51 PM  Date: 12/03/2011  Rate: 133  Rhythm: normal sinus rhythm  QRS Axis: right  Intervals: normal  ST/T Wave abnormalities: normal  Conduction Disutrbances:none  Narrative Interpretation: Sinus tachycardia, otherwise normal.  Old EKG Reviewed: changes noted--rate was 96 on 08/20/2011.    Carleene Cooper III, MD 12/03/11 [phone]
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
## 2011-12-03 NOTE — ED Notes (Signed)
Per ems-  Pt comes from [facility]. Hx of cerebral palsy. CP 10/10 at 1700 sudden, sharp, anterior chest, no radiation. Given 324 aspirin, 2 nitro 7/10 cp. EKG unremarkable ST 105. BP 121/83. No sob, or n/v. Skin warm and dry. Pt a x 4. Pt comes with sheet of paper reporting "Per guardian do no send resident to hospital unless the doctor feels there is a medical reason to do so. When she repeatedly says she needs to go to the hospital, these are parts of her manipulative behaviors". Per facility patient guardian who is her mom is supposed to be called and approve hospital transport. Mom could not be reached at this time.

## 2011-12-03 NOTE — ED Notes (Signed)
[facility] called for pt transport

## 2011-12-06 ENCOUNTER — Ambulatory Visit (HOSPITAL_COMMUNITY): Payer: Medicare Other

## 2011-12-06 ENCOUNTER — Encounter (HOSPITAL_COMMUNITY)
Admission: RE | Disposition: A | Discharge status: Skilled Nursing Facility | Payer: Self-pay | Source: Ambulatory Visit | Attending: Urology

## 2011-12-06 ENCOUNTER — Encounter (HOSPITAL_COMMUNITY): Payer: Self-pay | Admitting: Anesthesiology

## 2011-12-06 ENCOUNTER — Encounter (HOSPITAL_COMMUNITY): Payer: Self-pay | Admitting: *Deleted

## 2011-12-06 ENCOUNTER — Observation Stay (HOSPITAL_COMMUNITY)
Admission: RE | Admit: 2011-12-06 | Discharge: 2011-12-07 | Disposition: A | Discharge status: Skilled Nursing Facility | Payer: Medicare Other | Source: Ambulatory Visit | Attending: Urology | Admitting: Urology

## 2011-12-06 ENCOUNTER — Ambulatory Visit (HOSPITAL_COMMUNITY): Payer: Medicare Other | Admitting: Anesthesiology

## 2011-12-06 DIAGNOSIS — E785 Hyperlipidemia, unspecified: Secondary | ICD-10-CM | POA: Insufficient documentation

## 2011-12-06 DIAGNOSIS — Z79899 Other long term (current) drug therapy: Secondary | ICD-10-CM | POA: Insufficient documentation

## 2011-12-06 DIAGNOSIS — Z86718 Personal history of other venous thrombosis and embolism: Secondary | ICD-10-CM | POA: Insufficient documentation

## 2011-12-06 DIAGNOSIS — I10 Essential (primary) hypertension: Secondary | ICD-10-CM | POA: Insufficient documentation

## 2011-12-06 DIAGNOSIS — Z23 Encounter for immunization: Secondary | ICD-10-CM | POA: Insufficient documentation

## 2011-12-06 DIAGNOSIS — N200 Calculus of kidney: Principal | ICD-10-CM | POA: Insufficient documentation

## 2011-12-06 HISTORY — PX: NEPHROLITHOTOMY: SHX5134

## 2011-12-06 LAB — SURGICAL PCR SCREEN
MRSA, PCR: POSITIVE — AB
Staphylococcus aureus: POSITIVE — AB

## 2011-12-06 SURGERY — NEPHROLITHOTOMY PERCUTANEOUS
Anesthesia: General | Laterality: Right | Wound class: Clean Contaminated

## 2011-12-06 MED ORDER — LORATADINE 10 MG PO TABS
10.0000 mg | ORAL_TABLET | Freq: Every day | ORAL | Status: DC
  Administered 2011-12-07: 10 mg via ORAL
  Filled 2011-12-06: qty 1

## 2011-12-06 MED ORDER — BACLOFEN 10 MG PO TABS
10.0000 mg | ORAL_TABLET | Freq: Four times a day (QID) | ORAL | Status: DC
  Administered 2011-12-06 – 2011-12-07 (×3): 10 mg via ORAL
  Filled 2011-12-06 (×5): qty 1

## 2011-12-06 MED ORDER — BUDESONIDE-FORMOTEROL FUMARATE 160-4.5 MCG/ACT IN AERO
1.0000 | INHALATION_SPRAY | Freq: Two times a day (BID) | RESPIRATORY_TRACT | Status: DC
  Administered 2011-12-07: 1 via RESPIRATORY_TRACT
  Filled 2011-12-06: qty 6

## 2011-12-06 MED ORDER — SODIUM CHLORIDE 0.45 % IV SOLN
INTRAVENOUS | Status: DC
  Administered 2011-12-06 – 2011-12-07 (×2): via INTRAVENOUS

## 2011-12-06 MED ORDER — IOHEXOL 300 MG/ML  SOLN
INTRAMUSCULAR | Status: DC | PRN
  Administered 2011-12-06: 10 mL

## 2011-12-06 MED ORDER — ONDANSETRON HCL 4 MG/2ML IJ SOLN
INTRAMUSCULAR | Status: DC | PRN
  Administered 2011-12-06: 4 mg via INTRAVENOUS

## 2011-12-06 MED ORDER — PROPOFOL 10 MG/ML IV BOLUS
INTRAVENOUS | Status: DC | PRN
  Administered 2011-12-06: 150 mg via INTRAVENOUS

## 2011-12-06 MED ORDER — HYDROMORPHONE HCL PF 1 MG/ML IJ SOLN
INTRAMUSCULAR | Status: AC
  Filled 2011-12-06: qty 1

## 2011-12-06 MED ORDER — IOHEXOL 300 MG/ML  SOLN
INTRAMUSCULAR | Status: AC
  Filled 2011-12-06: qty 2

## 2011-12-06 MED ORDER — HYDROMORPHONE HCL PF 1 MG/ML IJ SOLN: 0.5000 mg | INTRAMUSCULAR | Status: DC | PRN

## 2011-12-06 MED ORDER — KETOTIFEN FUMARATE 0.025 % OP SOLN: 1.0000 [drp] | Freq: Two times a day (BID) | OPHTHALMIC | Status: DC

## 2011-12-06 MED ORDER — FLUTICASONE PROPIONATE 50 MCG/ACT NA SUSP
2.0000 | Freq: Every morning | NASAL | Status: DC
  Administered 2011-12-07: 2 via NASAL
  Filled 2011-12-06: qty 16

## 2011-12-06 MED ORDER — ROCURONIUM BROMIDE 100 MG/10ML IV SOLN
INTRAVENOUS | Status: DC | PRN
  Administered 2011-12-06: 20 mg via INTRAVENOUS

## 2011-12-06 MED ORDER — NEOSTIGMINE METHYLSULFATE 1 MG/ML IJ SOLN
INTRAMUSCULAR | Status: DC | PRN
  Administered 2011-12-06: 4 mg via INTRAVENOUS

## 2011-12-06 MED ORDER — MUPIROCIN 2 % EX OINT
TOPICAL_OINTMENT | Freq: Two times a day (BID) | CUTANEOUS | Status: DC
  Filled 2011-12-06: qty 22

## 2011-12-06 MED ORDER — MONTELUKAST SODIUM 10 MG PO TABS
10.0000 mg | ORAL_TABLET | Freq: Every morning | ORAL | Status: DC
  Administered 2011-12-07: 10 mg via ORAL
  Filled 2011-12-06: qty 1

## 2011-12-06 MED ORDER — ALBUTEROL SULFATE HFA 108 (90 BASE) MCG/ACT IN AERS: 1.0000 | INHALATION_SPRAY | Freq: Four times a day (QID) | RESPIRATORY_TRACT | Status: DC | PRN

## 2011-12-06 MED ORDER — MUPIROCIN 2 % EX OINT: TOPICAL_OINTMENT | Freq: Two times a day (BID) | CUTANEOUS | Status: DC

## 2011-12-06 MED ORDER — PANTOPRAZOLE SODIUM 40 MG PO TBEC
40.0000 mg | DELAYED_RELEASE_TABLET | Freq: Every day | ORAL | Status: DC
Start: 2011-12-07 — End: 2011-12-07
  Administered 2011-12-07: 40 mg via ORAL
  Filled 2011-12-06: qty 1

## 2011-12-06 MED ORDER — ONDANSETRON HCL 4 MG/2ML IJ SOLN: 4.0000 mg | INTRAMUSCULAR | Status: DC | PRN

## 2011-12-06 MED ORDER — PROMETHAZINE HCL 25 MG/ML IJ SOLN: 6.2500 mg | INTRAMUSCULAR | Status: DC | PRN

## 2011-12-06 MED ORDER — AMPICILLIN-SULBACTAM SODIUM 1.5 (1-0.5) G IJ SOLR
1.5000 g | INTRAMUSCULAR | Status: AC
  Administered 2011-12-06: 1.5 g via INTRAVENOUS

## 2011-12-06 MED ORDER — ALBUTEROL SULFATE HFA 108 (90 BASE) MCG/ACT IN AERS: 2.0000 | INHALATION_SPRAY | RESPIRATORY_TRACT | Status: DC | PRN

## 2011-12-06 MED ORDER — ACETAMINOPHEN 500 MG PO TABS
1000.0000 mg | ORAL_TABLET | Freq: Two times a day (BID) | ORAL | Status: DC
  Administered 2011-12-06 – 2011-12-07 (×2): 1000 mg via ORAL
  Filled 2011-12-06 (×3): qty 2

## 2011-12-06 MED ORDER — CLONAZEPAM 1 MG PO TABS
1.0000 mg | ORAL_TABLET | Freq: Two times a day (BID) | ORAL | Status: DC
  Administered 2011-12-06 – 2011-12-07 (×2): 1 mg via ORAL
  Filled 2011-12-06 (×2): qty 1

## 2011-12-06 MED ORDER — GLYCOPYRROLATE 0.2 MG/ML IJ SOLN
INTRAMUSCULAR | Status: DC | PRN
  Administered 2011-12-06: .5 mg via INTRAVENOUS

## 2011-12-06 MED ORDER — LEVOFLOXACIN 750 MG PO TABS
750.0000 mg | ORAL_TABLET | Freq: Every day | ORAL | Status: DC
  Administered 2011-12-06: 750 mg via ORAL
  Filled 2011-12-06 (×2): qty 1

## 2011-12-06 MED ORDER — LAMOTRIGINE 100 MG PO TABS
100.0000 mg | ORAL_TABLET | Freq: Two times a day (BID) | ORAL | Status: DC
  Administered 2011-12-06 – 2011-12-07 (×2): 100 mg via ORAL
  Filled 2011-12-06 (×3): qty 1

## 2011-12-06 MED ORDER — SODIUM CHLORIDE 0.9 % IV SOLN
INTRAVENOUS | Status: AC
  Filled 2011-12-06: qty 1.5

## 2011-12-06 MED ORDER — LEVOTHYROXINE SODIUM 25 MCG PO TABS
25.0000 ug | ORAL_TABLET | Freq: Every day | ORAL | Status: DC
  Administered 2011-12-07: 25 ug via ORAL
  Filled 2011-12-06 (×2): qty 1

## 2011-12-06 MED ORDER — SUCCINYLCHOLINE CHLORIDE 20 MG/ML IJ SOLN
INTRAMUSCULAR | Status: DC | PRN
  Administered 2011-12-06: 100 mg via INTRAVENOUS

## 2011-12-06 MED ORDER — VENLAFAXINE HCL ER 150 MG PO CP24
150.0000 mg | ORAL_CAPSULE | Freq: Every day | ORAL | Status: DC
Start: 2011-12-07 — End: 2011-12-07
  Administered 2011-12-07: 150 mg via ORAL
  Filled 2011-12-06 (×2): qty 1

## 2011-12-06 MED ORDER — HYDROMORPHONE HCL PF 1 MG/ML IJ SOLN
0.2500 mg | INTRAMUSCULAR | Status: DC | PRN
  Administered 2011-12-06: 0.5 mg via INTRAVENOUS
  Administered 2011-12-06: 0.25 mg via INTRAVENOUS

## 2011-12-06 MED ORDER — ACETAMINOPHEN 10 MG/ML IV SOLN
1000.0000 mg | Freq: Four times a day (QID) | INTRAVENOUS | Status: AC
  Administered 2011-12-06 – 2011-12-07 (×4): 1000 mg via INTRAVENOUS
  Filled 2011-12-06 (×4): qty 100

## 2011-12-06 MED ORDER — SIMVASTATIN 5 MG PO TABS
5.0000 mg | ORAL_TABLET | Freq: Every day | ORAL | Status: DC
  Filled 2011-12-06: qty 1

## 2011-12-06 MED ORDER — RISPERIDONE MICROSPHERES 25 MG IM SUSR
25.0000 mg | INTRAMUSCULAR | Status: DC
  Administered 2011-12-07: 25 mg via INTRAMUSCULAR
  Filled 2011-12-06 (×2): qty 2

## 2011-12-06 MED ORDER — LACTULOSE 10 GM/15ML PO SOLN: 20.0000 g | Freq: Every day | ORAL | Status: DC | PRN

## 2011-12-06 MED ORDER — LORAZEPAM 1 MG PO TABS: 1.0000 mg | ORAL_TABLET | ORAL | Status: DC | PRN

## 2011-12-06 MED ORDER — OXYCODONE HCL 5 MG PO TABS
5.0000 mg | ORAL_TABLET | Freq: Two times a day (BID) | ORAL | Status: DC
  Administered 2011-12-06 – 2011-12-07 (×2): 5 mg via ORAL
  Filled 2011-12-06 (×2): qty 1

## 2011-12-06 MED ORDER — NITROGLYCERIN 0.4 MG SL SUBL: 0.4000 mg | SUBLINGUAL_TABLET | SUBLINGUAL | Status: DC | PRN

## 2011-12-06 MED ORDER — SODIUM CHLORIDE 0.9 % IR SOLN
Status: DC | PRN
  Administered 2011-12-06: 6000 mL

## 2011-12-06 MED ORDER — LACTATED RINGERS IV SOLN
INTRAVENOUS | Status: DC | PRN
  Administered 2011-12-06 (×3): via INTRAVENOUS

## 2011-12-06 MED ORDER — LACTATED RINGERS IV SOLN: INTRAVENOUS | Status: DC

## 2011-12-06 MED ORDER — RISPERIDONE 2 MG PO TABS
2.0000 mg | ORAL_TABLET | Freq: Two times a day (BID) | ORAL | Status: DC
  Administered 2011-12-06 – 2011-12-07 (×2): 2 mg via ORAL
  Filled 2011-12-06 (×3): qty 1

## 2011-12-06 MED ORDER — CITALOPRAM HYDROBROMIDE 40 MG PO TABS
40.0000 mg | ORAL_TABLET | Freq: Every day | ORAL | Status: DC
  Administered 2011-12-06: 40 mg via ORAL
  Filled 2011-12-06 (×2): qty 1

## 2011-12-06 MED ORDER — LEVETIRACETAM 250 MG PO TABS
250.0000 mg | ORAL_TABLET | Freq: Two times a day (BID) | ORAL | Status: DC
Start: 2011-12-06 — End: 2011-12-07
  Administered 2011-12-06 – 2011-12-07 (×2): 250 mg via ORAL
  Filled 2011-12-06 (×3): qty 1

## 2011-12-06 MED ORDER — FENTANYL CITRATE 0.05 MG/ML IJ SOLN
INTRAMUSCULAR | Status: DC | PRN
  Administered 2011-12-06 (×3): 50 ug via INTRAVENOUS
  Administered 2011-12-06: 100 ug via INTRAVENOUS

## 2011-12-06 SURGICAL SUPPLY — 43 items
APL SKNCLS STERI-STRIP NONHPOA (GAUZE/BANDAGES/DRESSINGS) ×1
BAG URINE DRAINAGE (UROLOGICAL SUPPLIES) ×2 IMPLANT
BASKET ZERO TIP NITINOL 2.4FR (BASKET) ×2 IMPLANT
BENZOIN TINCTURE PRP APPL 2/3 (GAUZE/BANDAGES/DRESSINGS) ×3 IMPLANT
BLADE SURG 15 STRL LF DISP TIS (BLADE) ×1 IMPLANT
BLADE SURG 15 STRL SS (BLADE) ×2
BSKT STON RTRVL ZERO TP 2.4FR (BASKET) ×1
CATCHER STONE W/TUBE ADAPTER (UROLOGICAL SUPPLIES) ×1 IMPLANT
CATH FOLEY 2W COUNCIL 20FR 5CC (CATHETERS) IMPLANT
CATH ROBINSON RED A/P 20FR (CATHETERS) IMPLANT
CATH X-FORCE N30 NEPHROSTOMY (TUBING) ×2 IMPLANT
CLOTH BEACON ORANGE TIMEOUT ST (SAFETY) ×2 IMPLANT
COVER SURGICAL LIGHT HANDLE (MISCELLANEOUS) ×1 IMPLANT
DRAPE C-ARM 42X72 X-RAY (DRAPES) ×2 IMPLANT
DRAPE CAMERA CLOSED 9X96 (DRAPES) ×2 IMPLANT
DRAPE LINGEMAN PERC (DRAPES) ×2 IMPLANT
DRAPE SURG IRRIG POUCH 19X23 (DRAPES) ×2 IMPLANT
DRSG PAD ABDOMINAL 8X10 ST (GAUZE/BANDAGES/DRESSINGS) ×1 IMPLANT
DRSG TEGADERM 8X12 (GAUZE/BANDAGES/DRESSINGS) ×4 IMPLANT
GLOVE BIOGEL M 8.0 STRL (GLOVE) ×7 IMPLANT
GOWN STRL REIN XL XLG (GOWN DISPOSABLE) ×3 IMPLANT
KIT BASIN OR (CUSTOM PROCEDURE TRAY) ×2 IMPLANT
LASER FIBER DISP (UROLOGICAL SUPPLIES) IMPLANT
LASER FIBER DISP 1000U (UROLOGICAL SUPPLIES) IMPLANT
MANIFOLD NEPTUNE II (INSTRUMENTS) ×2 IMPLANT
NS IRRIG 1000ML POUR BTL (IV SOLUTION) ×2 IMPLANT
PACK BASIC VI WITH GOWN DISP (CUSTOM PROCEDURE TRAY) ×2 IMPLANT
PAD ABD 7.5X8 STRL (GAUZE/BANDAGES/DRESSINGS) ×4 IMPLANT
PROBE LITHOCLAST ULTRA 3.8X403 (UROLOGICAL SUPPLIES) IMPLANT
PROBE PNEUMATIC 1.0MMX570MM (UROLOGICAL SUPPLIES) ×1 IMPLANT
SET IRRIG Y TYPE TUR BLADDER L (SET/KITS/TRAYS/PACK) ×1 IMPLANT
SET WARMING FLUID IRRIGATION (MISCELLANEOUS) ×2 IMPLANT
SPONGE GAUZE 4X4 12PLY (GAUZE/BANDAGES/DRESSINGS) ×1 IMPLANT
SPONGE LAP 4X18 X RAY DECT (DISPOSABLE) ×2 IMPLANT
STONE CATCHER W/TUBE ADAPTER (UROLOGICAL SUPPLIES) IMPLANT
SUT SILK 2 0 30  PSL (SUTURE) ×1
SUT SILK 2 0 30 PSL (SUTURE) ×1 IMPLANT
SYR 20CC LL (SYRINGE) ×4 IMPLANT
SYRINGE 10CC LL (SYRINGE) ×2 IMPLANT
TRAY FOLEY BAG SILVER LF 14FR (CATHETERS) ×1 IMPLANT
TRAY FOLEY CATH 14FRSI W/METER (CATHETERS) ×2 IMPLANT
TUBING CONNECTING 10 (TUBING) ×4 IMPLANT
WATER STERILE IRR 1500ML POUR (IV SOLUTION) ×1 IMPLANT

## 2011-12-06 NOTE — Anesthesia Postprocedure Evaluation (Signed)
  Anesthesia Post-op Note  Patient: Kristi Perry  Procedure(s) Performed: Procedure(s) (LRB): NEPHROLITHOTOMY PERCUTANEOUS (Right)  Patient Location: PACU  Anesthesia Type: General  Level of Consciousness: awake and alert   Airway and Oxygen Therapy: Patient Spontanous Breathing  Post-op Pain: mild  Post-op Assessment: Post-op Vital signs reviewed, Patient's Cardiovascular Status Stable, Respiratory Function Stable, Patent Airway and No signs of Nausea or vomiting  Post-op Vital Signs: stable  Complications: No apparent anesthesia complications

## 2011-12-06 NOTE — Anesthesia Preprocedure Evaluation (Addendum)
Anesthesia Evaluation  Patient identified by MRN, date of birth, ID band Patient awake    Reviewed: Allergy & Precautions, H&P , NPO status , Patient's Chart, lab work & pertinent test results  Airway Mallampati: II TM Distance: >3 FB Neck ROM: Full    Dental No notable dental hx.    Pulmonary pneumonia -, COPD oxygen dependent,  Recent pneumonia per patient. Wears oxygen frequently at home per patient. breath sounds clear to auscultation  Pulmonary exam normal       Cardiovascular hypertension, Rhythm:Regular Rate:Normal     Neuro/Psych  Headaches, Seizures -, Poorly Controlled,  PSYCHIATRIC DISORDERS Anxiety Depression Daily seizures per patient.  Neuromuscular disease negative psych ROS   GI/Hepatic Neg liver ROS, hiatal hernia,   Endo/Other  Hypothyroidism   Renal/GU negative Renal ROS  negative genitourinary   Musculoskeletal negative musculoskeletal ROS (+)   Abdominal   Peds negative pediatric ROS (+)  Hematology negative hematology ROS (+)   Anesthesia Other Findings   Reproductive/Obstetrics negative OB ROS                          Anesthesia Physical Anesthesia Plan  ASA: III  Anesthesia Plan: General   Post-op Pain Management:    Induction: Intravenous  Airway Management Planned: Oral ETT  Additional Equipment:   Intra-op Plan:   Post-operative Plan: Extubation in OR  Informed Consent: I have reviewed the patients History and Physical, chart, labs and discussed the procedure including the risks, benefits and alternatives for the proposed anesthesia with the patient or authorized representative who has indicated his/her understanding and acceptance.   Dental advisory given  Plan Discussed with: CRNA  Anesthesia Plan Comments:         Anesthesia Quick Evaluation

## 2011-12-06 NOTE — Interval H&P Note (Signed)
History and Physical Interval Note:  12/06/2011 2:57 PM  Kristi Perry  has presented today for surgery, with the diagnosis of right renal calculi  The various methods of treatment have been discussed with the patient and family. After consideration of risks, benefits and other options for treatment, the patient has consented to  Procedure(s) (LRB) with comments: NEPHROLITHOTOMY PERCUTANEOUS (Right) -  REPEAT PCNL  as a surgical intervention .  The patient's history has been reviewed, patient examined, no change in status, stable for surgery.  I have reviewed the patient's chart and labs.  Questions were answered to the patient's satisfaction.     Chelsea Aus

## 2011-12-06 NOTE — Op Note (Signed)
Preoperative diagnosis: Persistent right renal calculi  Postoperative diagnosis: Same   Procedure: Right percutaneous nephrolithotomy, second look    Surgeon: Bertram Millard. Javier Gell, M.D.   Anesthesia: Gen  Complications: None  Specimen(s): Stones, the patient  Drain(s): 18 French Foley catheter, indwelling right double-J stent     Technique and findings: The patient was properly identified and marked in the holding area. She was taken to the operating room where general anesthetic was administered. She was then placed in the prone position, all extremities were padded appropriately. Her right flank was then prepped and draped. The nephrostomy tube was transected, and a guidewire was placed through the tube, into the right renal pelvis, the nephrostomy tube was removed. I then used a Kumpe catheter to guide the guidewire into the ureter, and negotiated into the bladder. The Kumpe catheter was then removed, and placed the peel-away ureteral access sheath. The core was removed and a second guidewire placed through this access sheath into the bladder using fluoroscopic guidance. The access sheath was then removed. I then dilated the patient's nephrostomy tract to 15 mm using a tract master balloon. I then placed a nephrostomy access sheath over top of this area this afforded [place] entry into the renal pelvis. The balloon was then removed. I then placed the flexible cystoscope through the nephrostomy sheath, and inspected the renal pelvis. Small fragments were removed around the stent which was seen at the UPJ. I negotiated the cystoscope into all of the available calyces. Multiple fragments were removed, using the Nitinol basket. Tiny fragments were unable to the snared with the basket due to the small size of these. All large fragments which were identified were properly removed. Following this, all calyces were again inspected. The renal pelvis was inspected as well, and with no significant stone seen, I  then removed the cystoscope. With the stent in place, I felt that I could removed the nephrostomy tube. The access sheath was removed. The guidewires were removed under fluoroscopic guidance. I then closed the nephrostomy site with 2 separate sutures of 2-0 silk, he is placed in a vertical mattress fashion. Dry sterile dressing was placed. The patient was then placed in the supine position, an 34 French Foley catheter was changed with a new one, and hooked to dependent drainage. The patient was then awakened and taken to PACU in stable condition.

## 2011-12-06 NOTE — Transfer of Care (Signed)
Immediate Anesthesia Transfer of Care Note  Patient: Kristi Perry  Procedure(s) Performed: Procedure(s) (LRB) with comments: NEPHROLITHOTOMY PERCUTANEOUS (Right) -  REPEAT PCNL   Patient Location: PACU  Anesthesia Type: General  Level of Consciousness: awake, alert  and patient cooperative  Airway & Oxygen Therapy: Patient Spontanous Breathing and Patient connected to face mask oxygen  Post-op Assessment: Report given to PACU RN and Post -op Vital signs reviewed and stable  Post vital signs: Reviewed and stable  Complications: No apparent anesthesia complications

## 2011-12-06 NOTE — Preoperative (Signed)
Beta Blockers   Reason not to administer Beta Blockers:Not Applicable 

## 2011-12-06 NOTE — H&P (Signed)
H&P  Chief Complaint: kidney stones  History of Present Illness: Kristi Perry is a 46 y.o. year old visit complicated history recently with a large stone burden in the right kidney. She is here today for a third procedure to try to render her stone free.   This woman underwent right percutaneous nephrolithotomy on 10/07/2011. It was a very difficult procedure, her kidney was quite friable, and I decided that any second procedure would be done ureteroscopically. The second procedure was performed on 11/07/2011. She had a significant stone burden remaining, and had these areas fragmented thoroughly with laser. Quite a few fragments were removed ureteroscopically. She has a nephrostomy catheter in, as well as a Foley catheter. Since her procedure was done, and she was discharged, she has had no significant pain or fever. At the time of her second procedure done ureteroscopically, is seen at the renal pelvis was back to normal, and at this point, with stone burden remaining, I will attempt a third and, hopefully, final procedure to remove her stone burden.  She was seen last week with some changes around her nephrostomy tube, which were treated with antibiotics.    Past Medical History  Diagnosis Date  . Sepsis   . Hypertension   . DVT (deep vein thrombosis) in pregnancy   . Depression   . Chronic pain syndrome   . Allergic rhinitis   . Acute respiratory failure   . Epilepsy   . Migraine   . Chronic airway obstruction   . Cerebral palsy   . Dysphagia   . Hiatal hernia   . Morbid obesity 09-25-11    requires Hoyer lift. pt. doesn't stand or ambulate.  . Incontinent of urine 09-25-11    hx.  . Incontinent of feces 09-25-11    hx.  . Vitamin d deficiency   . Sepsis     hx of   . Hyperlipidemia   . Mild intellectual disabilities   . Neurogenic bladder   . Hypothyroidism   . Anxiety   . Unspecified psychosis   . Muscle weakness   . Dysphasia   . Pneumonia     hx of asp pneumonia  1/11-1/19/12  . Sacral decubitus ulcer     hx of    Past Surgical History  Procedure Date  . Nephrolithotomy 10/07/2011    Procedure: NEPHROLITHOTOMY PERCUTANEOUS;  Surgeon: Marcine Matar, MD;  Location: [facility];  Service: Urology;  Laterality: Right;  kidney   . Cystoscopy with ureteroscopy 11/07/2011    Procedure: CYSTOSCOPY WITH URETEROSCOPY;  Surgeon: Marcine Matar, MD;  Location: [facility];  Service: Urology;  Laterality: Right;  Removal of right double J stent, Insertion right double J stent  . Stone extraction with basket 11/07/2011    Procedure: STONE EXTRACTION WITH BASKET;  Surgeon: Marcine Matar, MD;  Location: [facility];  Service: Urology;  Laterality: Right;    Home Medications:  No prescriptions prior to admission    Allergies:  Allergies  Allergen Reactions  . Sulfa Antibiotics Hives  . Tape Rash    Paper tape    History reviewed. No pertinent family history.  Social History:  reports that she has never smoked. She does not have any smokeless tobacco history on file. She reports that she does not drink alcohol or use illicit drugs.  ROS: A complete review of systems was performed.  All systems are negative except for pertinent findings as noted.  Physical Exam:  Vital signs in last 24 hours: Weight:  [95.709  kg (211 lb)] 95.709 kg (211 lb) (10/03 1800) General:  Alert and oriented, No acute distress HEENT: Normocephalic, atraumatic Neck: No JVD or lymphadenopathy Cardiovascular: Regular rate and rhythm Lungs: Clear bilaterally Abdomen: Soft, nontender, nondistended, no abdominal masses Back: No CVA tenderness Extremities: No edema Neurologic: Grossly intact  Laboratory Data:  No results found for this or any previous visit (from the past 24 hour(s)). No results found for this or any previous visit (from the past 240 hour(s)). Creatinine:  Basename 12/03/11 1913  CREATININE 0.49*    Radiologic Imaging: No results found.  Impression/Assessment:    Persistent right renal calculi  Plan:  Second look for cutaneous nephrolithotomy  Chelsea Aus 12/06/2011, 6:42 AM  Bertram Millard. Mirca Yale MD

## 2011-12-07 MED ORDER — INFLUENZA VIRUS VACC SPLIT PF IM SUSP
0.5000 mL | Freq: Once | INTRAMUSCULAR | Status: AC
  Administered 2011-12-07: 0.5 mL via INTRAMUSCULAR
  Filled 2011-12-07: qty 0.5

## 2011-12-07 NOTE — Progress Notes (Signed)
Pt to be d/c today to [facility].   Pt and family agreeable. Confirmed plans with facility.  Plan transfer via EMS.   Leron Croak, LCSWA [employer] [phone]
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
## 2011-12-07 NOTE — Progress Notes (Signed)
Report called to [facility]. Patient left via EMS in stable condition. Erskin Burnet RN

## 2011-12-07 NOTE — Progress Notes (Signed)
UR completed 

## 2011-12-07 NOTE — Progress Notes (Signed)
1 Day Post-Op Subjective: Patient reports pain control good. Her primary complaint is the irritation caused by the Foley catheter.  Objective: Vital signs in last 24 hours: Temp:  [97 F (36.1 C)-98.8 F (37.1 C)] 98 F (36.7 C) (10/04 2200) Pulse Rate:  [82-109] 100  (10/04 2200) Resp:  [11-20] 20  (10/04 2200) BP: (126-148)/(64-84) 136/78 mmHg (10/04 2200) SpO2:  [95 %-100 %] 97 % (10/05 1036)  Intake/Output from previous day: 10/04 0701 - 10/05 0700 In: 2130 [I.V.:1930; IV Piggyback:200] Out: 1100 [Urine:1000; Blood:100] Intake/Output this shift:    Physical Exam:  General: She appears alert and oriented. Abdomen: Soft and nontender.  Lab Results: No results found for this basename: HGB:3,HCT:3 in the last 72 hours BMET No results found for this basename: NA:2,K:2,CL:2,CO2:2,GLUCOSE:2,BUN:2,CREATININE:2,CALCIUM:2 in the last 72 hours No results found for this basename: LABPT:3,INR:3 in the last 72 hours No results found for this basename: LABURIN:1 in the last 72 hours Results for orders placed during the hospital encounter of 12/06/11  SURGICAL PCR SCREEN     Status: Abnormal   Collection Time   12/06/11 12:14 PM      Component Value Range Status Comment   MRSA, PCR POSITIVE (*) NEGATIVE Final    Staphylococcus aureus POSITIVE (*) NEGATIVE Final     Studies/Results: Dg C-arm 61-120 Min-no Report  12/06/2011  CLINICAL DATA: percutaneous   C-ARM 61-120 MINUTES  Fluoroscopy was utilized by the requesting physician.  No radiographic  interpretation.      Assessment/Plan: She seems to be doing well. At this point she has a stent in and has had her nephrostomy tube removed yesterday. Although she is bothered by the catheter due to the irritation her urine is clear and I have informed her that the catheter was left in place due to the presence of a stent and a recent nephrostomy tube in order to prevent urine from leaking out of the kidney and into the flank or through her  nephrostomy tract. She otherwise is afebrile and appears to be doing well and ready for discharge at this time.  She will be discharged to her nursing home with a Foley catheter indwelling.   LOS: 1 day   Placido Hangartner C 12/07/2011, 11:19 AM

## 2011-12-07 NOTE — Discharge Summary (Signed)
Physician Discharge Summary  Patient ID: Kristi Perry MRN: [id number] DOB/AGE: Jun 07, 1965 46 y.o.  Admit date: 12/06/2011 Discharge date: 12/07/2011  Admission Diagnoses: Renal calculi  Discharge Diagnoses: Same as admission Active Problems:  * No active hospital problems. *    Discharged Condition: good  Hospital Course: See admission H&P, copies of progress notes and operative summary.   Discharge Exam: Blood pressure 104/63, pulse 117, temperature 98.5 F (36.9 C), temperature source Oral, resp. rate 20, height 5\' 2"  (1.575 m), weight 95.709 kg (211 lb), SpO2 95.00%. Unchanged from admission  Disposition: 06-Home-Health Care Svc     Medication List     As of 12/07/2011  2:48 PM    TAKE these medications         acetaminophen 500 MG tablet   Commonly known as: TYLENOL   Take 1,000 mg by mouth 2 (two) times daily.      albuterol 108 (90 BASE) MCG/ACT inhaler   Commonly known as: PROVENTIL HFA;VENTOLIN HFA   Inhale 2 puffs into the lungs every 4 (four) hours as needed. For wheezing or shortness of breath      albuterol 108 (90 BASE) MCG/ACT inhaler   Commonly known as: PROVENTIL HFA;VENTOLIN HFA   Inhale 1-2 puffs into the lungs every 6 (six) hours as needed for wheezing.      aspirin EC 81 MG tablet   Take 81 mg by mouth daily.      baclofen 10 MG tablet   Commonly known as: LIORESAL   Take 10 mg by mouth 4 (four) times daily.      budesonide-formoterol 160-4.5 MCG/ACT inhaler   Commonly known as: SYMBICORT   Inhale 1 puff into the lungs 2 (two) times daily.      CERTA-VITE PO   Take 1 tablet by mouth daily.      citalopram 40 MG tablet   Commonly known as: CELEXA   Take 40 mg by mouth at bedtime.      clonazePAM 1 MG tablet   Commonly known as: KLONOPIN   Take 1 mg by mouth 2 (two) times daily.      fexofenadine-pseudoephedrine 180-240 MG per 24 hr tablet   Commonly known as: ALLEGRA-D 24   Take 1 tablet by mouth every morning.      fluticasone  50 MCG/ACT nasal spray   Commonly known as: FLONASE   Place 2 sprays into the nose every morning.      HYDROcodone-acetaminophen 10-325 MG per tablet   Commonly known as: NORCO   Take 1 tablet by mouth at bedtime as needed. For pain      ketotifen 0.025 % ophthalmic solution   Commonly known as: ZADITOR   Place 1 drop into both eyes 2 (two) times daily.      lactulose 10 GM/15ML solution   Commonly known as: CHRONULAC   Take 20 g by mouth at bedtime as needed. For constipation      lamoTRIgine 100 MG tablet   Commonly known as: LAMICTAL   Take 100 mg by mouth 2 (two) times daily.      levETIRAcetam 250 MG tablet   Commonly known as: KEPPRA   Take 250 mg by mouth every 12 (twelve) hours.      levofloxacin 750 MG tablet   Commonly known as: LEVAQUIN   Take 1 tablet (750 mg total) by mouth daily. X 7 days      levothyroxine 25 MCG tablet   Commonly known as: SYNTHROID, LEVOTHROID  Take 25 mcg by mouth daily before breakfast.      LORazepam 1 MG tablet   Commonly known as: ATIVAN   Take 1 mg by mouth every 4 (four) hours as needed. For agitation      montelukast 10 MG tablet   Commonly known as: SINGULAIR   Take 10 mg by mouth every morning.      mupirocin ointment 2 %   Commonly known as: BACTROBAN   Apply topically 2 (two) times daily.      nitroGLYCERIN 0.4 MG SL tablet   Commonly known as: NITROSTAT   Place 0.4 mg under the tongue every 5 (five) minutes x 3 doses as needed. For chest pain      omeprazole 20 MG capsule   Commonly known as: PRILOSEC   Take 20 mg by mouth every morning.      oxyCODONE 5 MG immediate release tablet   Commonly known as: Oxy IR/ROXICODONE   Take 5 mg by mouth 2 (two) times daily. Hold for sedation      pravastatin 40 MG tablet   Commonly known as: PRAVACHOL   Take 40 mg by mouth every evening.      risperiDONE 2 MG tablet   Commonly known as: RISPERDAL   Take 2 mg by mouth 2 (two) times daily.      risperiDONE microspheres  25 MG injection   Commonly known as: RISPERDAL CONSTA   Inject 25 mg into the muscle every 14 (fourteen) days.      sodium chloride 0.65 % nasal spray   Commonly known as: OCEAN   Place 1 spray into the nose 2 (two) times daily.      venlafaxine XR 150 MG 24 hr capsule   Commonly known as: EFFEXOR-XR   Take 150 mg by mouth daily with breakfast.      vitamin C 500 MG tablet   Commonly known as: ASCORBIC ACID   Take 500 mg by mouth 2 (two) times daily.      Vitamin D 400 UNITS capsule   Take 400 Units by mouth every morning.       The patient is felt to be stable and improved. She is now stone free. She will be discharged back to her nursing home with a Foley catheter. Followup and patient structions as noted in the hospital chart.  SignedGarnett Farm 12/07/2011, 2:48 PM

## 2011-12-09 ENCOUNTER — Encounter (HOSPITAL_COMMUNITY): Payer: Self-pay | Admitting: Urology

## 2011-12-09 LAB — URINE CULTURE: Colony Count: >=100000

## 2011-12-12 NOTE — ED Provider Notes (Signed)
Medical screening examination/treatment/procedure(s) were performed by non-physician practitioner and as supervising physician I was immediately available for consultation/collaboration.   Carleene Cooper III, MD 12/12/11 2102

## 2011-12-19 ENCOUNTER — Emergency Department (HOSPITAL_COMMUNITY)
Admission: EM | Admit: 2011-12-19 | Discharge: 2011-12-20 | Disposition: A | Discharge status: Skilled Nursing Facility | Payer: Medicare Other | Attending: Emergency Medicine | Admitting: Emergency Medicine

## 2011-12-19 ENCOUNTER — Encounter (HOSPITAL_COMMUNITY): Payer: Self-pay | Admitting: *Deleted

## 2011-12-19 ENCOUNTER — Emergency Department (HOSPITAL_COMMUNITY): Payer: Medicare Other

## 2011-12-19 DIAGNOSIS — R0602 Shortness of breath: Secondary | ICD-10-CM | POA: Insufficient documentation

## 2011-12-19 DIAGNOSIS — R5381 Other malaise: Secondary | ICD-10-CM | POA: Insufficient documentation

## 2011-12-19 DIAGNOSIS — R062 Wheezing: Secondary | ICD-10-CM | POA: Insufficient documentation

## 2011-12-19 DIAGNOSIS — J209 Acute bronchitis, unspecified: Secondary | ICD-10-CM | POA: Insufficient documentation

## 2011-12-19 DIAGNOSIS — Z79899 Other long term (current) drug therapy: Secondary | ICD-10-CM | POA: Insufficient documentation

## 2011-12-19 DIAGNOSIS — G809 Cerebral palsy, unspecified: Secondary | ICD-10-CM | POA: Insufficient documentation

## 2011-12-19 LAB — URINE MICROSCOPIC-ADD ON

## 2011-12-19 LAB — CBC WITH DIFFERENTIAL/PLATELET
Basophils Absolute: 0 10*3/uL (ref 0.0–0.1)
Eosinophils Absolute: 0.2 10*3/uL (ref 0.0–0.7)
Eosinophils Relative: 3 % (ref 0–5)
MCH: 27.8 pg (ref 26.0–34.0)
MCV: 88.1 fL (ref 78.0–100.0)
Platelets: 159 10*3/uL (ref 150–400)
RDW: 14.6 % (ref 11.5–15.5)
WBC: 6.8 10*3/uL (ref 4.0–10.5)

## 2011-12-19 LAB — COMPREHENSIVE METABOLIC PANEL
ALT: 25 U/L (ref 0–35)
AST: 20 U/L (ref 0–37)
Calcium: 9.4 mg/dL (ref 8.4–10.5)
GFR calc Af Amer: >90 mL/min (ref >90)
Sodium: 136 mEq/L (ref 135–145)
Total Protein: 6.5 g/dL (ref 6.0–8.3)

## 2011-12-19 LAB — URINALYSIS, ROUTINE W REFLEX MICROSCOPIC
Glucose, UA: NEGATIVE mg/dL
Specific Gravity, Urine: 1.02 (ref 1.005–1.030)
Urobilinogen, UA: 0.2 mg/dL (ref 0.0–1.0)

## 2011-12-19 LAB — PREGNANCY, URINE: Preg Test, Ur: NEGATIVE

## 2011-12-19 MED ORDER — ONDANSETRON HCL 4 MG/2ML IJ SOLN
4.0000 mg | Freq: Once | INTRAMUSCULAR | Status: AC
  Administered 2011-12-19: 4 mg via INTRAVENOUS
  Filled 2011-12-19: qty 2

## 2011-12-19 MED ORDER — ALBUTEROL SULFATE (5 MG/ML) 0.5% IN NEBU
5.0000 mg | INHALATION_SOLUTION | Freq: Once | RESPIRATORY_TRACT | Status: AC
  Administered 2011-12-19: 5 mg via RESPIRATORY_TRACT
  Filled 2011-12-19: qty 1

## 2011-12-19 MED ORDER — SODIUM CHLORIDE 0.9 % IV BOLUS (SEPSIS)
1000.0000 mL | Freq: Once | INTRAVENOUS | Status: AC
  Administered 2011-12-19: 1000 mL via INTRAVENOUS

## 2011-12-19 MED ORDER — OXYCODONE-ACETAMINOPHEN 5-325 MG PO TABS
2.0000 | ORAL_TABLET | Freq: Once | ORAL | Status: AC
  Administered 2011-12-19: 2 via ORAL
  Filled 2011-12-19: qty 2

## 2011-12-19 MED ORDER — IPRATROPIUM BROMIDE 0.02 % IN SOLN
0.5000 mg | Freq: Once | RESPIRATORY_TRACT | Status: AC
  Administered 2011-12-19: 0.5 mg via RESPIRATORY_TRACT
  Filled 2011-12-19: qty 2.5

## 2011-12-19 MED ORDER — SODIUM CHLORIDE 0.9 % IV SOLN: INTRAVENOUS | Status: DC

## 2011-12-19 MED ORDER — ALBUTEROL SULFATE HFA 108 (90 BASE) MCG/ACT IN AERS: 2.0000 | INHALATION_SPRAY | RESPIRATORY_TRACT | Status: DC | PRN

## 2011-12-19 MED ORDER — PREDNISONE 20 MG PO TABS: ORAL_TABLET | ORAL | Status: DC

## 2011-12-19 MED ORDER — PREDNISONE 20 MG PO TABS
60.0000 mg | ORAL_TABLET | Freq: Once | ORAL | Status: AC
  Administered 2011-12-19: 60 mg via ORAL
  Filled 2011-12-19: qty 3

## 2011-12-19 NOTE — ED Notes (Signed)
[id number]<NG> Expected date:<BR> Expected time: 6:42 PM<BR> Means of arrival:<BR> Comments:<BR> Flu-like symptoms

## 2011-12-19 NOTE — ED Notes (Signed)
Per ems: pt from [facility], c/o "flu-like symptoms", nausea, increased SOB, productive cough (white/clear) and chest pain with deep breathing since Monday 10/14. Hx of pneumonia, states she is experiencing similar symptoms to pna. Hx of cerebral palsy. Uses oxygen 3L [place] at home. bp 128/83, pulse 102, respirations 24, saO2 99% on 3L

## 2011-12-19 NOTE — ED Notes (Signed)
Attempted to call report to [facility], no RN able to take report. RN to call back to ER for report. [facility] called to transport pt back to [facility]

## 2011-12-19 NOTE — ED Provider Notes (Signed)
History     CSN: [id number]  Arrival date & time 12/19/11  1844   First MD Initiated Contact with Patient 12/19/11 1908      Chief Complaint  Patient presents with  . Shortness of Breath  . Cough    (Consider location/radiation/quality/duration/timing/severity/associated sxs/prior treatment) HPI Several days cough, SOB, N/V/D, constant CP, intermittent diffuse abd pain, questionable PNA on CXR, on Levaquin last ED visit, Pt states not better so back to ED, already on home O2. In NH at baseline from CP, doesn't like her NH but addressed by SW last ED visit, Pt has been in 14 or more SNFs already. Past Medical History  Diagnosis Date  . Sepsis(995.91)   . Hypertension   . DVT (deep vein thrombosis) in pregnancy   . Depression   . Chronic pain syndrome   . Allergic rhinitis   . Acute respiratory failure   . Epilepsy   . Migraine   . Chronic airway obstruction   . Cerebral palsy   . Dysphagia   . Hiatal hernia   . Morbid obesity 09-25-11    requires Hoyer lift. pt. doesn't stand or ambulate.  . Incontinent of urine 09-25-11    hx.  . Incontinent of feces 09-25-11    hx.  . Vitamin D deficiency   . Sepsis(995.91)     hx of   . Hyperlipidemia   . Mild intellectual disabilities   . Neurogenic bladder   . Hypothyroidism   . Anxiety   . Unspecified psychosis   . Muscle weakness   . Dysphasia   . Pneumonia     hx of asp pneumonia 1/11-1/19/12  . Sacral decubitus ulcer     hx of    Past Surgical History  Procedure Date  . Nephrolithotomy 10/07/2011    Procedure: NEPHROLITHOTOMY PERCUTANEOUS;  Surgeon: Marcine Matar, MD;  Location: [facility];  Service: Urology;  Laterality: Right;  kidney   . Cystoscopy with ureteroscopy 11/07/2011    Procedure: CYSTOSCOPY WITH URETEROSCOPY;  Surgeon: Marcine Matar, MD;  Location: [facility];  Service: Urology;  Laterality: Right;  Removal of right double J stent, Insertion right double J stent  . Stone extraction with basket 11/07/2011    Procedure: STONE EXTRACTION WITH BASKET;  Surgeon: Marcine Matar, MD;  Location: [facility];  Service: Urology;  Laterality: Right;  . Nephrolithotomy 12/06/2011    Procedure: NEPHROLITHOTOMY PERCUTANEOUS;  Surgeon: Marcine Matar, MD;  Location: [facility];  Service: Urology;  Laterality: Right;   REPEAT PCNL     No family history on file.  History  Substance Use Topics  . Smoking status: Never Smoker   . Smokeless tobacco: Not on file  . Alcohol Use: No    OB History    Grav Para Term Preterm Abortions TAB SAB Ect Mult Living                  Review of Systems 10 Systems reviewed and are negative for acute change except as noted in the HPI. Allergies  Sulfa antibiotics and Tape  Home Medications   Current Outpatient Rx  Name Route Sig Dispense Refill  . ACETAMINOPHEN 500 MG PO TABS Oral Take 1,000 mg by mouth 2 (two) times daily.    . ALBUTEROL SULFATE HFA 108 (90 BASE) MCG/ACT IN AERS Inhalation Inhale 1-2 puffs into the lungs every 6 (six) hours as needed for wheezing. 1 Inhaler 0  . ALBUTEROL SULFATE HFA 108 (90 BASE) MCG/ACT IN AERS Inhalation Inhale 2 puffs  into the lungs every 2 (two) hours as needed for wheezing or shortness of breath (cough). 1 Inhaler 0  . ASPIRIN EC 81 MG PO TBEC Oral Take 81 mg by mouth daily.    Marland Kitchen BACLOFEN 10 MG PO TABS Oral Take 10 mg by mouth 4 (four) times daily.     . BUDESONIDE-FORMOTEROL FUMARATE 160-4.5 MCG/ACT IN AERO Inhalation Inhale 1 puff into the lungs 2 (two) times daily.    Marland Kitchen VITAMIN D 400 UNITS PO CAPS Oral Take 400 Units by mouth every morning.     Marland Kitchen CITALOPRAM HYDROBROMIDE 40 MG PO TABS Oral Take 40 mg by mouth at bedtime.    Marland Kitchen CLONAZEPAM 1 MG PO TABS Oral Take 1 mg by mouth 2 (two) times daily.     Marland Kitchen FEXOFENADINE-PSEUDOEPHED ER 180-240 MG PO TB24 Oral Take 1 tablet by mouth every morning.    Marland Kitchen FLUTICASONE PROPIONATE 50 MCG/ACT NA SUSP Nasal Place 2 sprays into the nose every morning.     Marland Kitchen HYDROCODONE-ACETAMINOPHEN 10-325 MG  PO TABS Oral Take 1 tablet by mouth at bedtime as needed. For pain    . KETOTIFEN FUMARATE 0.025 % OP SOLN Both Eyes Place 1 drop into both eyes 2 (two) times daily.    Marland Kitchen LACTULOSE 10 GM/15ML PO SOLN Oral Take 20 g by mouth at bedtime as needed. For constipation    . LAMOTRIGINE 100 MG PO TABS Oral Take 100 mg by mouth 2 (two) times daily.     Marland Kitchen LEVETIRACETAM 250 MG PO TABS Oral Take 250 mg by mouth every 12 (twelve) hours.    Marland Kitchen LEVOFLOXACIN 750 MG PO TABS Oral Take 1 tablet (750 mg total) by mouth daily. X 7 days 6 tablet 0  . LEVOTHYROXINE SODIUM 25 MCG PO TABS Oral Take 25 mcg by mouth daily before breakfast.     . LORAZEPAM 1 MG PO TABS Oral Take 1 mg by mouth every 4 (four) hours as needed. For agitation    . MONTELUKAST SODIUM 10 MG PO TABS Oral Take 10 mg by mouth every morning.     . CERTA-VITE PO Oral Take 1 tablet by mouth daily.    Marland Kitchen MUPIROCIN 2 % EX OINT Topical Apply topically 2 (two) times daily. 22 g 0  . NITROGLYCERIN 0.4 MG SL SUBL Sublingual Place 0.4 mg under the tongue every 5 (five) minutes x 3 doses as needed. For chest pain    . OMEPRAZOLE 20 MG PO CPDR Oral Take 20 mg by mouth every morning.     . OXYCODONE HCL 5 MG PO TABS Oral Take 5 mg by mouth 2 (two) times daily. Hold for sedation    . PRAVASTATIN SODIUM 40 MG PO TABS Oral Take 40 mg by mouth every evening.     Marland Kitchen PREDNISONE 20 MG PO TABS  2 tabs po daily x 4 days 8 tablet 0  . RISPERIDONE 2 MG PO TABS Oral Take 2 mg by mouth 2 (two) times daily.    Marland Kitchen RISPERIDONE MICROSPHERES 25 MG IM SUSR Intramuscular Inject 25 mg into the muscle every 14 (fourteen) days.    . SODIUM CHLORIDE 0.65 % NA SOLN Nasal Place 1 spray into the nose 2 (two) times daily.    . VENLAFAXINE HCL ER 150 MG PO CP24 Oral Take 150 mg by mouth daily with breakfast.    . VITAMIN C 500 MG PO TABS Oral Take 500 mg by mouth 2 (two) times daily.  BP 119/64  Pulse 100  Temp 98.1 F (36.7 C) (Oral)  Resp 18  SpO2 98%  LMP 12/16/2011  Physical  Exam  Nursing note and vitals reviewed. Constitutional:       Awake, alert, nontoxic appearance.Obese.  HENT:  Head: Atraumatic.  Eyes: Right eye exhibits no discharge. Left eye exhibits no discharge.  Neck: Neck supple.  Cardiovascular: Normal rate and regular rhythm.   No murmur heard. Pulmonary/Chest: Effort normal. No respiratory distress. She has wheezes. She has no rales. She exhibits tenderness.       Few scattered wheezes/rhonchi; speaks sentences, pulse ox normal 100% on n/c O2; diffuse anterior chest wall tenderness  Abdominal: Soft. Bowel sounds are normal. She exhibits no mass. There is tenderness. There is no rebound and no guarding.       Minimal diffuse tenderness  Musculoskeletal: She exhibits no tenderness.       Baseline ROM, no obvious new focal weakness.  Neurological: She is alert.       Mental status and motor strength appears baseline for patient and situation.  Skin: No rash noted.  Psychiatric: She has a normal mood and affect.    ED Course  Procedures (including critical care time) Suspect contaminated U/A. L:CTA on recheck. Labs Reviewed  CBC WITH DIFFERENTIAL - Abnormal; Notable for the following:    Hemoglobin 11.9 (*)     All other components within normal limits  URINALYSIS, ROUTINE W REFLEX MICROSCOPIC - Abnormal; Notable for the following:    Color, Urine RED (*)  BIOCHEMICALS MAY BE AFFECTED BY COLOR   APPearance TURBID (*)     Hgb urine dipstick LARGE (*)     Protein, ur 30 (*)     Leukocytes, UA MODERATE (*)     All other components within normal limits  URINE MICROSCOPIC-ADD ON - Abnormal; Notable for the following:    Squamous Epithelial / LPF MANY (*)     Bacteria, UA MANY (*)     All other components within normal limits  COMPREHENSIVE METABOLIC PANEL  LIPASE, BLOOD  PREGNANCY, URINE  LAB REPORT - SCANNED   Dg Chest 2 View  12/19/2011  *RADIOLOGY REPORT*  Clinical Data: Shortness of breath, weakness and cough.  CHEST - 2 VIEW   Comparison: Chest x-ray 12/03/2011.  Findings: Lung volumes are low.  No consolidative airspace disease. No definite pleural effusions.  No evidence of pulmonary edema. Heart size is borderline enlarged. The patient is rotated to the right on today's exam, resulting in distortion of the mediastinal contours and reduced diagnostic sensitivity and specificity for mediastinal pathology.  IMPRESSION: 1. No radiographic evidence of acute cardiopulmonary disease.   Original Report Authenticated By: Florencia Reasons, M.D.      1. Bronchospasm with bronchitis, acute       MDM  Pt stable in ED with no significant deterioration in condition.Patient / Family / Caregiver informed of clinical course, understand medical decision-making process, and agree with plan.         Hurman Horn, MD 12/21/11 [phone]

## 2011-12-24 NOTE — Discharge Summary (Signed)
Patient ID: Kristi Perry MRN: [id number] DOB/AGE: 05/20/1965 46 y.o.  Admit date: 11/07/2011 Discharge date: 12/24/2011  Primary Care Physician:  Karlene Einstein, MD  Discharge Diagnoses:   Right renal calculi  Consults:  none   Discharge Medications:   Medication List     As of 12/24/2011  6:03 AM    TAKE these medications         acetaminophen 500 MG tablet   Commonly known as: TYLENOL   Take 1,000 mg by mouth 2 (two) times daily.      baclofen 10 MG tablet   Commonly known as: LIORESAL   Take 10 mg by mouth 4 (four) times daily.      budesonide-formoterol 160-4.5 MCG/ACT inhaler   Commonly known as: SYMBICORT   Inhale 1 puff into the lungs 2 (two) times daily.      citalopram 40 MG tablet   Commonly known as: CELEXA   Take 40 mg by mouth at bedtime.      clonazePAM 1 MG tablet   Commonly known as: KLONOPIN   Take 1 mg by mouth 2 (two) times daily.      fexofenadine-pseudoephedrine 180-240 MG per 24 hr tablet   Commonly known as: ALLEGRA-D 24   Take 1 tablet by mouth every morning.      fluticasone 50 MCG/ACT nasal spray   Commonly known as: FLONASE   Place 2 sprays into the nose every morning.      HYDROcodone-acetaminophen 10-325 MG per tablet   Commonly known as: NORCO   Take 1 tablet by mouth at bedtime as needed. For pain      ketotifen 0.025 % ophthalmic solution   Commonly known as: ZADITOR   Place 1 drop into both eyes 2 (two) times daily.      lactulose 10 GM/15ML solution   Commonly known as: CHRONULAC   Take 20 g by mouth at bedtime as needed. For constipation      lamoTRIgine 100 MG tablet   Commonly known as: LAMICTAL   Take 100 mg by mouth 2 (two) times daily.      levETIRAcetam 250 MG tablet   Commonly known as: KEPPRA   Take 250 mg by mouth every 12 (twelve) hours.      levothyroxine 25 MCG tablet   Commonly known as: SYNTHROID, LEVOTHROID   Take 25 mcg by mouth daily before breakfast.      LORazepam 1 MG tablet   Commonly  known as: ATIVAN   Take 1 mg by mouth every 4 (four) hours as needed. For agitation      montelukast 10 MG tablet   Commonly known as: SINGULAIR   Take 10 mg by mouth every morning.      nitroGLYCERIN 0.4 MG SL tablet   Commonly known as: NITROSTAT   Place 0.4 mg under the tongue every 5 (five) minutes x 3 doses as needed. For chest pain      omeprazole 20 MG capsule   Commonly known as: PRILOSEC   Take 20 mg by mouth every morning.      oxyCODONE 5 MG immediate release tablet   Commonly known as: Oxy IR/ROXICODONE   Take 5 mg by mouth 2 (two) times daily. Hold for sedation      pravastatin 40 MG tablet   Commonly known as: PRAVACHOL   Take 40 mg by mouth every evening.      risperiDONE 2 MG tablet   Commonly known as: RISPERDAL   Take 2  mg by mouth 2 (two) times daily.      sodium chloride 0.65 % nasal spray   Commonly known as: OCEAN   Place 1 spray into the nose 2 (two) times daily.      vitamin C 500 MG tablet   Commonly known as: ASCORBIC ACID   Take 500 mg by mouth 2 (two) times daily.      Vitamin D 400 UNITS capsule   Take 400 Units by mouth every morning.         Significant Diagnostic Studies:  Dg Chest 2 View  12/19/2011  *RADIOLOGY REPORT*  Clinical Data: Shortness of breath, weakness and cough.  CHEST - 2 VIEW  Comparison: Chest x-ray 12/03/2011.  Findings: Lung volumes are low.  No consolidative airspace disease. No definite pleural effusions.  No evidence of pulmonary edema. Heart size is borderline enlarged. The patient is rotated to the right on today's exam, resulting in distortion of the mediastinal contours and reduced diagnostic sensitivity and specificity for mediastinal pathology.  IMPRESSION: 1. No radiographic evidence of acute cardiopulmonary disease.   Original Report Authenticated By: Florencia Reasons, M.D.     Brief H and P: For complete details please refer to admission H and P, but in brief the pt has had a significant stone burden in  her right kidney, necessitating an initial right PCNL. She presents for the 2nd and hopefully last procedure for removal of fragments ureteroscopically. Hospital Course:  Kristi Perry was taken to the OR upon arrival, and underwent a ureteroscopic stone procedure with holmium laser.Marland Kitchen Her nephrostomy tube was left in , as she continued to have some remaining fragments following the procedure. Her postoperative course was uncomplicated and she was discharged to her SNF.  Day of Discharge BP 121/70  Pulse 102  Temp 99 F (37.2 C) (Oral)  Resp 15  Ht 5\' 3"  (1.6 m)  Wt 93.5 kg (206 lb 2.1 oz)  BMI 36.51 kg/m2  SpO2 96%  No results found for this or any previous visit (from the past 24 hour(s)).  Physical Exam: General: Alert and awake oriented x3 not in any acute distress. HEENT: anicteric sclera, pupils reactive to light and accommodation CVS: S1-S2 clear no murmur rubs or gallops Chest: clear to auscultation bilaterally, no wheezing rales or rhonchi Abdomen: soft nontender, nondistended, normal bowel sounds, no organomegaly Extremities: no cyanosis, clubbing or edema noted bilaterally Neuro: Cranial nerves II-XII intact, no focal neurological deficits  Disposition: SNF  Diet: No restrictions  Activity: As tolerated   Disposition and Follow-up:     Discharge Orders    Future Appointments: Provider: Department: Dept Phone: Center:   01/21/2012 11:00 AM Vesta Mixer, MD [facility] [id number]     Future Orders Please Complete By Expires   Discharge patient           TESTS THAT NEED FOLLOW-UP N/A  DISCHARGE FOLLOW-UP   Time spent on Discharge: 10 minutes  Signed: Chelsea Aus 12/24/2011, 6:03 AM

## 2012-01-14 ENCOUNTER — Encounter (HOSPITAL_COMMUNITY): Payer: Self-pay | Admitting: Emergency Medicine

## 2012-01-14 ENCOUNTER — Emergency Department (HOSPITAL_COMMUNITY): Payer: Medicare Other

## 2012-01-14 ENCOUNTER — Emergency Department (HOSPITAL_COMMUNITY)
Admission: EM | Admit: 2012-01-14 | Discharge: 2012-01-15 | Disposition: A | Discharge status: Home / Self Care | Payer: Medicare Other | Attending: Emergency Medicine | Admitting: Emergency Medicine

## 2012-01-14 DIAGNOSIS — F329 Major depressive disorder, single episode, unspecified: Secondary | ICD-10-CM | POA: Insufficient documentation

## 2012-01-14 DIAGNOSIS — I10 Essential (primary) hypertension: Secondary | ICD-10-CM | POA: Insufficient documentation

## 2012-01-14 DIAGNOSIS — Z8701 Personal history of pneumonia (recurrent): Secondary | ICD-10-CM | POA: Insufficient documentation

## 2012-01-14 DIAGNOSIS — R0602 Shortness of breath: Secondary | ICD-10-CM | POA: Insufficient documentation

## 2012-01-14 DIAGNOSIS — F3289 Other specified depressive episodes: Secondary | ICD-10-CM | POA: Insufficient documentation

## 2012-01-14 DIAGNOSIS — E785 Hyperlipidemia, unspecified: Secondary | ICD-10-CM | POA: Insufficient documentation

## 2012-01-14 DIAGNOSIS — Z7982 Long term (current) use of aspirin: Secondary | ICD-10-CM | POA: Insufficient documentation

## 2012-01-14 DIAGNOSIS — J069 Acute upper respiratory infection, unspecified: Secondary | ICD-10-CM | POA: Insufficient documentation

## 2012-01-14 DIAGNOSIS — E039 Hypothyroidism, unspecified: Secondary | ICD-10-CM | POA: Insufficient documentation

## 2012-01-14 DIAGNOSIS — Z79899 Other long term (current) drug therapy: Secondary | ICD-10-CM | POA: Insufficient documentation

## 2012-01-14 DIAGNOSIS — J309 Allergic rhinitis, unspecified: Secondary | ICD-10-CM | POA: Insufficient documentation

## 2012-01-14 DIAGNOSIS — F411 Generalized anxiety disorder: Secondary | ICD-10-CM | POA: Insufficient documentation

## 2012-01-14 DIAGNOSIS — Z8619 Personal history of other infectious and parasitic diseases: Secondary | ICD-10-CM | POA: Insufficient documentation

## 2012-01-14 DIAGNOSIS — G43909 Migraine, unspecified, not intractable, without status migrainosus: Secondary | ICD-10-CM | POA: Insufficient documentation

## 2012-01-14 MED ORDER — GUAIFENESIN 100 MG/5ML PO SOLN
5.0000 mL | Freq: Once | ORAL | Status: AC
  Administered 2012-01-14: 100 mg via ORAL
  Filled 2012-01-14: qty 5

## 2012-01-14 NOTE — ED Notes (Signed)
[facility] requested for transfer. 

## 2012-01-14 NOTE — ED Notes (Signed)
Per EMS: Pt c/o of chest wall pain and shortness of breath for two weeks. Pt was diagnosed two  Weeks ago with bronchitis and has recently finished antibiotics for treatment. Pt is from  [facility]. Pt is also c/o left hand pain.

## 2012-01-14 NOTE — ED Notes (Signed)
Bed:WA06<BR> Expected date:<BR> Expected time:<BR> Means of arrival:<BR> Comments:<BR>

## 2012-01-14 NOTE — ED Provider Notes (Signed)
History     CSN: [id number]  Arrival date & time 01/14/12  1955   First MD Initiated Contact with Patient 01/14/12 2149      Chief Complaint  Patient presents with  . Chest Pain  . Shortness of Breath    (Consider location/radiation/quality/duration/timing/severity/associated sxs/prior treatment) HPI Comments: Kristi Perry is a 46 y.o. Female who presents for evaluation of chest pain, shortness of breath, and cough. She also is concerned about bilateral hand pain. "I want an x-ray of the hands, because I have arthritis." No trauma to chest or hands. No known fever. She is a poor historian.  Patient is a 46 y.o. female presenting with chest pain and shortness of breath. The history is provided by the patient.  Chest Pain Primary symptoms include shortness of breath.    Shortness of Breath  Associated symptoms include chest pain and shortness of breath.    Past Medical History  Diagnosis Date  . Sepsis(995.91)   . Hypertension   . DVT (deep vein thrombosis) in pregnancy   . Depression   . Chronic pain syndrome   . Allergic rhinitis   . Acute respiratory failure   . Epilepsy   . Migraine   . Chronic airway obstruction   . Cerebral palsy   . Dysphagia   . Hiatal hernia   . Morbid obesity 09-25-11    requires Hoyer lift. pt. doesn't stand or ambulate.  . Incontinent of urine 09-25-11    hx.  . Incontinent of feces 09-25-11    hx.  . Vitamin D deficiency   . Sepsis(995.91)     hx of   . Hyperlipidemia   . Mild intellectual disabilities   . Neurogenic bladder   . Hypothyroidism   . Anxiety   . Unspecified psychosis   . Muscle weakness   . Dysphasia   . Pneumonia     hx of asp pneumonia 1/11-1/19/12  . Sacral decubitus ulcer     hx of    Past Surgical History  Procedure Date  . Nephrolithotomy 10/07/2011    Procedure: NEPHROLITHOTOMY PERCUTANEOUS;  Surgeon: Marcine Matar, MD;  Location: [facility];  Service: Urology;  Laterality: Right;  kidney   .  Cystoscopy with ureteroscopy 11/07/2011    Procedure: CYSTOSCOPY WITH URETEROSCOPY;  Surgeon: Marcine Matar, MD;  Location: [facility];  Service: Urology;  Laterality: Right;  Removal of right double J stent, Insertion right double J stent  . Stone extraction with basket 11/07/2011    Procedure: STONE EXTRACTION WITH BASKET;  Surgeon: Marcine Matar, MD;  Location: [facility];  Service: Urology;  Laterality: Right;  . Nephrolithotomy 12/06/2011    Procedure: NEPHROLITHOTOMY PERCUTANEOUS;  Surgeon: Marcine Matar, MD;  Location: [facility];  Service: Urology;  Laterality: Right;   REPEAT PCNL     No family history on file.  History  Substance Use Topics  . Smoking status: Never Smoker   . Smokeless tobacco: Not on file  . Alcohol Use: No    OB History    Grav Para Term Preterm Abortions TAB SAB Ect Mult Living                  Review of Systems  Respiratory: Positive for shortness of breath.   Cardiovascular: Positive for chest pain.  All other systems reviewed and are negative.    Allergies  Sulfa antibiotics and Tape  Home Medications   Current Outpatient Rx  Name  Route  Sig  Dispense  Refill  .  ACETAMINOPHEN 500 MG PO TABS   Oral   Take 1,000 mg by mouth 2 (two) times daily.         . ALBUTEROL SULFATE HFA 108 (90 BASE) MCG/ACT IN AERS   Inhalation   Inhale 1-2 puffs into the lungs every 6 (six) hours as needed for wheezing.   1 Inhaler   0   . ASPIRIN EC 81 MG PO TBEC   Oral   Take 81 mg by mouth daily.         Marland Kitchen BACLOFEN 10 MG PO TABS   Oral   Take 10 mg by mouth 4 (four) times daily.          . BUDESONIDE-FORMOTEROL FUMARATE 160-4.5 MCG/ACT IN AERO   Inhalation   Inhale 1 puff into the lungs 2 (two) times daily.         Marland Kitchen VITAMIN D 400 UNITS PO CAPS   Oral   Take 400 Units by mouth every morning.          Marland Kitchen CITALOPRAM HYDROBROMIDE 40 MG PO TABS   Oral   Take 40 mg by mouth at bedtime.         Marland Kitchen CLONAZEPAM 1 MG PO TABS   Oral   Take 1  mg by mouth 2 (two) times daily.          Marland Kitchen FEXOFENADINE-PSEUDOEPHED ER 180-240 MG PO TB24   Oral   Take 1 tablet by mouth every morning.         Marland Kitchen HYDROCODONE-ACETAMINOPHEN 10-325 MG PO TABS   Oral   Take 1 tablet by mouth at bedtime as needed. For pain         . KETOTIFEN FUMARATE 0.025 % OP SOLN   Both Eyes   Place 1 drop into both eyes 2 (two) times daily.         Marland Kitchen LAMOTRIGINE 100 MG PO TABS   Oral   Take 100 mg by mouth 2 (two) times daily.          Marland Kitchen LEVETIRACETAM 250 MG PO TABS   Oral   Take 250 mg by mouth every 12 (twelve) hours.         Marland Kitchen LEVOTHYROXINE SODIUM 25 MCG PO TABS   Oral   Take 25 mcg by mouth daily before breakfast.          . LORAZEPAM 1 MG PO TABS   Oral   Take 1 mg by mouth every 4 (four) hours as needed. For agitation         . MONTELUKAST SODIUM 10 MG PO TABS   Oral   Take 10 mg by mouth every morning.          . CERTA-VITE PO   Oral   Take 1 tablet by mouth daily.         Marland Kitchen OMEPRAZOLE 20 MG PO CPDR   Oral   Take 20 mg by mouth every morning.          . OXYCODONE HCL 5 MG PO TABS   Oral   Take 5 mg by mouth 2 (two) times daily. Hold for sedation         . PRAVASTATIN SODIUM 40 MG PO TABS   Oral   Take 40 mg by mouth daily.          Marland Kitchen PREDNISONE 20 MG PO TABS   Oral   Take 20 mg by mouth 2 (two) times daily. 2 tabs po  daily x 4 days         . RISPERIDONE 2 MG PO TABS   Oral   Take 2 mg by mouth 2 (two) times daily.         Marland Kitchen RISPERIDONE MICROSPHERES 25 MG IM SUSR   Intramuscular   Inject 25 mg into the muscle every 14 (fourteen) days.         . SODIUM CHLORIDE 0.65 % NA SOLN   Nasal   Place 1 spray into the nose 2 (two) times daily.         . VENLAFAXINE HCL ER 150 MG PO CP24   Oral   Take 150 mg by mouth daily with breakfast.         . VITAMIN C 500 MG PO TABS   Oral   Take 500 mg by mouth 2 (two) times daily.           BP 149/69  Pulse 99  Temp 98 F (36.7 C) (Oral)  Resp  19  SpO2 95%  LMP 12/16/2011  Physical Exam  Nursing note and vitals reviewed. Constitutional: She appears well-developed.       Obese  HENT:  Head: Normocephalic and atraumatic.  Eyes: Conjunctivae normal and EOM are normal. Pupils are equal, round, and reactive to light.  Neck: Normal range of motion and phonation normal. Neck supple.  Cardiovascular: Normal rate, regular rhythm and intact distal pulses.   Pulmonary/Chest: Effort normal and breath sounds normal. No respiratory distress. She has no wheezes. She has no rales. She exhibits no tenderness.       Good air movement bilaterally  Abdominal: Soft. She exhibits no distension. There is no tenderness. There is no guarding.  Musculoskeletal: Normal range of motion.  Neurological: She is alert. She has normal strength. No cranial nerve deficit. She exhibits normal muscle tone.       Paraplegic consistent with cerebral palsy  Skin: Skin is warm and dry.  Psychiatric: She has a normal mood and affect. Her behavior is normal. Judgment and thought content normal.    ED Course  Procedures (including critical care time)  Emergency department treatment: Robitussin-DM     Date: 12/20/2011  Rate: 87  Rhythm: normal sinus rhythm  QRS Axis: normal  PR and QT Intervals: normal  ST/T Wave abnormalities: normal  PR and QRS Conduction Disutrbances:none  Narrative Interpretation:   Old EKG Reviewed: unchanged   Labs Reviewed - No data to display Dg Chest 2 View  01/14/2012  *RADIOLOGY REPORT*  Clinical Data: Chest pain, shortness of breath.  CHEST - 2 VIEW  Comparison: 12/19/2011  Findings: There is peribronchial thickening.  Heart is borderline in size.  Low lung volumes.  No confluent airspace opacities or effusions.  No acute bony abnormality.  IMPRESSION: Low lung volumes.  Bronchitic changes.   Original Report Authenticated By: Charlett Nose, M.D.      1. URI (upper respiratory infection)       MDM  Nonspecific, cough,  and mild shortness of breath. Possible mild asthma, versus URI. Vital signs, and oxygen saturation are normal.   usual  Plan: Home Medications- usual plus Robitussin; Home Treatments- rest; Recommended follow up- PCP prn   Flint Melter, MD 01/14/12 2330

## 2012-01-21 ENCOUNTER — Ambulatory Visit (INDEPENDENT_AMBULATORY_CARE_PROVIDER_SITE_OTHER): Payer: Medicare Other | Admitting: Cardiovascular Disease

## 2012-01-21 ENCOUNTER — Encounter: Payer: Self-pay | Admitting: Cardiovascular Disease

## 2012-01-21 VITALS — BP 120/60 | HR 94 | Ht 62.0 in

## 2012-01-21 DIAGNOSIS — R079 Chest pain, unspecified: Secondary | ICD-10-CM

## 2012-01-21 NOTE — Patient Instructions (Signed)
Your physician recommends that you schedule a follow-up appointment in: as needed  

## 2012-01-21 NOTE — Progress Notes (Signed)
Kristi Perry Date of Birth  08-18-1965       [facility]    [place] [address]  [address] [place], [place]  [address]   [place], [place]  [address] [phone]     [phone]   Fax  [phone]    Fax [phone]  Problem List: 1. Chest pain 2. Asthma 3. Cerebral Palsy- wheelchair / bed bound 4. Hypertension 5. Chronic pain syndrome 6. Hypothyroidism 7. Hypothyroidism  History of Present Illness:  Kristi Perry  presents today for evaluation of chest pain.  She has been complaining of chest pain for the past several months which she thinks is related to her asthma. Her chest hurts whenever she takes a deep breath and coughs. She has chronic pain. She's been seen in the emergency room on 2 occasions and has had a negative workup.  Her troponin levels have been negative.  Current Outpatient Prescriptions on File Prior to Visit  Medication Sig Dispense Refill  . acetaminophen (TYLENOL) 500 MG tablet Take 1,000 mg by mouth 2 (two) times daily.      Marland Kitchen albuterol (PROVENTIL HFA;VENTOLIN HFA) 108 (90 BASE) MCG/ACT inhaler Inhale 1-2 puffs into the lungs every 6 (six) hours as needed for wheezing.  1 Inhaler  0  . aspirin EC 81 MG tablet Take 81 mg by mouth daily.      . baclofen (LIORESAL) 10 MG tablet Take 10 mg by mouth 4 (four) times daily.       . budesonide-formoterol (SYMBICORT) 160-4.5 MCG/ACT inhaler Inhale 1 puff into the lungs 2 (two) times daily.      . Cholecalciferol (VITAMIN D) 400 UNITS capsule Take 400 Units by mouth every morning.       . citalopram (CELEXA) 40 MG tablet Take 40 mg by mouth at bedtime.      . clonazePAM (KLONOPIN) 1 MG tablet Take 1 mg by mouth 2 (two) times daily.       . fexofenadine-pseudoephedrine (ALLEGRA-D 24) 180-240 MG per 24 hr tablet Take 1 tablet by mouth every morning.      Marland Kitchen HYDROcodone-acetaminophen (NORCO) 10-325 MG per tablet Take 1 tablet by mouth at bedtime as needed. For pain      .  ketotifen (ZADITOR) 0.025 % ophthalmic solution Place 1 drop into both eyes 2 (two) times daily.      Marland Kitchen lamoTRIgine (LAMICTAL) 100 MG tablet Take 100 mg by mouth 2 (two) times daily.       Marland Kitchen levETIRAcetam (KEPPRA) 250 MG tablet Take 250 mg by mouth every 12 (twelve) hours.      Marland Kitchen levothyroxine (SYNTHROID, LEVOTHROID) 25 MCG tablet Take 25 mcg by mouth daily before breakfast.       . LORazepam (ATIVAN) 1 MG tablet Take 1 mg by mouth every 4 (four) hours as needed. For agitation      . montelukast (SINGULAIR) 10 MG tablet Take 10 mg by mouth every morning.       . Multiple Vitamins-Minerals (CERTA-VITE PO) Take 1 tablet by mouth daily.      Marland Kitchen omeprazole (PRILOSEC) 20 MG capsule Take 20 mg by mouth every morning.       Marland Kitchen oxyCODONE (OXY IR/ROXICODONE) 5 MG immediate release tablet Take 5 mg by mouth 2 (two) times daily. Hold for sedation      . pravastatin (PRAVACHOL) 40 MG tablet Take 40 mg by mouth daily.       . predniSONE (DELTASONE) 20  MG tablet Take 20 mg by mouth 2 (two) times daily. 2 tabs po daily x 4 days      . risperiDONE (RISPERDAL) 2 MG tablet Take 2 mg by mouth 2 (two) times daily.      . risperiDONE microspheres (RISPERDAL CONSTA) 25 MG injection Inject 25 mg into the muscle every 14 (fourteen) days.      . sodium chloride (OCEAN) 0.65 % nasal spray Place 1 spray into the nose 2 (two) times daily.      Marland Kitchen venlafaxine XR (EFFEXOR-XR) 150 MG 24 hr capsule Take 150 mg by mouth daily with breakfast.      . vitamin C (ASCORBIC ACID) 500 MG tablet Take 500 mg by mouth 2 (two) times daily.        Allergies  Allergen Reactions  . Sulfa Antibiotics Hives  . Tape Rash    Paper tape    Past Medical History  Diagnosis Date  . Sepsis(995.91)   . Hypertension   . DVT (deep vein thrombosis) in pregnancy   . Depression   . Chronic pain syndrome   . Allergic rhinitis   . Acute respiratory failure   . Epilepsy   . Migraine   . Chronic airway obstruction   . Cerebral palsy   . Dysphagia    . Hiatal hernia   . Morbid obesity 09-25-11    requires Hoyer lift. pt. doesn't stand or ambulate.  . Incontinent of urine 09-25-11    hx.  . Incontinent of feces 09-25-11    hx.  . Vitamin D deficiency   . Sepsis(995.91)     hx of   . Hyperlipidemia   . Mild intellectual disabilities   . Neurogenic bladder   . Hypothyroidism   . Anxiety   . Unspecified psychosis   . Muscle weakness   . Dysphasia   . Pneumonia     hx of asp pneumonia 1/11-1/19/12  . Sacral decubitus ulcer     hx of    Past Surgical History  Procedure Date  . Nephrolithotomy 10/07/2011    Procedure: NEPHROLITHOTOMY PERCUTANEOUS;  Surgeon: Marcine Matar, MD;  Location: [facility];  Service: Urology;  Laterality: Right;  kidney   . Cystoscopy with ureteroscopy 11/07/2011    Procedure: CYSTOSCOPY WITH URETEROSCOPY;  Surgeon: Marcine Matar, MD;  Location: [facility];  Service: Urology;  Laterality: Right;  Removal of right double J stent, Insertion right double J stent  . Stone extraction with basket 11/07/2011    Procedure: STONE EXTRACTION WITH BASKET;  Surgeon: Marcine Matar, MD;  Location: [facility];  Service: Urology;  Laterality: Right;  . Nephrolithotomy 12/06/2011    Procedure: NEPHROLITHOTOMY PERCUTANEOUS;  Surgeon: Marcine Matar, MD;  Location: [facility];  Service: Urology;  Laterality: Right;   REPEAT PCNL     History  Smoking status  . Never Smoker   Smokeless tobacco  . Not on file    History  Alcohol Use No    No family history on file.  Reviw of Systems:  Reviewed in the HPI.  All other systems are negative.  Physical Exam: Blood pressure 120/60, pulse 94, height 5\' 2"  (1.575 m), SpO2 91.00%. General: Chronically ill-appearing female examined on the stretcher.   Head: Normocephalic, atraumatic, sclera non-icteric, mucus membranes are moist,   Neck: her neck is thick,  Unable to assess her JVP  Lungs: she coughed violently with a deeb breath  Heart: regular rate.  normal  S1 S2. No  murmurs, gallops or rubs.  Abdomen: obese  Msk:  Strength and tone are normal  Extremities: no edema  Neuro: Severe physical impairment due to cerebral palsy.  Psych:    ECG: Nov. 19, 2013 - NSR. Normal ECG.  Assessment / Plan:

## 2012-01-21 NOTE — Assessment & Plan Note (Addendum)
Joia presents with several episodes of atypical chest pain. She thinks that these are related to her asthma. She has chronic pain syndrome and these chest  pains are somewhat difficult to sort out but these sound atypical. She's not able to walk so I am unable to say that these are worsened with exertion. She'll have these episodes for hours at a time. She has been seen in the emergency room on several occasions and has had a negative workup. Her troponin levels have been negative despite having had hours of chest pain.  She is wheelchair-bound due to cerebral palsy. She was examined today on the EMS gurney.  I do not think that her episodes are anginal in quality.  I have talked to the nurse  Practitioner.  She's not a good candidate for invasive or interventional procedures. I think that she should be treated conservatively.  We have not scheduled her for a return visit but will see her on as needed basis.

## 2012-02-14 ENCOUNTER — Emergency Department (HOSPITAL_COMMUNITY): Payer: Medicare Other

## 2012-02-14 ENCOUNTER — Encounter (HOSPITAL_COMMUNITY): Payer: Self-pay | Admitting: Emergency Medicine

## 2012-02-14 ENCOUNTER — Emergency Department (HOSPITAL_COMMUNITY)
Admission: EM | Admit: 2012-02-14 | Discharge: 2012-02-15 | Disposition: A | Discharge status: Home / Self Care | Payer: Medicare Other | Attending: Emergency Medicine | Admitting: Emergency Medicine

## 2012-02-14 DIAGNOSIS — J449 Chronic obstructive pulmonary disease, unspecified: Secondary | ICD-10-CM | POA: Insufficient documentation

## 2012-02-14 DIAGNOSIS — R509 Fever, unspecified: Secondary | ICD-10-CM | POA: Insufficient documentation

## 2012-02-14 DIAGNOSIS — Z8669 Personal history of other diseases of the nervous system and sense organs: Secondary | ICD-10-CM | POA: Insufficient documentation

## 2012-02-14 DIAGNOSIS — R300 Dysuria: Secondary | ICD-10-CM | POA: Insufficient documentation

## 2012-02-14 DIAGNOSIS — M542 Cervicalgia: Secondary | ICD-10-CM | POA: Insufficient documentation

## 2012-02-14 DIAGNOSIS — R5383 Other fatigue: Secondary | ICD-10-CM

## 2012-02-14 DIAGNOSIS — R630 Anorexia: Secondary | ICD-10-CM | POA: Insufficient documentation

## 2012-02-14 DIAGNOSIS — F411 Generalized anxiety disorder: Secondary | ICD-10-CM | POA: Insufficient documentation

## 2012-02-14 DIAGNOSIS — G894 Chronic pain syndrome: Secondary | ICD-10-CM | POA: Insufficient documentation

## 2012-02-14 DIAGNOSIS — R5381 Other malaise: Secondary | ICD-10-CM | POA: Insufficient documentation

## 2012-02-14 DIAGNOSIS — Z8701 Personal history of pneumonia (recurrent): Secondary | ICD-10-CM | POA: Insufficient documentation

## 2012-02-14 DIAGNOSIS — A419 Sepsis, unspecified organism: Secondary | ICD-10-CM | POA: Insufficient documentation

## 2012-02-14 DIAGNOSIS — G43909 Migraine, unspecified, not intractable, without status migrainosus: Secondary | ICD-10-CM | POA: Insufficient documentation

## 2012-02-14 DIAGNOSIS — Z8719 Personal history of other diseases of the digestive system: Secondary | ICD-10-CM | POA: Insufficient documentation

## 2012-02-14 DIAGNOSIS — M79609 Pain in unspecified limb: Secondary | ICD-10-CM | POA: Insufficient documentation

## 2012-02-14 DIAGNOSIS — J4489 Other specified chronic obstructive pulmonary disease: Secondary | ICD-10-CM | POA: Insufficient documentation

## 2012-02-14 DIAGNOSIS — Z79899 Other long term (current) drug therapy: Secondary | ICD-10-CM | POA: Insufficient documentation

## 2012-02-14 DIAGNOSIS — F3289 Other specified depressive episodes: Secondary | ICD-10-CM | POA: Insufficient documentation

## 2012-02-14 DIAGNOSIS — I10 Essential (primary) hypertension: Secondary | ICD-10-CM | POA: Insufficient documentation

## 2012-02-14 DIAGNOSIS — Z872 Personal history of diseases of the skin and subcutaneous tissue: Secondary | ICD-10-CM | POA: Insufficient documentation

## 2012-02-14 DIAGNOSIS — J9600 Acute respiratory failure, unspecified whether with hypoxia or hypercapnia: Secondary | ICD-10-CM | POA: Insufficient documentation

## 2012-02-14 DIAGNOSIS — Z7982 Long term (current) use of aspirin: Secondary | ICD-10-CM | POA: Insufficient documentation

## 2012-02-14 DIAGNOSIS — R51 Headache: Secondary | ICD-10-CM | POA: Insufficient documentation

## 2012-02-14 DIAGNOSIS — R109 Unspecified abdominal pain: Secondary | ICD-10-CM | POA: Insufficient documentation

## 2012-02-14 DIAGNOSIS — G40909 Epilepsy, unspecified, not intractable, without status epilepticus: Secondary | ICD-10-CM | POA: Insufficient documentation

## 2012-02-14 DIAGNOSIS — H53149 Visual discomfort, unspecified: Secondary | ICD-10-CM | POA: Insufficient documentation

## 2012-02-14 DIAGNOSIS — F329 Major depressive disorder, single episode, unspecified: Secondary | ICD-10-CM | POA: Insufficient documentation

## 2012-02-14 DIAGNOSIS — E785 Hyperlipidemia, unspecified: Secondary | ICD-10-CM | POA: Insufficient documentation

## 2012-02-14 DIAGNOSIS — R8281 Pyuria: Secondary | ICD-10-CM

## 2012-02-14 DIAGNOSIS — R82998 Other abnormal findings in urine: Secondary | ICD-10-CM | POA: Insufficient documentation

## 2012-02-14 DIAGNOSIS — M79643 Pain in unspecified hand: Secondary | ICD-10-CM

## 2012-02-14 DIAGNOSIS — E039 Hypothyroidism, unspecified: Secondary | ICD-10-CM | POA: Insufficient documentation

## 2012-02-14 DIAGNOSIS — M549 Dorsalgia, unspecified: Secondary | ICD-10-CM | POA: Insufficient documentation

## 2012-02-14 DIAGNOSIS — R0789 Other chest pain: Secondary | ICD-10-CM | POA: Insufficient documentation

## 2012-02-14 DIAGNOSIS — F70 Mild intellectual disabilities: Secondary | ICD-10-CM | POA: Insufficient documentation

## 2012-02-14 LAB — URINALYSIS, ROUTINE W REFLEX MICROSCOPIC
Bilirubin Urine: NEGATIVE
Ketones, ur: NEGATIVE mg/dL
Nitrite: POSITIVE — AB
Protein, ur: NEGATIVE mg/dL
Specific Gravity, Urine: 1.023 (ref 1.005–1.030)
Urobilinogen, UA: 0.2 mg/dL (ref 0.0–1.0)

## 2012-02-14 LAB — BASIC METABOLIC PANEL
BUN: 16 mg/dL (ref 6–23)
Calcium: 9.8 mg/dL (ref 8.4–10.5)
GFR calc Af Amer: >90 mL/min (ref >90)
GFR calc non Af Amer: >90 mL/min (ref >90)
Glucose, Bld: 85 mg/dL (ref 70–99)
Potassium: 3.6 mEq/L (ref 3.5–5.1)

## 2012-02-14 LAB — CBC WITH DIFFERENTIAL/PLATELET
Basophils Relative: 0 % (ref 0–1)
Eosinophils Absolute: 0.2 10*3/uL (ref 0.0–0.7)
Eosinophils Relative: 3 % (ref 0–5)
Lymphs Abs: 1.7 10*3/uL (ref 0.7–4.0)
MCH: 28.7 pg (ref 26.0–34.0)
MCHC: 32.8 g/dL (ref 30.0–36.0)
MCV: 87.7 fL (ref 78.0–100.0)
Monocytes Relative: 10 % (ref 3–12)
Neutrophils Relative %: 65 % (ref 43–77)
Platelets: 206 10*3/uL (ref 150–400)

## 2012-02-14 LAB — TROPONIN I: Troponin I: <0.3 ng/mL (ref <0.30)

## 2012-02-14 LAB — URINE MICROSCOPIC-ADD ON

## 2012-02-14 MED ORDER — NITROFURANTOIN MONOHYD MACRO 100 MG PO CAPS: 100.0000 mg | ORAL_CAPSULE | Freq: Two times a day (BID) | ORAL | Status: DC

## 2012-02-14 MED ORDER — OXYCODONE-ACETAMINOPHEN 5-325 MG PO TABS
1.0000 | ORAL_TABLET | Freq: Once | ORAL | Status: AC
  Administered 2012-02-14: 1 via ORAL
  Filled 2012-02-14: qty 1
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
## 2012-02-14 NOTE — ED Notes (Signed)
Patient transported to X-ray 

## 2012-02-14 NOTE — ED Notes (Signed)
[id number]> Expected date:<BR> Expected time:<BR> Means of arrival:<BR> Comments:<BR> Generalized pain, CP

## 2012-02-14 NOTE — ED Notes (Signed)
Dr Rubin Payor at bedside. [facility] called for patient transfer.

## 2012-02-14 NOTE — ED Notes (Signed)
From [facility] for complaint of generalized pain and weakness. Pt has refused to take meds today. Feeling anxious because she is having cataract sx next week.

## 2012-02-14 NOTE — ED Provider Notes (Signed)
History     CSN: [id number]  Arrival date & time 02/14/12  4098   First MD Initiated Contact with Patient 02/14/12 1808      Chief Complaint  Patient presents with  . Generalized Body Aches    (Consider location/radiation/quality/duration/timing/severity/associated sxs/prior treatment) The history is provided by the patient.   patient presents with multiple complaints. She states she's been dizzy weak and passed out. She states she didn't take her medicines today. She's nervous because she is having eye surgery. She has headache, chest pain, abdominal pain, arm pain, leg pain, dysuria, shortness of breath, cough, chills, decreased appetite, and trouble seeing. Patient states that she thinks she is to come in to the hospital from the nursing home for a few days until she has her cataract surgery at [facility].  Past Medical History  Diagnosis Date  . Sepsis(995.91)   . Hypertension   . DVT (deep vein thrombosis) in pregnancy   . Depression   . Chronic pain syndrome   . Allergic rhinitis   . Acute respiratory failure   . Epilepsy   . Migraine   . Chronic airway obstruction   . Cerebral palsy   . Dysphagia   . Hiatal hernia   . Morbid obesity 09-25-11    requires Hoyer lift. pt. doesn't stand or ambulate.  . Incontinent of urine 09-25-11    hx.  . Incontinent of feces 09-25-11    hx.  . Vitamin D deficiency   . Sepsis(995.91)     hx of   . Hyperlipidemia   . Mild intellectual disabilities   . Neurogenic bladder   . Hypothyroidism   . Anxiety   . Unspecified psychosis   . Muscle weakness   . Dysphasia   . Pneumonia     hx of asp pneumonia 1/11-1/19/12  . Sacral decubitus ulcer     hx of    Past Surgical History  Procedure Date  . Nephrolithotomy 10/07/2011    Procedure: NEPHROLITHOTOMY PERCUTANEOUS;  Surgeon: Marcine Matar, MD;  Location: [facility];  Service: Urology;  Laterality: Right;  kidney   . Cystoscopy with ureteroscopy 11/07/2011    Procedure: CYSTOSCOPY  WITH URETEROSCOPY;  Surgeon: Marcine Matar, MD;  Location: [facility];  Service: Urology;  Laterality: Right;  Removal of right double J stent, Insertion right double J stent  . Stone extraction with basket 11/07/2011    Procedure: STONE EXTRACTION WITH BASKET;  Surgeon: Marcine Matar, MD;  Location: [facility];  Service: Urology;  Laterality: Right;  . Nephrolithotomy 12/06/2011    Procedure: NEPHROLITHOTOMY PERCUTANEOUS;  Surgeon: Marcine Matar, MD;  Location: [facility];  Service: Urology;  Laterality: Right;   REPEAT PCNL     No family history on file.  History  Substance Use Topics  . Smoking status: Never Smoker   . Smokeless tobacco: Not on file  . Alcohol Use: No    OB History    Grav Para Term Preterm Abortions TAB SAB Ect Mult Living                  Review of Systems  Constitutional: Positive for fever, appetite change and fatigue.  HENT: Positive for neck pain.   Eyes: Positive for visual disturbance.  Respiratory: Positive for chest tightness.   Cardiovascular: Positive for chest pain.  Gastrointestinal: Positive for abdominal pain.  Genitourinary: Positive for dysuria and flank pain.  Musculoskeletal: Positive for back pain.  Neurological: Positive for headaches. Negative for speech difficulty.  Psychiatric/Behavioral: The patient  is nervous/anxious.     Allergies  Sulfa antibiotics and Tape  Home Medications   Current Outpatient Rx  Name  Route  Sig  Dispense  Refill  . ACETAMINOPHEN 500 MG PO TABS   Oral   Take 1,000 mg by mouth 2 (two) times daily.         . ALBUTEROL SULFATE HFA 108 (90 BASE) MCG/ACT IN AERS   Inhalation   Inhale 1-2 puffs into the lungs every 6 (six) hours as needed for wheezing.   1 Inhaler   0   . ASPIRIN EC 81 MG PO TBEC   Oral   Take 81 mg by mouth daily.         . AZELASTINE HCL 137 MCG/SPRAY NA SOLN   Nasal   Place 1 spray into the nose 2 (two) times daily. Use in each nostril as directed         . BACLOFEN 10  MG PO TABS   Oral   Take 10 mg by mouth 4 (four) times daily.          . BUDESONIDE-FORMOTEROL FUMARATE 160-4.5 MCG/ACT IN AERO   Inhalation   Inhale 1 puff into the lungs 2 (two) times daily.         Marland Kitchen VITAMIN D 400 UNITS PO CAPS   Oral   Take 400 Units by mouth every morning.          Marland Kitchen CITALOPRAM HYDROBROMIDE 40 MG PO TABS   Oral   Take 40 mg by mouth at bedtime.         Marland Kitchen CLONAZEPAM 1 MG PO TABS   Oral   Take 1 mg by mouth 2 (two) times daily.          Marland Kitchen FEXOFENADINE-PSEUDOEPHED ER 180-240 MG PO TB24   Oral   Take 1 tablet by mouth every morning.         Marland Kitchen HYDROCODONE-ACETAMINOPHEN 10-325 MG PO TABS   Oral   Take 1 tablet by mouth at bedtime as needed. For pain         . KETOTIFEN FUMARATE 0.025 % OP SOLN   Both Eyes   Place 1 drop into both eyes 2 (two) times daily.         Marland Kitchen LAMOTRIGINE 100 MG PO TABS   Oral   Take 100 mg by mouth 2 (two) times daily.          Marland Kitchen LEVETIRACETAM 250 MG PO TABS   Oral   Take 250 mg by mouth every 12 (twelve) hours.         Marland Kitchen LEVOTHYROXINE SODIUM 25 MCG PO TABS   Oral   Take 25 mcg by mouth daily before breakfast.          . LORAZEPAM 1 MG PO TABS   Oral   Take 1 mg by mouth every 4 (four) hours as needed. For agitation         . MONTELUKAST SODIUM 10 MG PO TABS   Oral   Take 10 mg by mouth every morning.          . CERTA-VITE PO   Oral   Take 1 tablet by mouth daily.         Marland Kitchen OMEPRAZOLE 20 MG PO CPDR   Oral   Take 20 mg by mouth every morning.          . OXYCODONE HCL 5 MG PO TABS   Oral   Take 5  mg by mouth 2 (two) times daily. Hold for sedation         . PRAVASTATIN SODIUM 40 MG PO TABS   Oral   Take 40 mg by mouth daily.          Marland Kitchen PROPYLENE GLYCOL 0.6 % OP SOLN   Ophthalmic   Apply 1 drop to eye 4 (four) times daily.         Marland Kitchen RANITIDINE HCL 150 MG PO TABS   Oral   Take 150 mg by mouth 2 (two) times daily.         Marland Kitchen RISPERIDONE 2 MG PO TABS   Oral   Take 2 mg by  mouth 2 (two) times daily.         Marland Kitchen RISPERIDONE MICROSPHERES 25 MG IM SUSR   Intramuscular   Inject 25 mg into the muscle every 14 (fourteen) days.         . SODIUM CHLORIDE 0.65 % NA SOLN   Nasal   Place 1 spray into the nose 2 (two) times daily.         . VENLAFAXINE HCL ER 150 MG PO CP24   Oral   Take 150 mg by mouth daily with breakfast.         . VITAMIN C 500 MG PO TABS   Oral   Take 500 mg by mouth 2 (two) times daily.         Marland Kitchen NITROFURANTOIN MONOHYD MACRO 100 MG PO CAPS   Oral   Take 1 capsule (100 mg total) by mouth 2 (two) times daily.   14 capsule   0     BP 132/77  Pulse 109  Temp 98.6 F (37 C)  Resp 18  SpO2 91%  LMP 02/04/2012  Physical Exam  Nursing note and vitals reviewed. Constitutional: She is oriented to person, place, and time. She appears well-developed.  HENT:  Head: Normocephalic and atraumatic.  Eyes:       Dysconjugate gaze.  Neck: Normal range of motion. Neck supple.  Cardiovascular: Normal rate, regular rhythm and normal heart sounds.   No murmur heard. Pulmonary/Chest: Effort normal and breath sounds normal. No respiratory distress.  Abdominal: Soft. Bowel sounds are normal. She exhibits no distension. There is no tenderness. There is no rebound and no guarding.  Musculoskeletal: She exhibits edema.  Neurological: She is alert and oriented to person, place, and time. No cranial nerve deficit.  Skin: Skin is warm and dry.  Psychiatric: She has a normal mood and affect. Her speech is normal.    ED Course  Procedures (including critical care time)  Labs Reviewed  CBC WITH DIFFERENTIAL - Abnormal; Notable for the following:    RDW 15.7 (*)     All other components within normal limits  URINALYSIS, ROUTINE W REFLEX MICROSCOPIC - Abnormal; Notable for the following:    APPearance CLOUDY (*)     Hgb urine dipstick LARGE (*)     Nitrite POSITIVE (*)     Leukocytes, UA LARGE (*)     All other components within normal  limits  URINE MICROSCOPIC-ADD ON - Abnormal; Notable for the following:    Bacteria, UA MANY (*)     All other components within normal limits  BASIC METABOLIC PANEL  TROPONIN I  URINE CULTURE   Dg Chest 2 View  02/14/2012  *RADIOLOGY REPORT*  Clinical Data: Cough, congestion, chest pain  CHEST - 2 VIEW  Comparison: 01/14/2012  Findings: Normal heart  size, mediastinal contours, and pulmonary vascularity. Minimal chronic peribronchial thickening. No infiltrate, pleural effusion or pneumothorax. Rotated exam with scoliosis noted.  IMPRESSION: No acute abnormalities.   Original Report Authenticated By: Ulyses Southward, M.D.    Dg Hand Complete Left  02/14/2012  *RADIOLOGY REPORT*  Clinical Data: Bilateral hand and wrist pain after injury.  LEFT HAND - COMPLETE 3+ VIEW  Comparison: 11/18/2004.  Findings: Mild degenerative changes in the interphalangeal joints. No evidence of acute fracture or subluxation.  Deformity left fifth finger with extension of the distal interphalangeal joint and flexion of the proximal interphalangeal joint.  No focal bone lesion or bone destruction.  Bone cortex and trabecular architecture appear intact.  No significant changes since the previous study.  IMPRESSION: No acute bony abnormalities.   Original Report Authenticated By: Burman Nieves, M.D.    Dg Hand Complete Right  02/14/2012  *RADIOLOGY REPORT*  Clinical Data: Bilateral hand pain after injury.  RIGHT HAND - COMPLETE 3+ VIEW  Comparison: 11/18/2004.  Findings: Degenerative changes in the interphalangeal joints. Flexion deformity of the proximal interphalangeal joint of the fifth finger.  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  No evidence of acute fracture or subluxation.  No focal bone lesion or bone destruction.  Bone cortex and trabecular architecture appear intact.  No significant change since previous study.  IMPRESSION: No acute bony abnormalities.   Original Report  Authenticated By: Burman Nieves, M.D.      1. Fatigue   2. Hand pain   3. Pyuria       MDM  Patient with multiple complaints. Generalized weakness. Urinalysis shows chronic pyuria. After discussion with urology this may not be infection, however Macrobid was started due to the results from the previous culture. Laboratories otherwise reassuring. Patient's been complaining of pain in her bilateral hands over the last month or 2. X-rays were now done and were negative.        Juliet Rude. Rubin Payor, MD 02/15/12 2956

## 2012-02-17 LAB — URINE CULTURE: Colony Count: >=100000

## 2012-02-19 NOTE — ED Notes (Signed)
+   urine culture. Treated with Macrobid, sensitive to same per protocol MD.

## 2012-02-27 ENCOUNTER — Inpatient Hospital Stay (HOSPITAL_COMMUNITY)
Admission: EM | Admit: 2012-02-27 | Discharge: 2012-03-02 | DRG: 195 | Disposition: A | Discharge status: Skilled Nursing Facility | Payer: Medicare Other | Attending: Internal Medicine | Admitting: Internal Medicine

## 2012-02-27 ENCOUNTER — Inpatient Hospital Stay (HOSPITAL_COMMUNITY): Payer: Medicare Other

## 2012-02-27 ENCOUNTER — Encounter (HOSPITAL_COMMUNITY): Payer: Self-pay | Admitting: Unknown Physician Specialty

## 2012-02-27 ENCOUNTER — Emergency Department (HOSPITAL_COMMUNITY): Payer: Medicare Other

## 2012-02-27 DIAGNOSIS — G40909 Epilepsy, unspecified, not intractable, without status epilepticus: Secondary | ICD-10-CM | POA: Diagnosis present

## 2012-02-27 DIAGNOSIS — F329 Major depressive disorder, single episode, unspecified: Secondary | ICD-10-CM | POA: Diagnosis present

## 2012-02-27 DIAGNOSIS — E785 Hyperlipidemia, unspecified: Secondary | ICD-10-CM | POA: Diagnosis present

## 2012-02-27 DIAGNOSIS — I10 Essential (primary) hypertension: Secondary | ICD-10-CM

## 2012-02-27 DIAGNOSIS — R079 Chest pain, unspecified: Secondary | ICD-10-CM

## 2012-02-27 DIAGNOSIS — L8990 Pressure ulcer of unspecified site, unspecified stage: Secondary | ICD-10-CM | POA: Diagnosis present

## 2012-02-27 DIAGNOSIS — R51 Headache: Secondary | ICD-10-CM | POA: Diagnosis present

## 2012-02-27 DIAGNOSIS — G809 Cerebral palsy, unspecified: Secondary | ICD-10-CM | POA: Diagnosis present

## 2012-02-27 DIAGNOSIS — Z993 Dependence on wheelchair: Secondary | ICD-10-CM

## 2012-02-27 DIAGNOSIS — J189 Pneumonia, unspecified organism: Principal | ICD-10-CM

## 2012-02-27 DIAGNOSIS — F3289 Other specified depressive episodes: Secondary | ICD-10-CM | POA: Diagnosis present

## 2012-02-27 DIAGNOSIS — E039 Hypothyroidism, unspecified: Secondary | ICD-10-CM | POA: Diagnosis present

## 2012-02-27 DIAGNOSIS — F411 Generalized anxiety disorder: Secondary | ICD-10-CM | POA: Diagnosis present

## 2012-02-27 DIAGNOSIS — L89109 Pressure ulcer of unspecified part of back, unspecified stage: Secondary | ICD-10-CM | POA: Diagnosis present

## 2012-02-27 DIAGNOSIS — Z66 Do not resuscitate: Secondary | ICD-10-CM | POA: Diagnosis present

## 2012-02-27 DIAGNOSIS — G894 Chronic pain syndrome: Secondary | ICD-10-CM | POA: Diagnosis present

## 2012-02-27 LAB — POCT I-STAT, CHEM 8
BUN: 14 mg/dL (ref 6–23)
Calcium, Ion: 1.26 mmol/L — ABNORMAL HIGH (ref 1.12–1.23)
Hemoglobin: 13.9 g/dL (ref 12.0–15.0)
Sodium: 140 mEq/L (ref 135–145)
TCO2: 26 mmol/L (ref 0–100)

## 2012-02-27 LAB — CBC
HCT: 39.5 % (ref 36.0–46.0)
Hemoglobin: 12.6 g/dL (ref 12.0–15.0)
MCH: 28.6 pg (ref 26.0–34.0)
MCH: 28.2 pg (ref 26.0–34.0)
MCHC: 32.4 g/dL (ref 30.0–36.0)
MCHC: 31.9 g/dL (ref 30.0–36.0)
MCV: 88.3 fL (ref 78.0–100.0)
Platelets: 173 10*3/uL (ref 150–400)
RBC: 4.69 MIL/uL (ref 3.87–5.11)
RBC: 4.47 MIL/uL (ref 3.87–5.11)
RDW: 15.7 % — ABNORMAL HIGH (ref 11.5–15.5)

## 2012-02-27 LAB — HEMOGLOBIN A1C
Hgb A1c MFr Bld: 5.6 % (ref <5.7)
Mean Plasma Glucose: 114 mg/dL (ref <117)

## 2012-02-27 LAB — POCT I-STAT 3, ART BLOOD GAS (G3+)
O2 Saturation: 93 %
TCO2: 28 mmol/L (ref 0–100)
pCO2 arterial: 48.9 mmHg — ABNORMAL HIGH (ref 35.0–45.0)

## 2012-02-27 LAB — TROPONIN I: Troponin I: <0.3 ng/mL (ref <0.30)

## 2012-02-27 LAB — TSH: TSH: 0.789 u[IU]/mL (ref 0.350–4.500)

## 2012-02-27 LAB — POCT I-STAT TROPONIN I: Troponin i, poc: 0 ng/mL (ref 0.00–0.08)

## 2012-02-27 MED ORDER — BUDESONIDE-FORMOTEROL FUMARATE 160-4.5 MCG/ACT IN AERO
1.0000 | INHALATION_SPRAY | Freq: Two times a day (BID) | RESPIRATORY_TRACT | Status: DC
  Administered 2012-02-27 – 2012-03-02 (×8): 1 via RESPIRATORY_TRACT
  Filled 2012-02-27: qty 6

## 2012-02-27 MED ORDER — IPRATROPIUM BROMIDE 0.02 % IN SOLN
0.5000 mg | Freq: Four times a day (QID) | RESPIRATORY_TRACT | Status: DC
  Administered 2012-02-27: 0.5 mg via RESPIRATORY_TRACT
  Filled 2012-02-27: qty 2.5

## 2012-02-27 MED ORDER — RISPERIDONE MICROSPHERES 25 MG IM SUSR
25.0000 mg | INTRAMUSCULAR | Status: DC
  Administered 2012-02-28: 25 mg via INTRAMUSCULAR
  Filled 2012-02-27 (×3): qty 2

## 2012-02-27 MED ORDER — CLONAZEPAM 1 MG PO TABS
1.0000 mg | ORAL_TABLET | Freq: Two times a day (BID) | ORAL | Status: DC
Start: 2012-02-27 — End: 2012-03-02
  Administered 2012-02-27 – 2012-03-02 (×8): 1 mg via ORAL
  Filled 2012-02-27 (×8): qty 1

## 2012-02-27 MED ORDER — CITALOPRAM HYDROBROMIDE 40 MG PO TABS
40.0000 mg | ORAL_TABLET | Freq: Every day | ORAL | Status: DC
  Administered 2012-02-27 – 2012-03-01 (×4): 40 mg via ORAL
  Filled 2012-02-27 (×5): qty 1

## 2012-02-27 MED ORDER — OLOPATADINE HCL 0.1 % OP SOLN
1.0000 [drp] | Freq: Two times a day (BID) | OPHTHALMIC | Status: DC
  Administered 2012-02-27 – 2012-03-02 (×8): 1 [drp] via OPHTHALMIC
  Filled 2012-02-27: qty 5

## 2012-02-27 MED ORDER — MONTELUKAST SODIUM 10 MG PO TABS
10.0000 mg | ORAL_TABLET | Freq: Every morning | ORAL | Status: DC
  Administered 2012-02-28 – 2012-03-02 (×4): 10 mg via ORAL
  Filled 2012-02-27 (×4): qty 1

## 2012-02-27 MED ORDER — SODIUM CHLORIDE 0.9 % IV SOLN
INTRAVENOUS | Status: DC
  Administered 2012-02-28: 01:00:00 via INTRAVENOUS

## 2012-02-27 MED ORDER — OXYCODONE HCL 5 MG PO TABS
5.0000 mg | ORAL_TABLET | ORAL | Status: DC | PRN
  Administered 2012-02-28: 5 mg via ORAL
  Filled 2012-02-27: qty 1

## 2012-02-27 MED ORDER — ONDANSETRON HCL 4 MG PO TABS: 4.0000 mg | ORAL_TABLET | Freq: Four times a day (QID) | ORAL | Status: DC | PRN

## 2012-02-27 MED ORDER — VENLAFAXINE HCL ER 150 MG PO CP24
150.0000 mg | ORAL_CAPSULE | Freq: Every day | ORAL | Status: DC
  Administered 2012-02-28 – 2012-03-02 (×4): 150 mg via ORAL
  Filled 2012-02-27 (×5): qty 1

## 2012-02-27 MED ORDER — AZELASTINE HCL 0.1 % NA SOLN
1.0000 | Freq: Two times a day (BID) | NASAL | Status: DC
  Administered 2012-02-27 – 2012-03-02 (×7): 1 via NASAL
  Filled 2012-02-27: qty 30

## 2012-02-27 MED ORDER — DEXTROSE 5 % IV SOLN
1.0000 g | Freq: Three times a day (TID) | INTRAVENOUS | Status: DC
  Administered 2012-02-27 – 2012-02-29 (×5): 1 g via INTRAVENOUS
  Filled 2012-02-27 (×8): qty 1

## 2012-02-27 MED ORDER — KETOTIFEN FUMARATE 0.025 % OP SOLN: 1.0000 [drp] | Freq: Two times a day (BID) | OPHTHALMIC | Status: DC

## 2012-02-27 MED ORDER — SODIUM CHLORIDE 0.9 % IJ SOLN
3.0000 mL | Freq: Two times a day (BID) | INTRAMUSCULAR | Status: DC
  Administered 2012-02-27 – 2012-03-02 (×4): 3 mL via INTRAVENOUS

## 2012-02-27 MED ORDER — ENOXAPARIN SODIUM 40 MG/0.4ML ~~LOC~~ SOLN
40.0000 mg | Freq: Every day | SUBCUTANEOUS | Status: DC
  Administered 2012-02-27 – 2012-03-01 (×4): 40 mg via SUBCUTANEOUS
  Filled 2012-02-27 (×5): qty 0.4

## 2012-02-27 MED ORDER — LEVETIRACETAM 250 MG PO TABS
250.0000 mg | ORAL_TABLET | Freq: Two times a day (BID) | ORAL | Status: DC
  Administered 2012-02-27 – 2012-03-02 (×8): 250 mg via ORAL
  Filled 2012-02-27 (×9): qty 1

## 2012-02-27 MED ORDER — ONDANSETRON HCL 4 MG/2ML IJ SOLN: 4.0000 mg | Freq: Four times a day (QID) | INTRAMUSCULAR | Status: DC | PRN

## 2012-02-27 MED ORDER — ACETAMINOPHEN 650 MG RE SUPP: 650.0000 mg | Freq: Four times a day (QID) | RECTAL | Status: DC | PRN

## 2012-02-27 MED ORDER — SODIUM CHLORIDE 0.9 % IV SOLN
1250.0000 mg | Freq: Two times a day (BID) | INTRAVENOUS | Status: DC
  Administered 2012-02-27 – 2012-02-28 (×2): 1250 mg via INTRAVENOUS
  Filled 2012-02-27 (×3): qty 1250

## 2012-02-27 MED ORDER — LEVALBUTEROL HCL 0.63 MG/3ML IN NEBU
0.6300 mg | INHALATION_SOLUTION | Freq: Four times a day (QID) | RESPIRATORY_TRACT | Status: DC
  Administered 2012-02-27: 0.63 mg via RESPIRATORY_TRACT
  Filled 2012-02-27 (×3): qty 3

## 2012-02-27 MED ORDER — LEVOTHYROXINE SODIUM 25 MCG PO TABS
25.0000 ug | ORAL_TABLET | Freq: Every day | ORAL | Status: DC
  Administered 2012-02-28 – 2012-03-02 (×4): 25 ug via ORAL
  Filled 2012-02-27 (×5): qty 1

## 2012-02-27 MED ORDER — LAMOTRIGINE 100 MG PO TABS
100.0000 mg | ORAL_TABLET | Freq: Two times a day (BID) | ORAL | Status: DC
  Administered 2012-02-27 – 2012-03-02 (×8): 100 mg via ORAL
  Filled 2012-02-27 (×9): qty 1

## 2012-02-27 MED ORDER — LORATADINE 10 MG PO TABS
10.0000 mg | ORAL_TABLET | Freq: Every day | ORAL | Status: DC
  Administered 2012-02-27 – 2012-03-02 (×5): 10 mg via ORAL
  Filled 2012-02-27 (×5): qty 1

## 2012-02-27 MED ORDER — FAMOTIDINE 10 MG PO TABS
10.0000 mg | ORAL_TABLET | Freq: Two times a day (BID) | ORAL | Status: DC
  Administered 2012-02-27 – 2012-03-02 (×8): 10 mg via ORAL
  Filled 2012-02-27 (×9): qty 1

## 2012-02-27 MED ORDER — ACETAMINOPHEN 325 MG PO TABS
650.0000 mg | ORAL_TABLET | Freq: Four times a day (QID) | ORAL | Status: DC | PRN
Start: 2012-02-27 — End: 2012-03-02

## 2012-02-27 MED ORDER — BACLOFEN 10 MG PO TABS
10.0000 mg | ORAL_TABLET | Freq: Four times a day (QID) | ORAL | Status: DC
  Administered 2012-02-27 – 2012-03-02 (×14): 10 mg via ORAL
  Filled 2012-02-27 (×17): qty 1

## 2012-02-27 MED ORDER — LEVOFLOXACIN IN D5W 750 MG/150ML IV SOLN
750.0000 mg | INTRAVENOUS | Status: DC
  Administered 2012-02-27 – 2012-02-28 (×2): 750 mg via INTRAVENOUS
  Filled 2012-02-27 (×3): qty 150

## 2012-02-27 NOTE — ED Notes (Signed)
Attempted to call report-RN to call me back.
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
## 2012-02-27 NOTE — ED Notes (Signed)
Called report to [phone].

## 2012-02-27 NOTE — Progress Notes (Signed)
ANTIBIOTIC CONSULT NOTE - INITIAL  Pharmacy Consult for vancomycin Indication: pneumonia  Allergies  Allergen Reactions  . Sulfa Antibiotics Hives  . Tape Rash    Paper tape    Patient Measurements:   Adjusted Body Weight:   Vital Signs: Temp: 98.7 F (37.1 C) (12/26 1317) Temp src: Oral (12/26 1317) BP: 126/63 mmHg (12/26 1317) Pulse Rate: 108  (12/26 1317) Intake/Output from previous day:   Intake/Output from this shift:    Labs:  Basename 02/27/12 1416 02/27/12 1358  WBC -- 9.3  HGB 13.9 13.4  PLT -- 173  LABCREA -- --  CREATININE 0.70 --   The CrCl is unknown because both a height and weight (above a minimum accepted value) are required for this calculation. No results found for this basename: VANCOTROUGH:2,VANCOPEAK:2,VANCORANDOM:2,GENTTROUGH:2,GENTPEAK:2,GENTRANDOM:2,TOBRATROUGH:2,TOBRAPEAK:2,TOBRARND:2,AMIKACINPEAK:2,AMIKACINTROU:2,AMIKACIN:2, in the last 72 hours   Microbiology: Recent Results (from the past 720 hour(s))  URINE CULTURE     Status: Normal   Collection Time   02/14/12  7:43 PM      Component Value Range Status Comment   Specimen Description URINE, CATHETERIZED   Final    Special Requests NONE   Final    Culture  Setup Time 02/15/2012 03:25   Final    Colony Count >=100,000 COLONIES/ML   Final    Culture     Final    Value: ESCHERICHIA COLI     Note: Confirmed Extended Spectrum Beta-Lactamase Producer (ESBL) CRITICAL RESULT CALLED TO, READ BACK BY AND VERIFIED WITH: REGINA M @1630  ON [id number] BY BUONO   Report Status 02/17/2012 FINAL   Final    Organism ID, Bacteria ESCHERICHIA COLI   Final     Medical History: Past Medical History  Diagnosis Date  . Sepsis(995.91)   . Hypertension   . DVT (deep vein thrombosis) in pregnancy   . Depression   . Chronic pain syndrome   . Allergic rhinitis   . Acute respiratory failure   . Epilepsy   . Migraine   . Chronic airway obstruction   . Cerebral palsy   . Dysphagia   . Hiatal hernia     . Morbid obesity 09-25-11    requires Hoyer lift. pt. doesn't stand or ambulate.  . Incontinent of urine 09-25-11    hx.  . Incontinent of feces 09-25-11    hx.  . Vitamin D deficiency   . Sepsis(995.91)     hx of   . Hyperlipidemia   . Mild intellectual disabilities   . Neurogenic bladder   . Hypothyroidism   . Anxiety   . Unspecified psychosis   . Muscle weakness   . Dysphasia   . Pneumonia     hx of asp pneumonia 1/11-1/19/12  . Sacral decubitus ulcer     hx of    Medications:  Scheduled:   Infusions:    . ceFEPime (MAXIPIME) IV    . levofloxacin (LEVAQUIN) IV     Assessment: 46 yo female with pneumonia will be put on vancomycin in addition to cefepime and levofloxacin.  CrCl ~80  12/26 Vancomycin >> 12/26 Cefepime >> 01/03 12/26 Levofloxacin >> 12/29  Goal of Therapy:  Vancomycin trough level 15-20 mcg/ml  Plan:  1) Vancomycin 1250mg  iv q12h 2) Check vancomycin trough when it's appropriate.  Ivianna Notch, Tsz-Yin 02/27/2012,3:10 PM
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
## 2012-02-27 NOTE — ED Provider Notes (Signed)
History     CSN: [id number]  Arrival date & time 02/27/12  1301   First MD Initiated Contact with Patient 02/27/12 1335      Chief Complaint  Patient presents with  . Chest Pain    (Consider location/radiation/quality/duration/timing/severity/associated sxs/prior treatment) HPI 46 year old female with past medical history significant for cerebral palsy, COPD requiring home oxygen, unspecified psychologic disorders presents to the emergency department today with chief complaint of chest pain and shortness of breath.  Patient called police earlier today requesting transfer from her current nursing home facility.  She then began complaining of chest pain and was transferred via EMS.  The patient is somewhat somnolent and does not really answer questions.  She denies any fevers or chills.  The patient denies any crushing chest pain or radiation into the left arm.  She does have numbness and tingling in all 4 extremities.  When asked about her chest pain the only thing she states this can I please see a Child psychotherapist. Past Medical History  Diagnosis Date  . Sepsis(995.91)   . Hypertension   . DVT (deep vein thrombosis) in pregnancy   . Depression   . Chronic pain syndrome   . Allergic rhinitis   . Acute respiratory failure   . Epilepsy   . Migraine   . Chronic airway obstruction   . Cerebral palsy   . Dysphagia   . Hiatal hernia   . Morbid obesity 09-25-11    requires Hoyer lift. pt. doesn't stand or ambulate.  . Incontinent of urine 09-25-11    hx.  . Incontinent of feces 09-25-11    hx.  . Vitamin D deficiency   . Sepsis(995.91)     hx of   . Hyperlipidemia   . Mild intellectual disabilities   . Neurogenic bladder   . Hypothyroidism   . Anxiety   . Unspecified psychosis   . Muscle weakness   . Dysphasia   . Pneumonia     hx of asp pneumonia 1/11-1/19/12  . Sacral decubitus ulcer     hx of    Past Surgical History  Procedure Date  . Nephrolithotomy 10/07/2011   Procedure: NEPHROLITHOTOMY PERCUTANEOUS;  Surgeon: Marcine Matar, MD;  Location: [facility];  Service: Urology;  Laterality: Right;  kidney   . Cystoscopy with ureteroscopy 11/07/2011    Procedure: CYSTOSCOPY WITH URETEROSCOPY;  Surgeon: Marcine Matar, MD;  Location: [facility];  Service: Urology;  Laterality: Right;  Removal of right double J stent, Insertion right double J stent  . Stone extraction with basket 11/07/2011    Procedure: STONE EXTRACTION WITH BASKET;  Surgeon: Marcine Matar, MD;  Location: [facility];  Service: Urology;  Laterality: Right;  . Nephrolithotomy 12/06/2011    Procedure: NEPHROLITHOTOMY PERCUTANEOUS;  Surgeon: Marcine Matar, MD;  Location: [facility];  Service: Urology;  Laterality: Right;   REPEAT PCNL     History reviewed. No pertinent family history.  History  Substance Use Topics  . Smoking status: Never Smoker   . Smokeless tobacco: Not on file  . Alcohol Use: No    OB History    Grav Para Term Preterm Abortions TAB SAB Ect Mult Living                  Review of Systems  Unable to perform ROS: Psychiatric disorder    Allergies  Sulfa antibiotics and Tape  Home Medications   Current Outpatient Rx  Name  Route  Sig  Dispense  Refill  .  ACETAMINOPHEN 500 MG PO TABS   Oral   Take 1,000 mg by mouth 2 (two) times daily.         . ASPIRIN EC 81 MG PO TBEC   Oral   Take 81 mg by mouth daily.         . AZELASTINE HCL 137 MCG/SPRAY NA SOLN   Nasal   Place 1 spray into the nose 2 (two) times daily. Use in each nostril as directed         . BACLOFEN 10 MG PO TABS   Oral   Take 10 mg by mouth 4 (four) times daily.          . BUDESONIDE-FORMOTEROL FUMARATE 160-4.5 MCG/ACT IN AERO   Inhalation   Inhale 1 puff into the lungs 2 (two) times daily.         Marland Kitchen VITAMIN D 400 UNITS PO CAPS   Oral   Take 400 Units by mouth every morning.          Marland Kitchen CITALOPRAM HYDROBROMIDE 40 MG PO TABS   Oral   Take 40 mg by mouth at bedtime.           Marland Kitchen CLONAZEPAM 1 MG PO TABS   Oral   Take 1 mg by mouth 2 (two) times daily.          Marland Kitchen FEXOFENADINE-PSEUDOEPHED ER 180-240 MG PO TB24   Oral   Take 1 tablet by mouth every morning.         Marland Kitchen HYDROCODONE-ACETAMINOPHEN 10-325 MG PO TABS   Oral   Take 1 tablet by mouth at bedtime as needed. For pain         . LAMOTRIGINE 100 MG PO TABS   Oral   Take 100 mg by mouth 2 (two) times daily.          Marland Kitchen LEVETIRACETAM 250 MG PO TABS   Oral   Take 250 mg by mouth every 12 (twelve) hours.         Marland Kitchen LEVOTHYROXINE SODIUM 25 MCG PO TABS   Oral   Take 25 mcg by mouth daily before breakfast.          . LORAZEPAM 1 MG PO TABS   Oral   Take 1 mg by mouth every 4 (four) hours as needed. For agitation         . MONTELUKAST SODIUM 10 MG PO TABS   Oral   Take 10 mg by mouth every morning.          . CERTA-VITE PO   Oral   Take 1 tablet by mouth daily.         Marland Kitchen PRAVASTATIN SODIUM 40 MG PO TABS   Oral   Take 40 mg by mouth daily.          Marland Kitchen PROPYLENE GLYCOL 0.6 % OP SOLN   Ophthalmic   Apply 1 drop to eye 4 (four) times daily.         Marland Kitchen RANITIDINE HCL 150 MG PO TABS   Oral   Take 150 mg by mouth 2 (two) times daily.         Marland Kitchen RISPERIDONE 2 MG PO TABS   Oral   Take 2 mg by mouth 2 (two) times daily.         Marland Kitchen RISPERIDONE MICROSPHERES 25 MG IM SUSR   Intramuscular   Inject 25 mg into the muscle every 14 (fourteen) days.         Marland Kitchen  SODIUM CHLORIDE 0.65 % NA SOLN   Nasal   Place 1 spray into the nose 2 (two) times daily.         . VENLAFAXINE HCL ER 150 MG PO CP24   Oral   Take 150 mg by mouth daily with breakfast.         . VITAMIN C 500 MG PO TABS   Oral   Take 500 mg by mouth 2 (two) times daily.         Marland Kitchen KETOTIFEN FUMARATE 0.025 % OP SOLN   Both Eyes   Place 1 drop into both eyes 2 (two) times daily.           BP 126/63  Pulse 108  Temp 98.7 F (37.1 C) (Oral)  Resp 16  SpO2 96%  LMP 02/04/2012  Physical Exam  Nursing note  and vitals reviewed. Constitutional: No distress.       Morbidly obese chronically ill appearing female.  She is somnolent.  HENT:  Head: Normocephalic and atraumatic.  Eyes: Conjunctivae normal are normal. Right eye exhibits no discharge. Left eye exhibits no discharge. No scleral icterus.       Exotropia of the eyes.   Neck: Normal range of motion.  Cardiovascular: Regular rhythm and normal heart sounds.  Exam reveals no gallop and no friction rub.   No murmur heard.      Tachycardic   Pulmonary/Chest: Effort normal and breath sounds normal. No respiratory distress. She has no wheezes.  Abdominal: Soft. Bowel sounds are normal. She exhibits no distension. There is no tenderness. There is no guarding.       Obese, protuberant abdomen  Neurological:       Somnolent. Will not follow commands.  Repeatedly demands a Child psychotherapist and asks to call her brother.  Skin: Skin is warm and dry.    ED Course  Procedures (including critical care time)  Labs Reviewed  CBC - Abnormal; Notable for the following:    RDW 15.7 (*)     All other components within normal limits  POCT I-STAT, CHEM 8 - Abnormal; Notable for the following:    Calcium, Ion 1.26 (*)     All other components within normal limits  POCT I-STAT TROPONIN I   Dg Chest 1 View  02/27/2012  *RADIOLOGY REPORT*  Clinical Data: Chest pain and weakness.  CHEST - 1 VIEW  Comparison: Two-view chest 02/14/2012.  Findings: Pulmonary vascular congestion has increased.  The lung volumes are low.  Left pleural effusion is present.  Asymmetric left basilar airspace disease is noted.  The visualized soft tissues and bony thorax are unremarkable.  IMPRESSION:  1.  Increased pulmonary vascular congestion. 2.  New left pleural effusion. 3.  Left basilar airspace disease could represent atelectasis, but is suspicious for pneumonia.   Original Report Authenticated By: Marin Roberts, M.D.      1. HCAP (healthcare-associated pneumonia)        MDM   Filed Vitals:   02/27/12 1317  BP: 126/63  Pulse: 108  Temp: 98.7 F (37.1 C)  Resp: 16    Patient with tachycardia. Troponin is negative o2 sats normal on 2 L. Xray suspicious for pneumonia.  I have begun treatment for HCAP.  Dr. Lynford Citizen has agreed to admit the patient.        Arthor Captain, PA-C 02/27/12 [phone]
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
## 2012-02-27 NOTE — Progress Notes (Signed)
NURSING PROGRESS NOTE  Kristi Perry [id number] Admission Data: 02/27/2012 7:40 PM Attending Provider: Richarda Overlie, MD WUJ:[id number],FAOZHY, MD Code Status: full   Kristi Perry is a 46 y.o. female patient admitted from ED  No acute distress noted.  No c/o shortness of breath, no c/o chest pain.  Cardiac tele # [phone], in place, cardiac monitor yields:normal sinus rhythm.  Blood pressure 120/71, pulse 89, temperature 98.6 F (37 C), temperature source Oral, resp. rate 16, last menstrual period 02/04/2012, SpO2 99.00%.   IV Fluids:  IV in place, occlusive dsg intact without redness, IV cath antecubital left, condition patent and no redness normal saline.   Allergies:  Sulfa antibiotics and Tape  Past Medical History:   has a past medical history of Sepsis(995.91); Hypertension; DVT (deep vein thrombosis) in pregnancy; Depression; Chronic pain syndrome; Allergic rhinitis; Acute respiratory failure; Epilepsy; Migraine; Chronic airway obstruction; Cerebral palsy; Dysphagia; Hiatal hernia; Morbid obesity (09-25-11); Incontinent of urine (09-25-11); Incontinent of feces (09-25-11); Vitamin D deficiency; Sepsis(995.91); Hyperlipidemia; Mild intellectual disabilities; Neurogenic bladder; Hypothyroidism; Anxiety; Unspecified psychosis; Muscle weakness; Dysphasia; Pneumonia; and Sacral decubitus ulcer.  Past Surgical History:   has past surgical history that includes Nephrolithotomy (10/07/2011); Cystoscopy with ureteroscopy (11/07/2011); Stone extraction with basket (11/07/2011); and Nephrolithotomy (12/06/2011).  Social History:   reports that she has never smoked. She does not have any smokeless tobacco history on file. She reports that she does not drink alcohol or use illicit drugs.  Skin: pressure ulcers to sacrum, see documentation.   Orientation to room, and floor completed with information packet given to patient/family. Admission INP armband ID verified with patient/family, and in place.   SR up  x 2, fall assessment complete, with patient and family able to verbalize understanding of risk associated with falls, and verbalized understanding to call for assistance before getting out of bed.   Call light within reach. Patient able to voice and demonstrate understanding of unit orientation instructions.   Will cont to eval and treat per MD orders.  Derek Huneycutt, Elmarie Mainland, RN

## 2012-02-28 DIAGNOSIS — R072 Precordial pain: Secondary | ICD-10-CM

## 2012-02-28 DIAGNOSIS — G809 Cerebral palsy, unspecified: Secondary | ICD-10-CM

## 2012-02-28 DIAGNOSIS — F39 Unspecified mood [affective] disorder: Secondary | ICD-10-CM

## 2012-02-28 DIAGNOSIS — F3289 Other specified depressive episodes: Secondary | ICD-10-CM

## 2012-02-28 DIAGNOSIS — F329 Major depressive disorder, single episode, unspecified: Secondary | ICD-10-CM

## 2012-02-28 DIAGNOSIS — I10 Essential (primary) hypertension: Secondary | ICD-10-CM

## 2012-02-28 DIAGNOSIS — J189 Pneumonia, unspecified organism: Principal | ICD-10-CM

## 2012-02-28 DIAGNOSIS — R079 Chest pain, unspecified: Secondary | ICD-10-CM

## 2012-02-28 LAB — CBC
Hemoglobin: 13.1 g/dL (ref 12.0–15.0)
MCH: 28.9 pg (ref 26.0–34.0)
MCHC: 32.3 g/dL (ref 30.0–36.0)
Platelets: 173 10*3/uL (ref 150–400)
RBC: 4.53 MIL/uL (ref 3.87–5.11)

## 2012-02-28 LAB — COMPREHENSIVE METABOLIC PANEL
ALT: 31 U/L (ref 0–35)
AST: 19 U/L (ref 0–37)
Alkaline Phosphatase: 52 U/L (ref 39–117)
CO2: 24 mEq/L (ref 19–32)
Calcium: 9.1 mg/dL (ref 8.4–10.5)
GFR calc Af Amer: >90 mL/min (ref >90)
Glucose, Bld: 93 mg/dL (ref 70–99)
Potassium: 3.7 mEq/L (ref 3.5–5.1)
Sodium: 138 mEq/L (ref 135–145)
Total Protein: 6.5 g/dL (ref 6.0–8.3)

## 2012-02-28 MED ORDER — CHLORHEXIDINE GLUCONATE CLOTH 2 % EX PADS: 6.0000 | MEDICATED_PAD | Freq: Every day | CUTANEOUS | Status: DC

## 2012-02-28 MED ORDER — MUPIROCIN 2 % EX OINT: 1.0000 "application " | TOPICAL_OINTMENT | Freq: Two times a day (BID) | CUTANEOUS | Status: DC

## 2012-02-28 MED ORDER — IPRATROPIUM BROMIDE 0.02 % IN SOLN
0.5000 mg | Freq: Two times a day (BID) | RESPIRATORY_TRACT | Status: DC
  Administered 2012-02-28 – 2012-03-01 (×6): 0.5 mg via RESPIRATORY_TRACT
  Filled 2012-02-28 (×6): qty 2.5

## 2012-02-28 MED ORDER — CHLORHEXIDINE GLUCONATE CLOTH 2 % EX PADS
6.0000 | MEDICATED_PAD | Freq: Every day | CUTANEOUS | Status: DC
  Administered 2012-02-28 – 2012-03-02 (×4): 6 via TOPICAL

## 2012-02-28 MED ORDER — LORAZEPAM 2 MG/ML IJ SOLN
0.5000 mg | Freq: Once | INTRAMUSCULAR | Status: DC
  Filled 2012-02-28: qty 1

## 2012-02-28 MED ORDER — VANCOMYCIN HCL IN DEXTROSE 1-5 GM/200ML-% IV SOLN
1000.0000 mg | Freq: Three times a day (TID) | INTRAVENOUS | Status: DC
  Administered 2012-02-28 – 2012-02-29 (×3): 1000 mg via INTRAVENOUS
  Filled 2012-02-28 (×5): qty 200

## 2012-02-28 MED ORDER — SENNA 8.6 MG PO TABS
2.0000 | ORAL_TABLET | Freq: Every day | ORAL | Status: DC
  Administered 2012-02-28 – 2012-03-01 (×2): 17.2 mg via ORAL
  Filled 2012-02-28 (×4): qty 2

## 2012-02-28 MED ORDER — POLYETHYLENE GLYCOL 3350 17 G PO PACK
17.0000 g | PACK | Freq: Two times a day (BID) | ORAL | Status: DC
  Administered 2012-02-28 – 2012-03-02 (×5): 17 g via ORAL
  Filled 2012-02-28 (×8): qty 1

## 2012-02-28 MED ORDER — MUPIROCIN 2 % EX OINT
1.0000 "application " | TOPICAL_OINTMENT | Freq: Two times a day (BID) | CUTANEOUS | Status: DC
  Administered 2012-02-28 – 2012-03-02 (×7): 1 via NASAL
  Filled 2012-02-28: qty 22

## 2012-02-28 MED ORDER — OXYCODONE HCL 5 MG PO TABS
5.0000 mg | ORAL_TABLET | ORAL | Status: DC | PRN
  Administered 2012-02-28 – 2012-02-29 (×2): 10 mg via ORAL
  Filled 2012-02-28 (×2): qty 2

## 2012-02-28 MED ORDER — LEVALBUTEROL HCL 0.63 MG/3ML IN NEBU
0.6300 mg | INHALATION_SOLUTION | Freq: Two times a day (BID) | RESPIRATORY_TRACT | Status: DC
  Administered 2012-02-28 – 2012-03-01 (×6): 0.63 mg via RESPIRATORY_TRACT
  Filled 2012-02-28 (×9): qty 3

## 2012-02-28 MED ORDER — MORPHINE SULFATE 2 MG/ML IJ SOLN
1.0000 mg | INTRAMUSCULAR | Status: DC | PRN
  Administered 2012-02-28 – 2012-03-02 (×9): 1 mg via INTRAVENOUS
  Filled 2012-02-28 (×8): qty 1

## 2012-02-28 NOTE — Consult Note (Signed)
WOC consult Note Reason for Consult: bilateral buttock wounds, reports that she is bedbound, but is up in wheelchair at SNF much of the time.  Wound type:Stage II pressure ulcers bilateral buttocks with associated sheer injury as well.   Pressure Ulcer POA: Yes x 2  Measurement: Left buttock: 0.5cm x 1.5cm x 0 Right buttock: 6cm x 5cm x 0.2cm  Wound bed: Left buttock: pink, moist, no necrotic tissue Right buttock: moist and pink but with some shaggy skin throughout wound bed (sheer injury) Drainage (amount, consistency, odor) none Periwound:some maceration of the right buttock periwound, but she wears diapers at the facility which may contribute to the maceration of the buttocks Dressing procedure/placement/frequency:continue silicone foam dressing to the wounds, add air mattress for pressure redistribution.  Re consult if needed, will not follow at this time. Thanks  Denaly Gatling [employer], CWOCN [phone])
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
## 2012-02-28 NOTE — Clinical Social Work Placement (Addendum)
     Clinical Social Work Department CLINICAL SOCIAL WORK PLACEMENT NOTE 03/02/2012  Patient:  Kristi Perry, Kristi Perry  Account Number:  [id number] Admit date:  02/27/2012  Clinical Social Worker:  Robin Searing  Date/time:  02/28/2012 11:36 AM  Clinical Social Work is seeking post-discharge placement for this patient at the following level of care:   SKILLED NURSING   (*CSW will update this form in Epic as items are completed)   02/28/2012  Patient/family provided with [facility] Department of Clinical Social Works list of facilities offering this level of care within the geographic area requested by the patient (or if unable, by the patients family).  02/28/2012  Patient/family informed of their freedom to choose among providers that offer the needed level of care, that participate in Medicare, Medicaid or managed care program needed by the patient, have an available bed and are willing to accept the patient.  02/28/2012  Patient/family informed of [facility] ownership interest in [facility], as well as of the fact that they are under no obligation to receive care at this facility.  PASARR submitted to EDS on 02/28/2012 PASARR number received from EDS on 02/28/2012  FL2 transmitted to all facilities in geographic area requested by pt/family on  02/28/2012 FL2 transmitted to all facilities within larger geographic area on   Patient informed that his/her managed care company has contracts with or will negotiate with  certain facilities, including the following:     Patient/family informed of bed offers received:  03/02/2012 Patient chooses bed at [facility] Physician recommends and patient chooses bed at    Patient to be transferred to [facility] on  03/02/2012 Patient to be transferred to facility by   The following physician request were entered in Epic:   Additional Comments:

## 2012-02-28 NOTE — Progress Notes (Signed)
Patient admitted from [facility]- states that she has been there for @3  years- she is requesting to seek placement closer to her mother in [place]- I have told her we can fax out to area SNF's and see if options are available closer to mom- Will complete FL2 and begin SNF search.    Reece Levy, MSW, [facility] [phone]

## 2012-02-28 NOTE — Progress Notes (Signed)
[facility] hospitalist progress note. Chief complaint. Chest pain. History of present illness. This 46 year old female nursing home resident complained of 10/10 chest pain at the nursing home and was brought to hospital and admitted. She had been seen by Dr. Elease Hashimoto cardiology 11/13 felt the patient was not a good candidate for invasive for investigational procedures and has been treated medically. Patient complained of recurrent chest pain to the nursing staff and a 12-lead EKG was obtained. This EKG indicates sinus tachycardia with no indication of ischemia seen. Oddly the patient did not complain of any chest pain when I was at the bedside. Instead she complained of hand pain and repeatedly indicated she wanted a therapy consult for braces for her hands. Also she complains of long-term headaches and requests a CT scan of the head. She's had 2 sets of troponins so far this hospitalization and these have been negative. She is a third set to later this a.m. Vital signs. Temperature 98.2, pulse 94, respiration 18, blood pressure 137/71. O2 sats 95%. General appearance. Obese middle-aged female who is alert but intermittently tearful describing her hand pain and chronic headaches. Cardiac. Rate and rhythm regular. Lungs. Breath sounds clear and equal. Abdomen. Soft with positive bowel sounds. Musculoskeletal. No pain with palpation over the sternal area. Impression/plan. Problem #1. Chest pain. Given negative troponins to this point and lack of EKG changes currently I suspect patient's chest pain is not cardiac in origin. I will follow for the next troponin which is due at about 0 600 this a.m. The patient might benefit from a physical therapy consult to consider bracing her hands to prevent flexion contractures but I will defer this to the attending physician.
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
## 2012-02-28 NOTE — Progress Notes (Signed)
  Echocardiogram 2D Echocardiogram has been performed.  Kristi Perry FRANCES 02/28/2012, 1:02 PM

## 2012-02-28 NOTE — Progress Notes (Signed)
Pt reported that she was having chest pains, did EKG- NSR with sinus tach. Called doctor. Doctor will be on floor to see pt within a little while. Will continue to monitor.

## 2012-02-28 NOTE — Progress Notes (Signed)
Called patient's mother to clarify DNR code. Mother replied that patient is indeed a DNR.

## 2012-02-28 NOTE — Progress Notes (Signed)
02/27/12 1843  Clinical Encounter Type  Visited With Patient  Visit Type Initial  Advance Directives (For Healthcare)  Advance Directive Patient does not have advance directive  Pre-existing out of facility DNR order (yellow form or pink MOST form) No   Visited patient briefly.  Veryl Speak

## 2012-02-28 NOTE — Progress Notes (Signed)
ANTIBIOTIC CONSULT NOTE - FOLLOW UP  Pharmacy Consult for Vancomycin Indication: pneumonia  Allergies  Allergen Reactions  . Sulfa Antibiotics Hives  . Tape Rash    Paper tape    Patient Measurements: Height: 5\' 3"  (160 cm) Weight: 214 lb 4.6 oz (97.2 kg) IBW/kg (Calculated) : 52.4   Vital Signs: Temp: 98.3 F (36.8 C) (12/27 0419) Temp src: Oral (12/27 0419) BP: 125/78 mmHg (12/27 0419) Pulse Rate: 96  (12/27 0419) Intake/Output from previous day:   Intake/Output from this shift:    Labs:  Basename 02/28/12 0900 02/27/12 1847 02/27/12 1416 02/27/12 1358  WBC 6.5 6.7 -- 9.3  HGB 13.1 12.6 13.9 --  PLT 173 165 -- 173  LABCREA -- -- -- --  CREATININE -- -- 0.70 --   Estimated Creatinine Clearance: 98.6 ml/min (by C-G formula based on Cr of 0.7). No results found for this basename: VANCOTROUGH:2,VANCOPEAK:2,VANCORANDOM:2,GENTTROUGH:2,GENTPEAK:2,GENTRANDOM:2,TOBRATROUGH:2,TOBRAPEAK:2,TOBRARND:2,AMIKACINPEAK:2,AMIKACINTROU:2,AMIKACIN:2, in the last 72 hours   Microbiology: Recent Results (from the past 720 hour(s))  URINE CULTURE     Status: Normal   Collection Time   02/14/12  7:43 PM      Component Value Range Status Comment   Specimen Description URINE, CATHETERIZED   Final    Special Requests NONE   Final    Culture  Setup Time 02/15/2012 03:25   Final    Colony Count >=100,000 COLONIES/ML   Final    Culture     Final    Value: ESCHERICHIA COLI     Note: Confirmed Extended Spectrum Beta-Lactamase Producer (ESBL) CRITICAL RESULT CALLED TO, READ BACK BY AND VERIFIED WITH: REGINA M @1630  ON [id number] BY BUONO   Report Status 02/17/2012 FINAL   Final    Organism ID, Bacteria ESCHERICHIA COLI   Final   MRSA PCR SCREENING     Status: Abnormal   Collection Time   02/27/12 10:09 PM      Component Value Range Status Comment   MRSA by PCR POSITIVE (*) NEGATIVE Final     Anti-infectives     Start     Dose/Rate Route Frequency Ordered Stop   02/28/12 1400    vancomycin (VANCOCIN) IVPB 1000 mg/200 mL premix        1,000 mg 200 mL/hr over 60 Minutes Intravenous Every 8 hours 02/28/12 1109 03/06/12 1359   02/27/12 1700   vancomycin (VANCOCIN) 1,250 mg in sodium chloride 0.9 % 250 mL IVPB  Status:  Discontinued        1,250 mg 166.7 mL/hr over 90 Minutes Intravenous Every 12 hours 02/27/12 1513 02/28/12 1109   02/27/12 1515   ceFEPIme (MAXIPIME) 1 g in dextrose 5 % 50 mL IVPB        1 g 100 mL/hr over 30 Minutes Intravenous 3 times per day 02/27/12 1503 03/06/12 1359   02/27/12 1515   levofloxacin (LEVAQUIN) IVPB 750 mg        750 mg 100 mL/hr over 90 Minutes Intravenous Every 24 hours 02/27/12 1503 03/01/12 1514          Assessment: Kristi Perry is a 46 year old female on vancomycin per pharmacy along with cefepime and levaquin for pneumonia. She is currently afebrile and wbc wnl. Her renal function has improved requiring dose adjustment of her vancomycin.   Goal of Therapy:  Vancomycin trough level 15-20 mcg/ml  Plan:  Increase vancomycin to 1000mg  IV q8h and place total 8 day stop date per pna protocol Obtain troughs when appropriate and continue to monitor renal  function  Thank you,  Brett Fairy, PharmD, BCPS 02/28/2012 11:12 AM

## 2012-02-28 NOTE — Progress Notes (Addendum)
Orthopedic Tech Progress Note Patient Details:  Kristi Perry 1965-10-10 [id number]  Ortho Devices Type of Ortho Device: Velcro wrist splint bi lateral   [name] 02/28/2012, 9:08 AM
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
## 2012-02-28 NOTE — Progress Notes (Signed)
Orthopedic Tech Progress Note Patient Details:  Kristi Perry [birth year] [id number]  Ortho Devices Type of Ortho Device: Velcro wrist splint   [name] 02/28/2012, 9:08 AM
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
## 2012-02-29 LAB — CBC
HCT: 40.6 % (ref 36.0–46.0)
Hemoglobin: 13.5 g/dL (ref 12.0–15.0)
MCHC: 33.3 g/dL (ref 30.0–36.0)
MCV: 88.6 fL (ref 78.0–100.0)
RDW: 15.8 % — ABNORMAL HIGH (ref 11.5–15.5)

## 2012-02-29 LAB — BASIC METABOLIC PANEL
BUN: 10 mg/dL (ref 6–23)
Chloride: 102 mEq/L (ref 96–112)
Creatinine, Ser: 0.54 mg/dL (ref 0.50–1.10)
GFR calc Af Amer: >90 mL/min (ref >90)
GFR calc non Af Amer: >90 mL/min (ref >90)
Glucose, Bld: 100 mg/dL — ABNORMAL HIGH (ref 70–99)
Potassium: 3.8 mEq/L (ref 3.5–5.1)

## 2012-02-29 MED ORDER — LEVOFLOXACIN 750 MG PO TABS
750.0000 mg | ORAL_TABLET | Freq: Every day | ORAL | Status: DC
  Administered 2012-02-29 – 2012-03-02 (×3): 750 mg via ORAL
  Filled 2012-02-29 (×3): qty 1

## 2012-02-29 NOTE — Progress Notes (Signed)
     Subjective: Patient was admitted with chest pain and was found to have possible pneumonia although she did not really have any symptoms of pneumonia. She is being treated with triple intravenous antibiotics for healthcare acquired pneumonia. She has a headache and requests a CT head scan.           Physical Exam: Blood pressure 108/64, pulse 88, temperature 98.8 F (37.1 C), temperature source Oral, resp. rate 18, height 5\' 3"  (1.6 m), weight 97.2 kg (214 lb 4.6 oz), last menstrual period 02/04/2012, SpO2 98.00%. She looks systemically well. She is not toxic or septic. Lung fields are clear anteriorly. She is unable to sit up for me to examine her posteriorly. Heart sounds are present without gallop rhythm. She has no evidence of heart failure. She is alert and orientated. There are no focal neurological signs that are needed.   Investigations:  Recent Results (from the past 240 hour(s))  MRSA PCR SCREENING     Status: Abnormal   Collection Time   02/27/12 10:09 PM      Component Value Range Status Comment   MRSA by PCR POSITIVE (*) NEGATIVE Final      Basic Metabolic Panel:  Basename 02/29/12 0536 02/28/12 0900  NA 138 138  K 3.8 3.7  CL 102 102  CO2 24 24  GLUCOSE 100* 93  BUN 10 15  CREATININE 0.54 0.47*  CALCIUM 9.8 9.1  MG -- --  PHOS -- --   Liver Function Tests:  Basename 02/28/12 0900  AST 19  ALT 31  ALKPHOS 52  BILITOT 0.3  PROT 6.5  ALBUMIN 3.6     CBC:  Basename 02/29/12 0536 02/28/12 0900  WBC 6.8 6.5  NEUTROABS -- --  HGB 13.5 13.1  HCT 40.6 40.5  MCV 88.6 89.4  PLT 161 173    Dg Chest 1 View  02/27/2012  *RADIOLOGY REPORT*  Clinical Data: Chest pain and weakness.  CHEST - 1 VIEW  Comparison: Two-view chest 02/14/2012.  Findings: Pulmonary vascular congestion has increased.  The lung volumes are low.  Left pleural effusion is present.  Asymmetric left basilar airspace disease is noted.  The visualized soft tissues and bony  thorax are unremarkable.  IMPRESSION:  1.  Increased pulmonary vascular congestion. 2.  New left pleural effusion. 3.  Left basilar airspace disease could represent atelectasis, but is suspicious for pneumonia.   Original Report Authenticated By: Marin Roberts, M.D.       Medications: I have reviewed the patient's current medications.  Impression: 1. Healthcare associated pneumonia. 2. Chest pain, possibly related to #1. 3. Cerebral palsy in a bedbound state. 4. Morbid obesity. 5. Headache, with no clinical features that are worrisome.     Plan: 1. Modified antibiotics for healthcare associated pneumonia. I think she can be on oral Levaquin 750 mg daily now. Discontinue other antibiotics IV. 2. No need for CT brain scan. 3. Discharge planning. She can be discharged when placement has been found. She is medically ready for discharge at this point. I really do not believe psychiatric input will be of much use here but will defer to clinical social worker.     LOS: 2 days   Wilson Singer Pager [phone]  02/29/2012, 8:35 AM

## 2012-03-01 MED ORDER — LORAZEPAM 2 MG/ML IJ SOLN
0.5000 mg | Freq: Four times a day (QID) | INTRAMUSCULAR | Status: DC | PRN
  Administered 2012-03-01: 0.5 mg via INTRAVENOUS

## 2012-03-01 MED ORDER — LORAZEPAM 2 MG/ML IJ SOLN
0.5000 mg | Freq: Once | INTRAMUSCULAR | Status: AC
  Administered 2012-03-01: 0.5 mg via INTRAVENOUS
  Filled 2012-03-01: qty 1

## 2012-03-01 NOTE — ED Provider Notes (Signed)
Medical screening examination/treatment/procedure(s) were performed by non-physician practitioner and as supervising physician I was immediately available for consultation/collaboration.  Juliet Rude. Rubin Payor, MD 03/01/12 [phone]

## 2012-03-01 NOTE — Progress Notes (Signed)
[facility] PROGRESS NOTE  Kristi Perry HQI:[id number] DOB: [birth year] DOA: 02/27/2012 PCP: Karlene Einstein, MD  Assessment/Plan: HCAP (healthcare-associated pneumonia)  - From SNF- therefore healthcare associated pneumonia  - Remains afebrile and without leukocytosis  - change to levaquin    Chest pain  - Very atypical  - In any event, for outpatient cardiology note-not a candidate for further intervention  - Cardiac enzymes remain negative  - D-dimer negative as well   Hypothyroidism  - Continue with levothyroxine   Cerebral palsy  - Unfortunately pretty advanced, wheelchair bound mostly.Has contractures.  - Continue with baclofen   Depression/anxiety  - Apparently has been very difficult while at the facility -has been calling the police because she wanted to be moved to another facility  - Continue with Celexa, Klonopin, Risperdal, Effexor   Seizure disorder  - Continue with Keppra      Code Status: DNR Family Communication:  Disposition Plan: back to SNF on Monday   Consultants:    Procedures:    Antibiotics:    HPI/Subjective: Wanting a CT scan Wanting a new nursing home ;aying comfortably in bed  Objective: Filed Vitals:   02/29/12 2041 02/29/12 2256 03/01/12 0558 03/01/12 0803  BP: 171/80 131/84 130/57   Pulse: 97  97   Temp: 98.4 F (36.9 C)  98.6 F (37 C)   TempSrc: Oral  Oral   Resp:   20   Height:      Weight:      SpO2: 100%  97% 99%    Intake/Output Summary (Last 24 hours) at 03/01/12 1023 Last data filed at 03/01/12 1324  Gross per 24 hour  Intake    960 ml  Output      0 ml  Net    960 ml   Filed Weights   02/27/12 2003  Weight: 97.2 kg (214 lb 4.6 oz)    Exam:   General:  demanding  Cardiovascular: rrr  Respiratory: clear anterior  Abdomen: +BS, soft, NT.ND  Data Reviewed: Basic Metabolic Panel:  Lab 02/29/12 4010 02/28/12 0900 02/27/12 1416  NA 138 138 140  K 3.8 3.7 3.9  CL 102 102 104    CO2 24 24 --  GLUCOSE 100* 93 84  BUN 10 15 14   CREATININE 0.54 0.47* 0.70  CALCIUM 9.8 9.1 --  MG -- -- --  PHOS -- -- --   Liver Function Tests:  Lab 02/28/12 0900  AST 19  ALT 31  ALKPHOS 52  BILITOT 0.3  PROT 6.5  ALBUMIN 3.6   No results found for this basename: LIPASE:5,AMYLASE:5 in the last 168 hours No results found for this basename: AMMONIA:5 in the last 168 hours CBC:  Lab 02/29/12 0536 02/28/12 0900 02/27/12 1847 02/27/12 1416 02/27/12 1358  WBC 6.8 6.5 6.7 -- 9.3  NEUTROABS -- -- -- -- --  HGB 13.5 13.1 12.6 13.9 13.4  HCT 40.6 40.5 39.5 41.0 41.4  MCV 88.6 89.4 88.4 -- 88.3  PLT 161 173 165 -- 173   Cardiac Enzymes:  Lab 02/28/12 0900 02/28/12 0012 02/27/12 1846  CKTOTAL -- -- --  CKMB -- -- --  CKMBINDEX -- -- --  TROPONINI <0.30 <0.30 <0.30   BNP (last 3 results) No results found for this basename: PROBNP:3 in the last 8760 hours CBG: No results found for this basename: GLUCAP:5 in the last 168 hours  Recent Results (from the past 240 hour(s))  MRSA PCR SCREENING     Status: Abnormal  Collection Time   02/27/12 10:09 PM      Component Value Range Status Comment   MRSA by PCR POSITIVE (*) NEGATIVE Final      Studies: No results found.  Scheduled Meds:   . azelastine  1 spray Each Nare BID  . baclofen  10 mg Oral QID  . budesonide-formoterol  1 puff Inhalation BID  . Chlorhexidine Gluconate Cloth  6 each Topical Q0600  . citalopram  40 mg Oral QHS  . clonazePAM  1 mg Oral BID  . enoxaparin (LOVENOX) injection  40 mg Subcutaneous QHS  . famotidine  10 mg Oral BID  . ipratropium  0.5 mg Nebulization BID  . lamoTRIgine  100 mg Oral BID  . levalbuterol  0.63 mg Nebulization BID  . levETIRAcetam  250 mg Oral Q12H  . levofloxacin  750 mg Oral Q1500  . levothyroxine  25 mcg Oral QAC breakfast  . loratadine  10 mg Oral Daily  . LORazepam  0.5 mg Intravenous Once  . montelukast  10 mg Oral q morning - 10a  . mupirocin ointment  1  application Nasal BID  . olopatadine  1 drop Both Eyes BID  . polyethylene glycol  17 g Oral BID  . risperiDONE microspheres  25 mg Intramuscular Q14 Days  . senna  2 tablet Oral QHS  . sodium chloride  3 mL Intravenous Q12H  . venlafaxine XR  150 mg Oral Q breakfast   Continuous Infusions:   Principal Problem:  *HCAP (healthcare-associated pneumonia) Active Problems:  Chest pain  Hypertension  Cerebral palsy  Morbid obesity    Time spent: 35    [facility], Thai Hemrick  [facility] Pager [phone]. If 8PM-8AM, please contact night-coverage at [email or web address], password [facility] 03/01/2012, 10:23 AM  LOS: 3 days

## 2012-03-02 MED ORDER — LEVALBUTEROL HCL 0.63 MG/3ML IN NEBU
0.6300 mg | INHALATION_SOLUTION | Freq: Four times a day (QID) | RESPIRATORY_TRACT | Status: DC | PRN
  Filled 2012-03-02: qty 3

## 2012-03-02 MED ORDER — LORAZEPAM 1 MG PO TABS: 1.0000 mg | ORAL_TABLET | ORAL | Status: DC | PRN

## 2012-03-02 MED ORDER — IPRATROPIUM BROMIDE 0.02 % IN SOLN: 0.5000 mg | Freq: Four times a day (QID) | RESPIRATORY_TRACT | Status: DC | PRN

## 2012-03-02 MED ORDER — LEVOFLOXACIN 750 MG PO TABS: 750.0000 mg | ORAL_TABLET | Freq: Every day | ORAL | Status: DC

## 2012-03-02 MED ORDER — CLONAZEPAM 1 MG PO TABS: 1.0000 mg | ORAL_TABLET | Freq: Two times a day (BID) | ORAL | Status: DC

## 2012-03-02 MED ORDER — SENNA 8.6 MG PO TABS: 2.0000 | ORAL_TABLET | Freq: Every day | ORAL | Status: DC | PRN

## 2012-03-02 MED ORDER — HYDROCODONE-ACETAMINOPHEN 10-325 MG PO TABS: 1.0000 | ORAL_TABLET | Freq: Every evening | ORAL | Status: DC | PRN

## 2012-03-02 MED ORDER — LEVALBUTEROL HCL 0.63 MG/3ML IN NEBU: 0.6300 mg | INHALATION_SOLUTION | Freq: Three times a day (TID) | RESPIRATORY_TRACT | Status: DC | PRN

## 2012-03-02 MED ORDER — POLYETHYLENE GLYCOL 3350 17 G PO PACK: 17.0000 g | PACK | Freq: Two times a day (BID) | ORAL | Status: DC

## 2012-03-02 NOTE — Progress Notes (Signed)
Report called to [facility] at 1540. Patient to be  discharged via ambulance . Patient verbalized understanding of discharge instructions . Packet of discharge documentation given to transporter. Continue with plan of care.  Kristi Perry

## 2012-03-02 NOTE — Progress Notes (Signed)
SNF bed secured for patient at [facility] for today- patient excited and agreeable to this plan- patient's mother also aware and agreeable. Plan tx via EMS. Reece Levy, MSW, Theresia Majors [phone]

## 2012-03-02 NOTE — Plan of Care (Signed)
Problem: Discharge Progression Outcomes Goal: Barriers To Progression Addressed/Resolved Outcome: Completed/Met Date Met:  03/02/12 Patient accepted to go to [facility] .

## 2012-03-02 NOTE — Discharge Summary (Signed)
Physician Discharge Summary  Kristi Perry WUJ:[id number] DOB: 1965-04-12 DOA: 02/27/2012  PCP: Karlene Einstein, MD  Admit date: 02/27/2012 Discharge date: 03/02/2012  Time spent: 40  minutes  Recommendations for Outpatient Follow-up:  1. Be seen by PCP in 7 to 10 days to ensure resolution of symptoms and continuity of care.  Discharge Diagnoses:  Principal Problem:  *HCAP (healthcare-associated pneumonia) Active Problems:  Chest pain  Hypertension  Cerebral palsy  Morbid obesity   Discharge Condition: Stable, comfortable, eating well, no difficulty breathing.  Diet recommendation: regular diet  Filed Weights   02/27/12 2003  Weight: 97.2 kg (214 lb 4.6 oz)    History of present illness:  Chief Complaint:  Chest Pain 46 year old female nursing home resident, with history of severe cerebral palsy, arrived via [facility] with complaints of chest pain. EMS states that patient call the police because she wanted to be moved to another NH facility. When they couldn't help her she stated she was having chest pain and EMS was called. Patient arrived to room and immediately wanted [place] to call a social worker to arrange for her to be moved to a new nursing home. Patient states she does not want to go back to NH. She also states she is having 10/10 CP.   She has been complaining of chest pain for the past several months which she thinks is related to her asthma. Her chest hurts whenever she takes a deep breath and coughs. She has chronic pain. She's been seen in the emergency room on 2 occasions and has had a negative workup. Her troponin levels have been negative.  She saw Dr.Philip Earnstine Regal, MD, on 01/21/12. As per his assessment the patient's chest pain is atypical.  She has chronic pain syndrome and these chest pains are somewhat difficult to sort out but these sound atypical. She's not able to walk so I am unable to say that these are worsened with exertion. She'll have these episodes for hours  at a time. She has been seen in the emergency room on several occasions and has had a negative workup. Her troponin levels have been negative despite having had hours of chest pain.  She is wheelchair-bound due to cerebral palsy. As per cardiology She's not a good candidate for invasive or interventional procedures. I think that she should be treated conservatively   Hospital Course:    Chest pain  - Very atypical, present on palpation.  Does not appear to be cardiac in nature.   - Per outpatient cardiology note-not a candidate for further intervention  - Cardiac enzymes were negative x 3, 2D echo showed essentially normal LVEF. - D-dimer negative as well  - Patient has a history of complaining of chest pain purposefully to be brought to the hospital when she is unhappy with her current environment.  She will be discharged to SNF today (12/30).  HCAP (healthcare-associated pneumonia)  - Atelectasis vs PNA on chest xray at the time of admission. - Remained afebrile and without leukocytosis thru out hospitalization. - She was treated with broad spectrum antibiotics.  And will be discharged on Levaquin to finish out a 7 day course of therapy.  Hypothyroidism  - Continue with levothyroxine   Cerebral palsy  - Unfortunately pretty advanced, wheelchair bound mostly.Has contractures.  - Continue with baclofen   Depression/anxiety  - Apparently has been very difficult while at the facility -has been calling the police because she wanted to be moved to    another facility  -  Continue with Celexa, Klonopin, Risperdal, Effexor   Seizure disorder  - Continue with Keppra    Procedures:  2 D Echo 12/27 Study Conclusions  - Left ventricle: Images are very limited. LV is seen parasternal views, but not apical views. Segments seen parasternal move well. The EF is probably normal, but apex not seen. - Right ventricle: Poorly visualized. Looks good in parasternal  view.  Consultations: Psychiatry.  Patient was judged to have capacity to make her own decisions.  Discharge Exam: Filed Vitals:   03/01/12 1506 03/01/12 2057 03/01/12 2111 03/02/12 0452  BP: 123/78  126/66 123/74  Pulse: 106  106 94  Temp: 99.4 F (37.4 C)  98.7 F (37.1 C) 98.6 F (37 C)  TempSrc:   Oral Oral  Resp: 19  20 18   Height:      Weight:      SpO2: 94% 95% 97% 95%    General: Awake, alert, appears comfortable.  Asks me for a [place] blue light weight wheel chair. Cardiovascular: rrr no M/r/g Respiratory:No w/c/r, CTA, mild pain on palpation in the left upper chest. Abdomen:  Soft, Nt, Nd, no appreciable masses.  Discharge Instructions      Discharge Orders    Future Orders Please Complete By Expires   Diet - low sodium heart healthy      Increase activity slowly          Medication List     As of 03/02/2012 10:34 AM    TAKE these medications         acetaminophen 500 MG tablet   Commonly known as: TYLENOL   Take 1,000 mg by mouth 2 (two) times daily.      aspirin EC 81 MG tablet   Take 81 mg by mouth daily.      azelastine 137 MCG/SPRAY nasal spray   Commonly known as: ASTELIN   Place 1 spray into the nose 2 (two) times daily. Use in each nostril as directed      baclofen 10 MG tablet   Commonly known as: LIORESAL   Take 10 mg by mouth 4 (four) times daily.      budesonide-formoterol 160-4.5 MCG/ACT inhaler   Commonly known as: SYMBICORT   Inhale 1 puff into the lungs 2 (two) times daily.      CERTA-VITE PO   Take 1 tablet by mouth daily.      citalopram 40 MG tablet   Commonly known as: CELEXA   Take 40 mg by mouth at bedtime.      clonazePAM 1 MG tablet   Commonly known as: KLONOPIN   Take 1 tablet (1 mg total) by mouth 2 (two) times daily.      fexofenadine-pseudoephedrine 180-240 MG per 24 hr tablet   Commonly known as: ALLEGRA-D 24   Take 1 tablet by mouth every morning.      HYDROcodone-acetaminophen 10-325 MG per tablet    Commonly known as: NORCO   Take 1 tablet by mouth at bedtime as needed. For pain      ketotifen 0.025 % ophthalmic solution   Commonly known as: ZADITOR   Place 1 drop into both eyes 2 (two) times daily.      lamoTRIgine 100 MG tablet   Commonly known as: LAMICTAL   Take 100 mg by mouth 2 (two) times daily.      levalbuterol 0.63 MG/3ML nebulizer solution   Commonly known as: XOPENEX   Take 3 mLs (0.63 mg total) by nebulization every  8 (eight) hours as needed for wheezing.      levETIRAcetam 250 MG tablet   Commonly known as: KEPPRA   Take 250 mg by mouth every 12 (twelve) hours.      levofloxacin 750 MG tablet   Commonly known as: LEVAQUIN   Take 1 tablet (750 mg total) by mouth daily at 3 pm.      levothyroxine 25 MCG tablet   Commonly known as: SYNTHROID, LEVOTHROID   Take 25 mcg by mouth daily before breakfast.      LORazepam 1 MG tablet   Commonly known as: ATIVAN   Take 1 tablet (1 mg total) by mouth every 4 (four) hours as needed. For agitation      montelukast 10 MG tablet   Commonly known as: SINGULAIR   Take 10 mg by mouth every morning.      polyethylene glycol packet   Commonly known as: MIRALAX / GLYCOLAX   Take 17 g by mouth 2 (two) times daily.      pravastatin 40 MG tablet   Commonly known as: PRAVACHOL   Take 40 mg by mouth daily.      ranitidine 150 MG tablet   Commonly known as: ZANTAC   Take 150 mg by mouth 2 (two) times daily.      risperiDONE 2 MG tablet   Commonly known as: RISPERDAL   Take 2 mg by mouth 2 (two) times daily.      risperiDONE microspheres 25 MG injection   Commonly known as: RISPERDAL CONSTA   Inject 25 mg into the muscle every 14 (fourteen) days.      senna 8.6 MG Tabs   Commonly known as: SENOKOT   Take 2 tablets (17.2 mg total) by mouth daily as needed.      sodium chloride 0.65 % nasal spray   Commonly known as: OCEAN   Place 1 spray into the nose 2 (two) times daily.      SYSTANE BALANCE 0.6 % Soln    Generic drug: Propylene Glycol   Apply 1 drop to eye 4 (four) times daily.      venlafaxine XR 150 MG 24 hr capsule   Commonly known as: EFFEXOR-XR   Take 150 mg by mouth daily with breakfast.      vitamin C 500 MG tablet   Commonly known as: ASCORBIC ACID   Take 500 mg by mouth 2 (two) times daily.      Vitamin D 400 UNITS capsule   Take 400 Units by mouth every morning.           The results of significant diagnostics from this hospitalization (including imaging, microbiology, ancillary and laboratory) are listed below for reference.    Significant Diagnostic Studies: Dg Chest 1 View  02/27/2012  *RADIOLOGY REPORT*  Clinical Data: Chest pain and weakness.  CHEST - 1 VIEW  Comparison: Two-view chest 02/14/2012.  Findings: Pulmonary vascular congestion has increased.  The lung volumes are low.  Left pleural effusion is present.  Asymmetric left basilar airspace disease is noted.  The visualized soft tissues and bony thorax are unremarkable.  IMPRESSION:  1.  Increased pulmonary vascular congestion. 2.  New left pleural effusion. 3.  Left basilar airspace disease could represent atelectasis, but is suspicious for pneumonia.   Original Report Authenticated By: Marin Roberts, M.D.    Dg Chest 2 View  02/14/2012  *RADIOLOGY REPORT*  Clinical Data: Cough, congestion, chest pain  CHEST - 2 VIEW  Comparison: 01/14/2012  Findings: Normal heart size, mediastinal contours, and pulmonary vascularity. Minimal chronic peribronchial thickening. No infiltrate, pleural effusion or pneumothorax. Rotated exam with scoliosis noted.  IMPRESSION: No acute abnormalities.   Original Report Authenticated By: Ulyses Southward, M.D.    Dg Hand Complete Left  02/14/2012  *RADIOLOGY REPORT*  Clinical Data: Bilateral hand and wrist pain after injury.  LEFT HAND - COMPLETE 3+ VIEW  Comparison: 11/18/2004.  Findings: Mild degenerative changes in the interphalangeal joints. No evidence of acute fracture or  subluxation.  Deformity left fifth finger with extension of the distal interphalangeal joint and flexion of the proximal interphalangeal joint.  No focal bone lesion or bone destruction.  Bone cortex and trabecular architecture appear intact.  No significant changes since the previous study.  IMPRESSION: No acute bony abnormalities.   Original Report Authenticated By: Burman Nieves, M.D.    Dg Hand Complete Right  02/14/2012  *RADIOLOGY REPORT*  Clinical Data: Bilateral hand pain after injury.  RIGHT HAND - COMPLETE 3+ VIEW  Comparison: 11/18/2004.  Findings: Degenerative changes in the interphalangeal joints. Flexion deformity of the proximal interphalangeal joint of the fifth finger.  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  No evidence of acute fracture or subluxation.  No focal bone lesion or bone destruction.  Bone cortex and trabecular architecture appear intact.  No significant change since previous study.  IMPRESSION: No acute bony abnormalities.   Original Report Authenticated By: Burman Nieves, M.D.     Microbiology: Recent Results (from the past 240 hour(s))  MRSA PCR SCREENING     Status: Abnormal   Collection Time   02/27/12 10:09 PM      Component Value Range Status Comment   MRSA by PCR POSITIVE (*) NEGATIVE Final      Labs: Basic Metabolic Panel:  Lab 02/29/12 1610 02/28/12 0900 02/27/12 1416  NA 138 138 140  K 3.8 3.7 3.9  CL 102 102 104  CO2 24 24 --  GLUCOSE 100* 93 84  BUN 10 15 14   CREATININE 0.54 0.47* 0.70  CALCIUM 9.8 9.1 --  MG -- -- --  PHOS -- -- --   Liver Function Tests:  Lab 02/28/12 0900  AST 19  ALT 31  ALKPHOS 52  BILITOT 0.3  PROT 6.5  ALBUMIN 3.6   CBC:  Lab 02/29/12 0536 02/28/12 0900 02/27/12 1847 02/27/12 1416 02/27/12 1358  WBC 6.8 6.5 6.7 -- 9.3  NEUTROABS -- -- -- -- --  HGB 13.5 13.1 12.6 13.9 13.4  HCT 40.6 40.5 39.5 41.0 41.4  MCV 88.6 89.4 88.4 -- 88.3  PLT 161 173 165 -- 173    Cardiac Enzymes:  Lab 02/28/12 0900 02/28/12 0012 02/27/12 1846  CKTOTAL -- -- --  CKMB -- -- --  CKMBINDEX -- -- --  TROPONINI <0.30 <0.30 <0.30    SignedConley Canal [phone]  [facility] 03/02/2012, 10:34 AM

## 2012-03-02 NOTE — Discharge Summary (Signed)
Patient seen and examined by me.  Plan to d/c patient to SNF- working to find a new one per her request.  Will need continued psych care: psych recommendations include:  With numerous calls to police station, suggest behavioral approach: Limit # calls/day OR provide phone to receive [not dial out] calls only.   Consider pt ombudsman if available or daily visitor Len Childs, take wheelchair strolls, etc.] to minimize pt discontent for her immobility. Suggest therapist if she does not go to one.   Marlin Canary DO

## 2012-03-02 NOTE — Care Management Note (Signed)
    Page 1 of 1   03/02/2012     11:42:58 AM   CARE MANAGEMENT NOTE 03/02/2012  Patient:  Kristi Perry, Kristi Perry   Account Number:  [id number]  Date Initiated:  03/02/2012  Documentation initiated by:  Letha Cape  Subjective/Objective Assessment:   dx pna  admit- from [facility].  pt does not want to go back there.     Action/Plan:   pt/ot eval- rec snf   Anticipated DC Date:  03/02/2012   Anticipated DC Plan:  SKILLED NURSING FACILITY  In-house referral  Clinical Social Worker      DC Planning Services  CM consult      Choice offered to / List presented to:             Status of service:  Completed, signed off Medicare Important Message given?   (If response is "NO", the following Medicare IM given date fields will be blank) Date Medicare IM given:   Date Additional Medicare IM given:    Discharge Disposition:  SKILLED NURSING FACILITY  Per UR Regulation:  Reviewed for med. necessity/level of care/duration of stay  If discussed at Long Length of Stay Meetings, dates discussed:    Comments:  03/02/12 11:42 Letha Cape RN, BSN [phone] patient is for dc to SNF today, CSW following.

## 2012-03-02 NOTE — Progress Notes (Signed)
Patient discharged at 1610 via ambulance to [facility].           Kristi Perry

## 2012-03-18 ENCOUNTER — Emergency Department: Payer: Self-pay | Admitting: Emergency Medicine

## 2012-03-18 LAB — URINALYSIS, COMPLETE
Glucose,UR: NEGATIVE mg/dL (ref 0–75)
Ketone: NEGATIVE
Nitrite: NEGATIVE
WBC UR: 15 /HPF (ref 0–5)

## 2012-03-18 LAB — PRO B NATRIURETIC PEPTIDE: B-Type Natriuretic Peptide: 10 pg/mL (ref 0–125)

## 2012-03-18 LAB — COMPREHENSIVE METABOLIC PANEL
Albumin: 3.8 g/dL (ref 3.4–5.0)
Anion Gap: 6 — ABNORMAL LOW (ref 7–16)
BUN: 10 mg/dL (ref 7–18)
Bilirubin,Total: 0.4 mg/dL (ref 0.2–1.0)
Calcium, Total: 9.3 mg/dL (ref 8.5–10.1)
Chloride: 105 mmol/L (ref 98–107)
Co2: 28 mmol/L (ref 21–32)
EGFR (Non-African Amer.): >60
Osmolality: 276 (ref 275–301)
SGOT(AST): 25 U/L (ref 15–37)
Sodium: 139 mmol/L (ref 136–145)
Total Protein: 6.9 g/dL (ref 6.4–8.2)

## 2012-03-18 LAB — CBC
HGB: 13.5 g/dL (ref 12.0–16.0)
MCH: 30.1 pg (ref 26.0–34.0)
MCHC: 33.8 g/dL (ref 32.0–36.0)
MCV: 89 fL (ref 80–100)
RBC: 4.48 10*6/uL (ref 3.80–5.20)
RDW: 15.4 % — ABNORMAL HIGH (ref 11.5–14.5)

## 2012-03-18 LAB — CK TOTAL AND CKMB (NOT AT ARMC): CK-MB: 1.5 ng/mL (ref 0.5–3.6)

## 2012-04-03 ENCOUNTER — Encounter (HOSPITAL_COMMUNITY): Payer: Self-pay | Admitting: *Deleted

## 2012-04-03 ENCOUNTER — Inpatient Hospital Stay (HOSPITAL_COMMUNITY)
Admission: EM | Admit: 2012-04-03 | Discharge: 2012-04-07 | DRG: 205 | Disposition: A | Discharge status: Skilled Nursing Facility | Payer: Medicare Other | Attending: Internal Medicine | Admitting: Internal Medicine

## 2012-04-03 ENCOUNTER — Inpatient Hospital Stay (HOSPITAL_COMMUNITY): Payer: Medicare Other

## 2012-04-03 ENCOUNTER — Emergency Department (HOSPITAL_COMMUNITY): Payer: Medicare Other

## 2012-04-03 DIAGNOSIS — R6889 Other general symptoms and signs: Secondary | ICD-10-CM

## 2012-04-03 DIAGNOSIS — J9819 Other pulmonary collapse: Principal | ICD-10-CM | POA: Diagnosis present

## 2012-04-03 DIAGNOSIS — Z6838 Body mass index (BMI) 38.0-38.9, adult: Secondary | ICD-10-CM

## 2012-04-03 DIAGNOSIS — G809 Cerebral palsy, unspecified: Secondary | ICD-10-CM | POA: Diagnosis present

## 2012-04-03 DIAGNOSIS — E785 Hyperlipidemia, unspecified: Secondary | ICD-10-CM | POA: Diagnosis present

## 2012-04-03 DIAGNOSIS — R197 Diarrhea, unspecified: Secondary | ICD-10-CM | POA: Diagnosis present

## 2012-04-03 DIAGNOSIS — E039 Hypothyroidism, unspecified: Secondary | ICD-10-CM | POA: Diagnosis present

## 2012-04-03 DIAGNOSIS — R0602 Shortness of breath: Secondary | ICD-10-CM | POA: Diagnosis present

## 2012-04-03 DIAGNOSIS — N201 Calculus of ureter: Secondary | ICD-10-CM | POA: Diagnosis present

## 2012-04-03 DIAGNOSIS — J9600 Acute respiratory failure, unspecified whether with hypoxia or hypercapnia: Secondary | ICD-10-CM | POA: Diagnosis present

## 2012-04-03 DIAGNOSIS — Z1612 Extended spectrum beta lactamase (ESBL) resistance: Secondary | ICD-10-CM

## 2012-04-03 DIAGNOSIS — J189 Pneumonia, unspecified organism: Secondary | ICD-10-CM | POA: Diagnosis present

## 2012-04-03 DIAGNOSIS — F329 Major depressive disorder, single episode, unspecified: Secondary | ICD-10-CM | POA: Diagnosis present

## 2012-04-03 DIAGNOSIS — J9811 Atelectasis: Secondary | ICD-10-CM

## 2012-04-03 DIAGNOSIS — F29 Unspecified psychosis not due to a substance or known physiological condition: Secondary | ICD-10-CM | POA: Diagnosis present

## 2012-04-03 DIAGNOSIS — A498 Other bacterial infections of unspecified site: Secondary | ICD-10-CM | POA: Diagnosis present

## 2012-04-03 DIAGNOSIS — N390 Urinary tract infection, site not specified: Secondary | ICD-10-CM | POA: Diagnosis present

## 2012-04-03 DIAGNOSIS — J45909 Unspecified asthma, uncomplicated: Secondary | ICD-10-CM | POA: Diagnosis present

## 2012-04-03 DIAGNOSIS — F3289 Other specified depressive episodes: Secondary | ICD-10-CM | POA: Diagnosis present

## 2012-04-03 DIAGNOSIS — I10 Essential (primary) hypertension: Secondary | ICD-10-CM | POA: Diagnosis present

## 2012-04-03 DIAGNOSIS — N200 Calculus of kidney: Secondary | ICD-10-CM

## 2012-04-03 DIAGNOSIS — R0902 Hypoxemia: Secondary | ICD-10-CM

## 2012-04-03 DIAGNOSIS — R079 Chest pain, unspecified: Secondary | ICD-10-CM

## 2012-04-03 DIAGNOSIS — R112 Nausea with vomiting, unspecified: Secondary | ICD-10-CM | POA: Diagnosis present

## 2012-04-03 DIAGNOSIS — G894 Chronic pain syndrome: Secondary | ICD-10-CM | POA: Diagnosis present

## 2012-04-03 DIAGNOSIS — E041 Nontoxic single thyroid nodule: Secondary | ICD-10-CM | POA: Diagnosis present

## 2012-04-03 DIAGNOSIS — B964 Proteus (mirabilis) (morganii) as the cause of diseases classified elsewhere: Secondary | ICD-10-CM

## 2012-04-03 DIAGNOSIS — F411 Generalized anxiety disorder: Secondary | ICD-10-CM | POA: Diagnosis present

## 2012-04-03 HISTORY — DX: Calculus of kidney: N20.0

## 2012-04-03 LAB — COMPREHENSIVE METABOLIC PANEL
ALT: 24 U/L (ref 0–35)
AST: 18 U/L (ref 0–37)
AST: 18 U/L (ref 0–37)
Albumin: 3.6 g/dL (ref 3.5–5.2)
BUN: 12 mg/dL (ref 6–23)
Calcium: 9.2 mg/dL (ref 8.4–10.5)
Calcium: 8.6 mg/dL (ref 8.4–10.5)
Creatinine, Ser: 0.51 mg/dL (ref 0.50–1.10)
Creatinine, Ser: 0.53 mg/dL (ref 0.50–1.10)
Sodium: 135 mEq/L (ref 135–145)
Total Bilirubin: 0.2 mg/dL — ABNORMAL LOW (ref 0.3–1.2)
Total Protein: 6.3 g/dL (ref 6.0–8.3)
Total Protein: 6 g/dL (ref 6.0–8.3)

## 2012-04-03 LAB — CBC WITH DIFFERENTIAL/PLATELET
Basophils Absolute: 0 10*3/uL (ref 0.0–0.1)
Basophils Relative: 0 % (ref 0–1)
Eosinophils Absolute: 0.3 10*3/uL (ref 0.0–0.7)
HCT: 40.4 % (ref 36.0–46.0)
Hemoglobin: 13.4 g/dL (ref 12.0–15.0)
MCH: 30.2 pg (ref 26.0–34.0)
MCHC: 33.2 g/dL (ref 30.0–36.0)
Monocytes Absolute: 0.9 10*3/uL (ref 0.1–1.0)
Monocytes Relative: 10 % (ref 3–12)
Neutro Abs: 4.7 10*3/uL (ref 1.7–7.7)
RDW: 14.9 % (ref 11.5–15.5)

## 2012-04-03 LAB — CBC
HCT: 38.6 % (ref 36.0–46.0)
Hemoglobin: 12.8 g/dL (ref 12.0–15.0)
MCH: 30.5 pg (ref 26.0–34.0)
MCHC: 33.2 g/dL (ref 30.0–36.0)
RBC: 4.2 MIL/uL (ref 3.87–5.11)

## 2012-04-03 LAB — URINE MICROSCOPIC-ADD ON

## 2012-04-03 LAB — LIPASE, BLOOD: Lipase: 36 U/L (ref 11–59)

## 2012-04-03 LAB — URINALYSIS, ROUTINE W REFLEX MICROSCOPIC
Glucose, UA: NEGATIVE mg/dL
Protein, ur: NEGATIVE mg/dL
Specific Gravity, Urine: 1.018 (ref 1.005–1.030)
pH: 5.5 (ref 5.0–8.0)

## 2012-04-03 LAB — TROPONIN I
Troponin I: <0.3 ng/mL (ref <0.30)
Troponin I: <0.3 ng/mL (ref <0.30)

## 2012-04-03 LAB — PREGNANCY, URINE: Preg Test, Ur: NEGATIVE

## 2012-04-03 LAB — PRO B NATRIURETIC PEPTIDE: Pro B Natriuretic peptide (BNP): <5 pg/mL (ref 0–125)

## 2012-04-03 LAB — TSH: TSH: 0.775 u[IU]/mL (ref 0.350–4.500)

## 2012-04-03 MED ORDER — SODIUM CHLORIDE 0.9 % IV SOLN
INTRAVENOUS | Status: DC
  Administered 2012-04-03: 11:00:00 via INTRAVENOUS

## 2012-04-03 MED ORDER — DEXTROSE 5 % IV SOLN
1.0000 g | Freq: Once | INTRAVENOUS | Status: AC
  Administered 2012-04-03: 1 g via INTRAVENOUS
  Filled 2012-04-03: qty 10

## 2012-04-03 MED ORDER — LORAZEPAM 1 MG PO TABS
1.0000 mg | ORAL_TABLET | ORAL | Status: DC | PRN
  Administered 2012-04-04 – 2012-04-07 (×8): 1 mg via ORAL
  Filled 2012-04-03 (×8): qty 1

## 2012-04-03 MED ORDER — RISPERIDONE MICROSPHERES 25 MG IM SUSR: 25.0000 mg | INTRAMUSCULAR | Status: DC

## 2012-04-03 MED ORDER — BUDESONIDE-FORMOTEROL FUMARATE 160-4.5 MCG/ACT IN AERO
1.0000 | INHALATION_SPRAY | Freq: Two times a day (BID) | RESPIRATORY_TRACT | Status: DC
Start: 2012-04-03 — End: 2012-04-07
  Administered 2012-04-03 – 2012-04-07 (×8): 1 via RESPIRATORY_TRACT
  Filled 2012-04-03: qty 6

## 2012-04-03 MED ORDER — TECHNETIUM TO 99M ALBUMIN AGGREGATED
6.6000 | Freq: Once | INTRAVENOUS | Status: AC | PRN
  Administered 2012-04-03: 6.6 via INTRAVENOUS

## 2012-04-03 MED ORDER — LAMOTRIGINE 100 MG PO TABS
100.0000 mg | ORAL_TABLET | Freq: Two times a day (BID) | ORAL | Status: DC
  Administered 2012-04-03 – 2012-04-07 (×9): 100 mg via ORAL
  Filled 2012-04-03 (×10): qty 1

## 2012-04-03 MED ORDER — IOHEXOL 350 MG/ML SOLN
100.0000 mL | Freq: Once | INTRAVENOUS | Status: AC | PRN
  Administered 2012-04-03: 100 mL via INTRAVENOUS

## 2012-04-03 MED ORDER — CROMOLYN SODIUM 4 % OP SOLN
1.0000 [drp] | Freq: Four times a day (QID) | OPHTHALMIC | Status: DC
  Administered 2012-04-03 – 2012-04-07 (×16): 1 [drp] via OPHTHALMIC
  Filled 2012-04-03: qty 10

## 2012-04-03 MED ORDER — ASPIRIN EC 81 MG PO TBEC
81.0000 mg | DELAYED_RELEASE_TABLET | Freq: Every day | ORAL | Status: DC
  Administered 2012-04-03 – 2012-04-07 (×5): 81 mg via ORAL
  Filled 2012-04-03 (×5): qty 1

## 2012-04-03 MED ORDER — NAPHAZOLINE HCL 0.1 % OP SOLN
1.0000 [drp] | Freq: Four times a day (QID) | OPHTHALMIC | Status: DC | PRN
  Administered 2012-04-04 – 2012-04-06 (×4): 1 [drp] via OPHTHALMIC
  Filled 2012-04-03: qty 15

## 2012-04-03 MED ORDER — ONDANSETRON HCL 4 MG/2ML IJ SOLN: 4.0000 mg | Freq: Four times a day (QID) | INTRAMUSCULAR | Status: DC | PRN

## 2012-04-03 MED ORDER — RISPERIDONE 2 MG PO TABS
2.0000 mg | ORAL_TABLET | Freq: Two times a day (BID) | ORAL | Status: DC
  Administered 2012-04-03 – 2012-04-07 (×9): 2 mg via ORAL
  Filled 2012-04-03 (×11): qty 1

## 2012-04-03 MED ORDER — BACLOFEN 10 MG PO TABS
10.0000 mg | ORAL_TABLET | Freq: Four times a day (QID) | ORAL | Status: DC
  Administered 2012-04-03 – 2012-04-07 (×15): 10 mg via ORAL
  Filled 2012-04-03 (×21): qty 1

## 2012-04-03 MED ORDER — KETOTIFEN FUMARATE 0.025 % OP SOLN: 1.0000 [drp] | Freq: Two times a day (BID) | OPHTHALMIC | Status: DC

## 2012-04-03 MED ORDER — DEXTROSE 5 % IV SOLN
1.0000 g | INTRAVENOUS | Status: DC
  Filled 2012-04-03: qty 10

## 2012-04-03 MED ORDER — HYDROCODONE-ACETAMINOPHEN 10-325 MG PO TABS
1.0000 | ORAL_TABLET | Freq: Every evening | ORAL | Status: DC | PRN
  Administered 2012-04-03 – 2012-04-06 (×6): 1 via ORAL
  Filled 2012-04-03 (×6): qty 1

## 2012-04-03 MED ORDER — VENLAFAXINE HCL ER 150 MG PO CP24
150.0000 mg | ORAL_CAPSULE | Freq: Every day | ORAL | Status: DC
  Administered 2012-04-04 – 2012-04-07 (×4): 150 mg via ORAL
  Filled 2012-04-03 (×5): qty 1

## 2012-04-03 MED ORDER — ACETAMINOPHEN 650 MG RE SUPP: 650.0000 mg | Freq: Four times a day (QID) | RECTAL | Status: DC | PRN

## 2012-04-03 MED ORDER — SODIUM CHLORIDE 0.9 % IJ SOLN
3.0000 mL | Freq: Two times a day (BID) | INTRAMUSCULAR | Status: DC
  Administered 2012-04-03: 3 mL via INTRAVENOUS

## 2012-04-03 MED ORDER — TECHNETIUM TC 99M DIETHYLENETRIAME-PENTAACETIC ACID: 41.0000 | Freq: Once | INTRAVENOUS | Status: AC | PRN

## 2012-04-03 MED ORDER — PIPERACILLIN-TAZOBACTAM 3.375 G IVPB
3.3750 g | Freq: Three times a day (TID) | INTRAVENOUS | Status: DC
  Administered 2012-04-03 – 2012-04-06 (×9): 3.375 g via INTRAVENOUS
  Filled 2012-04-03 (×10): qty 50

## 2012-04-03 MED ORDER — SIMVASTATIN 20 MG PO TABS
20.0000 mg | ORAL_TABLET | Freq: Every day | ORAL | Status: DC
  Administered 2012-04-03 – 2012-04-06 (×4): 20 mg via ORAL
  Filled 2012-04-03 (×6): qty 1

## 2012-04-03 MED ORDER — LEVETIRACETAM 250 MG PO TABS
250.0000 mg | ORAL_TABLET | Freq: Two times a day (BID) | ORAL | Status: DC
  Administered 2012-04-03 – 2012-04-07 (×9): 250 mg via ORAL
  Filled 2012-04-03 (×10): qty 1

## 2012-04-03 MED ORDER — PROPYLENE GLYCOL 0.6 % OP SOLN: 1.0000 [drp] | Freq: Four times a day (QID) | OPHTHALMIC | Status: DC | PRN

## 2012-04-03 MED ORDER — ACETAMINOPHEN 325 MG PO TABS
650.0000 mg | ORAL_TABLET | Freq: Four times a day (QID) | ORAL | Status: DC | PRN
  Administered 2012-04-03 – 2012-04-04 (×2): 650 mg via ORAL
  Filled 2012-04-03 (×2): qty 2

## 2012-04-03 MED ORDER — AZELASTINE HCL 0.1 % NA SOLN
1.0000 | Freq: Two times a day (BID) | NASAL | Status: DC
  Administered 2012-04-03 – 2012-04-07 (×9): 1 via NASAL
  Filled 2012-04-03: qty 30

## 2012-04-03 MED ORDER — MONTELUKAST SODIUM 10 MG PO TABS
10.0000 mg | ORAL_TABLET | Freq: Every morning | ORAL | Status: DC
  Administered 2012-04-03 – 2012-04-07 (×5): 10 mg via ORAL
  Filled 2012-04-03 (×5): qty 1

## 2012-04-03 MED ORDER — ONDANSETRON HCL 4 MG PO TABS: 4.0000 mg | ORAL_TABLET | Freq: Four times a day (QID) | ORAL | Status: DC | PRN

## 2012-04-03 MED ORDER — LEVALBUTEROL HCL 0.63 MG/3ML IN NEBU
0.6300 mg | INHALATION_SOLUTION | Freq: Three times a day (TID) | RESPIRATORY_TRACT | Status: DC | PRN
  Filled 2012-04-03: qty 3

## 2012-04-03 MED ORDER — LEVOTHYROXINE SODIUM 25 MCG PO TABS
25.0000 ug | ORAL_TABLET | Freq: Every day | ORAL | Status: DC
  Administered 2012-04-03 – 2012-04-07 (×5): 25 ug via ORAL
  Filled 2012-04-03 (×6): qty 1

## 2012-04-03 MED ORDER — CITALOPRAM HYDROBROMIDE 40 MG PO TABS
40.0000 mg | ORAL_TABLET | Freq: Every day | ORAL | Status: DC
  Administered 2012-04-03 – 2012-04-06 (×4): 40 mg via ORAL
  Filled 2012-04-03 (×5): qty 1

## 2012-04-03 MED ORDER — SALINE SPRAY 0.65 % NA SOLN
1.0000 | Freq: Two times a day (BID) | NASAL | Status: DC
  Administered 2012-04-03 – 2012-04-07 (×8): 1 via NASAL
  Filled 2012-04-03: qty 44

## 2012-04-03 MED ORDER — CLONAZEPAM 1 MG PO TABS
1.0000 mg | ORAL_TABLET | Freq: Two times a day (BID) | ORAL | Status: DC
  Administered 2012-04-03 – 2012-04-07 (×9): 1 mg via ORAL
  Filled 2012-04-03 (×9): qty 1

## 2012-04-03 MED ORDER — ENOXAPARIN SODIUM 40 MG/0.4ML ~~LOC~~ SOLN
40.0000 mg | SUBCUTANEOUS | Status: DC
Start: 2012-04-03 — End: 2012-04-05
  Administered 2012-04-03 – 2012-04-05 (×3): 40 mg via SUBCUTANEOUS
  Filled 2012-04-03 (×3): qty 0.4

## 2012-04-03 MED ORDER — ALBUTEROL SULFATE HFA 108 (90 BASE) MCG/ACT IN AERS
2.0000 | INHALATION_SPRAY | Freq: Four times a day (QID) | RESPIRATORY_TRACT | Status: DC
  Administered 2012-04-03: 2 via RESPIRATORY_TRACT
  Filled 2012-04-03: qty 6.7

## 2012-04-03 MED ORDER — LORATADINE 10 MG PO TABS
10.0000 mg | ORAL_TABLET | Freq: Every day | ORAL | Status: DC
  Administered 2012-04-03 – 2012-04-07 (×5): 10 mg via ORAL
  Filled 2012-04-03 (×5): qty 1

## 2012-04-03 MED ORDER — SODIUM CHLORIDE 0.9 % IV SOLN
Freq: Once | INTRAVENOUS | Status: AC
  Administered 2012-04-03: 03:00:00 via INTRAVENOUS

## 2012-04-03 MED ORDER — SODIUM CHLORIDE 0.65 % NA SOLN: 1.0000 | Freq: Two times a day (BID) | NASAL | Status: DC

## 2012-04-03 NOTE — Progress Notes (Signed)
ANTIBIOTIC CONSULT NOTE - INITIAL  Pharmacy Consult for Zosyn Indication: hx of staghorn calculi and MDR e. coli   Allergies  Allergen Reactions  . Sulfa Antibiotics Hives  . Tape Rash    Paper tape    Patient Measurements: Height: 5\' 3"  (160 cm) Weight: 216 lb 0.8 oz (98 kg) IBW/kg (Calculated) : 52.4  Adjusted Body Weight:   Vital Signs: Temp: 98 F (36.7 C) (01/31 0857) Temp src: Oral (01/31 0857) BP: 123/78 mmHg (01/31 0857) Pulse Rate: 98  (01/31 0857) Intake/Output from previous day:   Intake/Output from this shift: Total I/O In: -  Out: 300 [Urine:300]  Labs:  [facility] 04/03/12 0937 04/03/12 0130  WBC 6.7 8.2  HGB 12.8 13.4  PLT 148* 148*  LABCREA -- --  CREATININE 0.53 0.51   Estimated Creatinine Clearance: 99 ml/min (by C-G formula based on Cr of 0.53). No results found for this basename: VANCOTROUGH:2,VANCOPEAK:2,VANCORANDOM:2,GENTTROUGH:2,GENTPEAK:2,GENTRANDOM:2,TOBRATROUGH:2,TOBRAPEAK:2,TOBRARND:2,AMIKACINPEAK:2,AMIKACINTROU:2,AMIKACIN:2, in the last 72 hours   Microbiology: Recent Results (from the past 720 hour(s))  MRSA PCR SCREENING     Status: Normal   Collection Time   04/03/12  9:28 AM      Component Value Range Status Comment   MRSA by PCR NEGATIVE  NEGATIVE Final     Medical History: Past Medical History  Diagnosis Date  . Sepsis(995.91)   . Hypertension   . DVT (deep vein thrombosis) in pregnancy   . Depression   . Chronic pain syndrome   . Allergic rhinitis   . Acute respiratory failure   . Epilepsy   . Migraine   . Chronic airway obstruction   . Cerebral palsy   . Dysphagia   . Hiatal hernia   . Morbid obesity 09-25-11    requires Hoyer lift. pt. doesn't stand or ambulate.  . Incontinent of urine 09-25-11    hx.  . Incontinent of feces 09-25-11    hx.  . Vitamin D deficiency   . Sepsis(995.91)     hx of   . Hyperlipidemia   . Mild intellectual disabilities   . Neurogenic bladder   . Hypothyroidism   . Anxiety   .  Unspecified psychosis   . Muscle weakness   . Dysphasia   . Pneumonia     hx of asp pneumonia 1/11-1/19/12  . Sacral decubitus ulcer     hx of   Medications: 1/31 >>Ceftriaxone >> 1/31  Assessment: 46  yoF with hx CP admitted from NH with CC of cough.  Pt started on ceftriaxone initally for UTI, now changed to zosyn for hx of staghorn calculi and MDR E. Coli.      Renal fxn: SCr 0.53,  CrCl ~100 ml/min  WBC WNL @ 6.7  Tm24h: 98.0  Cultures: 1/31 Urine: collected, MRSA PCR neg  Plan:  1.  Zosyn 3.375g IV q8h (infuse over 4 hours) 2.  F/u renal fxn, T, WBC, cultures, clinical course   Scotlynn Noyes E 04/03/2012,4:59 PM

## 2012-04-03 NOTE — ED Notes (Signed)
NP at bedside.

## 2012-04-03 NOTE — Progress Notes (Signed)
If patient is ready to return to [facility] over the weekend, please contact weekend CSW - Cassandra at cell#: [phone].   Clinical Social Work Department BRIEF PSYCHOSOCIAL ASSESSMENT 04/03/2012  Patient:  Kristi Perry, Kristi Perry     Account Number:  [id number]     Admit date:  04/03/2012  Clinical Social Worker:  Orpah Greek  Date/Time:  04/03/2012 03:05 PM  Referred by:  Physician  Date Referred:  04/03/2012 Referred for  Other - See comment   Other Referral:   Admitted from: [facility]   Interview type:  Family Other interview type:    PSYCHOSOCIAL DATA Living Status:  FACILITY Admitted from facility:  [facility] Level of care:  Skilled Nursing Facility Primary support name:  [name] (mother) h#: [phone] Primary support relationship to patient:  PARENT Degree of support available:   good    CURRENT CONCERNS Current Concerns  Post-Acute Placement   Other Concerns:    SOCIAL WORK ASSESSMENT / PLAN CSW spoke with patient's mother, [name] re: discharge planning. Patient was admitted from [facility] where mother plans for her to return at discharge.   Assessment/plan status:  Information/Referral to [facility] Other assessment/ plan:   Information/referral to community resources:   CSW completed FL2 and faxed information to [facility] [employer]. Confirmed with Crystal @ SNF that they would be able to take her back when she's ready.    PATIENT'S/FAMILY'S RESPONSE TO PLAN OF CARE: Patient's mother seemed pleased with [facility] & plans for her to return there at discharge.        Unice Bailey, LCSW [facility] Clinical Social Worker cell #: [phone]

## 2012-04-03 NOTE — ED Provider Notes (Signed)
History     CSN: [id number]  Arrival date & time 04/03/12  0106   First MD Initiated Contact with Patient 04/03/12 [phone]      Chief Complaint  Patient presents with  . Cough    (Consider location/radiation/quality/duration/timing/severity/associated sxs/prior treatment) HPI Comments: Patient states she thinks she has pneumonia again.  She reports multiple episodes of N/V/D tonight, bilateral hand pain for months.  Also requesting bilateral wrist splint because her hands/wrists hurt form wheelchair use  The history is provided by the patient.    Past Medical History  Diagnosis Date  . Sepsis(995.91)   . Hypertension   . DVT (deep vein thrombosis) in pregnancy   . Depression   . Chronic pain syndrome   . Allergic rhinitis   . Acute respiratory failure   . Epilepsy   . Migraine   . Chronic airway obstruction   . Cerebral palsy   . Dysphagia   . Hiatal hernia   . Morbid obesity 09-25-11    requires Hoyer lift. pt. doesn't stand or ambulate.  . Incontinent of urine 09-25-11    hx.  . Incontinent of feces 09-25-11    hx.  . Vitamin D deficiency   . Sepsis(995.91)     hx of   . Hyperlipidemia   . Mild intellectual disabilities   . Neurogenic bladder   . Hypothyroidism   . Anxiety   . Unspecified psychosis   . Muscle weakness   . Dysphasia   . Pneumonia     hx of asp pneumonia 1/11-1/19/12  . Sacral decubitus ulcer     hx of    Past Surgical History  Procedure Date  . Nephrolithotomy 10/07/2011    Procedure: NEPHROLITHOTOMY PERCUTANEOUS;  Surgeon: Marcine Matar, MD;  Location: [facility];  Service: Urology;  Laterality: Right;  kidney   . Cystoscopy with ureteroscopy 11/07/2011    Procedure: CYSTOSCOPY WITH URETEROSCOPY;  Surgeon: Marcine Matar, MD;  Location: [facility];  Service: Urology;  Laterality: Right;  Removal of right double J stent, Insertion right double J stent  . Stone extraction with basket 11/07/2011    Procedure: STONE EXTRACTION WITH BASKET;  Surgeon:  Marcine Matar, MD;  Location: [facility];  Service: Urology;  Laterality: Right;  . Nephrolithotomy 12/06/2011    Procedure: NEPHROLITHOTOMY PERCUTANEOUS;  Surgeon: Marcine Matar, MD;  Location: [facility];  Service: Urology;  Laterality: Right;   REPEAT PCNL     No family history on file.  History  Substance Use Topics  . Smoking status: Never Smoker   . Smokeless tobacco: Not on file  . Alcohol Use: No    OB History    Grav Para Term Preterm Abortions TAB SAB Ect Mult Living                  Review of Systems  Constitutional: Negative for fever and chills.  HENT: Negative for neck pain.   Eyes: Negative for redness.  Respiratory: Positive for cough. Negative for shortness of breath and wheezing.   Cardiovascular: Negative for chest pain and leg swelling.  Gastrointestinal: Positive for nausea, vomiting and diarrhea.  Genitourinary: Negative for dysuria, flank pain and decreased urine volume.  Musculoskeletal: Positive for arthralgias. Negative for myalgias.  Skin: Negative for rash and wound.  Neurological: Negative for weakness, numbness and headaches.    Allergies  Sulfa antibiotics and Tape  Home Medications   Current Outpatient Rx  Name  Route  Sig  Dispense  Refill  . ACETAMINOPHEN 500  MG PO TABS   Oral   Take 1,000 mg by mouth 2 (two) times daily.         . ASPIRIN EC 81 MG PO TBEC   Oral   Take 81 mg by mouth daily.         Marland Kitchen BACLOFEN 10 MG PO TABS   Oral   Take 10 mg by mouth 4 (four) times daily.          . BUDESONIDE-FORMOTEROL FUMARATE 160-4.5 MCG/ACT IN AERO   Inhalation   Inhale 1 puff into the lungs 2 (two) times daily.         Marland Kitchen VITAMIN D 400 UNITS PO CAPS   Oral   Take 400 Units by mouth every morning.          Marland Kitchen CITALOPRAM HYDROBROMIDE 40 MG PO TABS   Oral   Take 40 mg by mouth at bedtime.         Marland Kitchen CLONAZEPAM 1 MG PO TABS   Oral   Take 1 tablet (1 mg total) by mouth 2 (two) times daily.   30 tablet   0   .  FEXOFENADINE-PSEUDOEPHED ER 180-240 MG PO TB24   Oral   Take 1 tablet by mouth every morning.         Marland Kitchen KETOTIFEN FUMARATE 0.025 % OP SOLN   Both Eyes   Place 1 drop into both eyes 2 (two) times daily.         Marland Kitchen LAMOTRIGINE 100 MG PO TABS   Oral   Take 100 mg by mouth 2 (two) times daily.          Marland Kitchen LEVETIRACETAM 250 MG PO TABS   Oral   Take 250 mg by mouth every 12 (twelve) hours.         Marland Kitchen LEVOTHYROXINE SODIUM 25 MCG PO TABS   Oral   Take 25 mcg by mouth daily before breakfast.          . MONTELUKAST SODIUM 10 MG PO TABS   Oral   Take 10 mg by mouth every morning.          Marland Kitchen POLYETHYLENE GLYCOL 3350 PO PACK   Oral   Take 17 g by mouth 2 (two) times daily.   14 each   0   . PRAVASTATIN SODIUM 40 MG PO TABS   Oral   Take 40 mg by mouth daily.          Marland Kitchen PROPYLENE GLYCOL 0.6 % OP SOLN   Ophthalmic   Apply 1 drop to eye 4 (four) times daily as needed. For dry eyes         . RISPERIDONE 2 MG PO TABS   Oral   Take 2 mg by mouth 2 (two) times daily.         Marland Kitchen RISPERIDONE MICROSPHERES 25 MG IM SUSR   Intramuscular   Inject 25 mg into the muscle every 14 (fourteen) days.         . SENNA 8.6 MG PO TABS   Oral   Take 2 tablets (17.2 mg total) by mouth daily as needed.   120 each      . SODIUM CHLORIDE 0.65 % NA SOLN   Nasal   Place 1 spray into the nose 2 (two) times daily.         . VENLAFAXINE HCL ER 150 MG PO CP24   Oral   Take 150 mg by mouth daily with breakfast.         .  VITAMIN C 500 MG PO TABS   Oral   Take 500 mg by mouth 2 (two) times daily.         . AZELASTINE HCL 137 MCG/SPRAY NA SOLN   Nasal   Place 1 spray into the nose 2 (two) times daily. For allergies         . HYDROCODONE-ACETAMINOPHEN 10-325 MG PO TABS   Oral   Take 1 tablet by mouth at bedtime as needed. For pain   30 tablet   0   . LEVALBUTEROL HCL 0.63 MG/3ML IN NEBU   Nebulization   Take 3 mLs (0.63 mg total) by nebulization every 8 (eight) hours  as needed for wheezing.   3 mL   0   . LORAZEPAM 1 MG PO TABS   Oral   Take 1 tablet (1 mg total) by mouth every 4 (four) hours as needed. For agitation   30 tablet   0     BP 125/76  Pulse 96  Temp 99.5 F (37.5 C) (Rectal)  Resp 18  SpO2 92%  LMP 03/31/2012  Physical Exam  Constitutional: She appears well-developed and well-nourished.       obese  HENT:  Head: Normocephalic.  Right Ear: External ear normal.  Left Ear: External ear normal.  Mouth/Throat: Oropharynx is clear and moist.  Eyes: Pupils are equal, round, and reactive to light.  Neck: Normal range of motion.  Cardiovascular: Tachycardia present.   Pulmonary/Chest: Effort normal and breath sounds normal. She has no wheezes. She exhibits no tenderness.  Abdominal: Soft. Bowel sounds are normal. She exhibits no distension. There is no tenderness.  Neurological: She is alert.  Skin: Skin is warm. No rash noted. No erythema.    ED Course  Procedures (including critical care time)  Labs Reviewed  CBC WITH DIFFERENTIAL - Abnormal; Notable for the following:    Platelets 148 (*)     All other components within normal limits  COMPREHENSIVE METABOLIC PANEL - Abnormal; Notable for the following:    Sodium 134 (*)     Glucose, Bld 105 (*)     Total Bilirubin 0.2 (*)     All other components within normal limits  URINALYSIS, ROUTINE W REFLEX MICROSCOPIC - Abnormal; Notable for the following:    APPearance CLOUDY (*)     Hgb urine dipstick LARGE (*)     Nitrite POSITIVE (*)     Leukocytes, UA LARGE (*)     All other components within normal limits  URINE MICROSCOPIC-ADD ON - Abnormal; Notable for the following:    Squamous Epithelial / LPF FEW (*)     Bacteria, UA MANY (*)     All other components within normal limits  URINE CULTURE   Dg Chest 2 View  04/03/2012  *RADIOLOGY REPORT*  Clinical Data: Cough, congestion and shortness of breath  CHEST - 2 VIEW  Comparison: 02/27/2012 and prior chest radiographs a   Findings: The cardiomediastinal silhouette is unremarkable. This is a mildly low volume film. Left basilar opacity identified primarily on the lateral view is noted - suspect atelectasis but airspace disease/pneumonia is not excluded. There is no evidence of pleural effusion or pneumothorax. No acute bony abnormalities identified.  IMPRESSION: Left lower lobe opacity - suspect atelectasis but pneumonia is not excluded.   Original Report Authenticated By: Harmon Pier, M.D.    Ct Angio Chest Pe W/cm &/or Wo Cm  04/03/2012  *RADIOLOGY REPORT*  Clinical Data: Hypoxia, cough  CT ANGIOGRAPHY CHEST  Technique:  Multidetector CT imaging of the chest using the standard protocol during bolus administration of intravenous contrast. Multiplanar reconstructed images including MIPs were obtained and reviewed to evaluate the vascular anatomy.  Contrast: OMNIPAQUE IOHEXOL 350 MG/ML SOLN  Comparison: 04/03/2012, 04/24/2004  Findings: Enlarged left lobe of the thyroid gland, displaces the trachea rightward.  Nondiagnostic evaluation for pulmonary embolism secondary to contrast bolus timing, respiratory motion, and streak artifact from extremity positioning.  Normal caliber aorta.  Normal heart size.  No pleural or pericardial effusion.  Small hiatal hernia.  No intrathoracic lymphadenopathy.  Central airways are patent.  Bibasilar atelectasis.  No pneumothorax.  Detailed parenchymal evaluation is degraded by respiratory motion.  Limited images through the upper abdomen show no acute finding.  No acute osseous finding.  IMPRESSION: Nondiagnostic for pulmonary embolism. Recommend repeat examination or VQ scan if clinical concern persists.  Bibasilar atelectasis.  Enlarged left lobe of the thyroid gland.  This has considerably enlarged since 2006.  Recommend thyroid ultrasound.   Original Report Authenticated By: Jearld Lesch, M.D.      1. Hypoxia   2. UTI (lower urinary tract infection)       MDM  Due to hypoxia  and rr will ct chest to evalaute for PE Chest CT Scan negative for PE  Will admit pt for URI hypoxia and have sleep study proformed        Arman Filter, NP 04/03/12 1610  Arman Filter, NP 04/03/12 [phone]

## 2012-04-03 NOTE — ED Notes (Signed)
Patient also c/o multiple episodes of vomiting and diarrhea today. Pt says that she feels weak

## 2012-04-03 NOTE — ED Notes (Signed)
Pt lives at [facility]. DX with [facility] two weeks ago, pt has completed antibiotics, still having rhonchi, congestion, saturations 88% on room air, febrile, "not feeling well". Request eval of patient.

## 2012-04-03 NOTE — ED Notes (Signed)
ZOX:[id number]<AV> Expected date:<BR> Expected time:<BR> Means of arrival:<BR> Comments:<BR> EMS/46 yo female from Nursing Facility-treatment for pneumonia-still not feeling better/low grade fever

## 2012-04-03 NOTE — ED Notes (Signed)
Patient returned from CT

## 2012-04-03 NOTE — ED Notes (Signed)
Patient transported to CT 

## 2012-04-03 NOTE — H&P (Signed)
Kristi Perry is an 47 y.o. female.  Patient was seen and examined on April 03, 2012. PCP - [facility]. Dr.Dasanyake.  Chief Complaint: Cough shortness of breath nausea and vomiting and diarrhea. HPI: 47 year-old female with history of cerebral palsy, from plasma, depression was brought from the nursing home after patient was complaining of cough. On arrival patient also was stating that she was having some shortness of breath and was found to be mildly hypoxic. In addition patient has been having some nausea vomiting and diarrhea for last 3-4 days. Denies any fever chills. Occasionally has some chest pressure presently chest pain-free. CT angiogram of the chest was done which does not show definite pulmonary embolism and radiologist has recommended VQ scan. UA shows features consistent with UTI and has been started on antibiotics.  Past Medical History  Diagnosis Date  . Sepsis(995.91)   . Hypertension   . DVT (deep vein thrombosis) in pregnancy   . Depression   . Chronic pain syndrome   . Allergic rhinitis   . Acute respiratory failure   . Epilepsy   . Migraine   . Chronic airway obstruction   . Cerebral palsy   . Dysphagia   . Hiatal hernia   . Morbid obesity 09-25-11    requires Hoyer lift. pt. doesn't stand or ambulate.  . Incontinent of urine 09-25-11    hx.  . Incontinent of feces 09-25-11    hx.  . Vitamin D deficiency   . Sepsis(995.91)     hx of   . Hyperlipidemia   . Mild intellectual disabilities   . Neurogenic bladder   . Hypothyroidism   . Anxiety   . Unspecified psychosis   . Muscle weakness   . Dysphasia   . Pneumonia     hx of asp pneumonia 1/11-1/19/12  . Sacral decubitus ulcer     hx of    Past Surgical History  Procedure Date  . Nephrolithotomy 10/07/2011    Procedure: NEPHROLITHOTOMY PERCUTANEOUS;  Surgeon: Marcine Matar, MD;  Location: [facility];  Service: Urology;  Laterality: Right;  kidney   . Cystoscopy with ureteroscopy 11/07/2011     Procedure: CYSTOSCOPY WITH URETEROSCOPY;  Surgeon: Marcine Matar, MD;  Location: [facility];  Service: Urology;  Laterality: Right;  Removal of right double J stent, Insertion right double J stent  . Stone extraction with basket 11/07/2011    Procedure: STONE EXTRACTION WITH BASKET;  Surgeon: Marcine Matar, MD;  Location: [facility];  Service: Urology;  Laterality: Right;  . Nephrolithotomy 12/06/2011    Procedure: NEPHROLITHOTOMY PERCUTANEOUS;  Surgeon: Marcine Matar, MD;  Location: [facility];  Service: Urology;  Laterality: Right;   REPEAT PCNL     History reviewed. No pertinent family history. Social History:  reports that she has never smoked. She does not have any smokeless tobacco history on file. She reports that she does not drink alcohol or use illicit drugs.  Allergies:  Allergies  Allergen Reactions  . Sulfa Antibiotics Hives  . Tape Rash    Paper tape     (Not in a hospital admission)  Results for orders placed during the hospital encounter of 04/03/12 (from the past 48 hour(s))  CBC WITH DIFFERENTIAL     Status: Abnormal   Collection Time   04/03/12  1:30 AM      Component Value Range Comment   WBC 8.2  4.0 - 10.5 K/uL    RBC 4.43  3.87 - 5.11 MIL/uL  Hemoglobin 13.4  12.0 - 15.0 g/dL    HCT 81.1  91.4 - 78.2 %    MCV 91.2  78.0 - 100.0 fL    MCH 30.2  26.0 - 34.0 pg    MCHC 33.2  30.0 - 36.0 g/dL    RDW 95.6  21.3 - 08.6 %    Platelets 148 (*) 150 - 400 K/uL    Neutrophils Relative 57  43 - 77 %    Neutro Abs 4.7  1.7 - 7.7 K/uL    Lymphocytes Relative 29  12 - 46 %    Lymphs Abs 2.4  0.7 - 4.0 K/uL    Monocytes Relative 10  3 - 12 %    Monocytes Absolute 0.9  0.1 - 1.0 K/uL    Eosinophils Relative 4  0 - 5 %    Eosinophils Absolute 0.3  0.0 - 0.7 K/uL    Basophils Relative 0  0 - 1 %    Basophils Absolute 0.0  0.0 - 0.1 K/uL   COMPREHENSIVE METABOLIC PANEL     Status: Abnormal   Collection Time   04/03/12  1:30 AM      Component Value Range Comment    Sodium 134 (*) 135 - 145 mEq/L    Potassium 3.8  3.5 - 5.1 mEq/L    Chloride 98  96 - 112 mEq/L    CO2 25  19 - 32 mEq/L    Glucose, Bld 105 (*) 70 - 99 mg/dL    BUN 12  6 - 23 mg/dL    Creatinine, Ser 5.78  0.50 - 1.10 mg/dL    Calcium 9.2  8.4 - 46.9 mg/dL    Total Protein 6.3  6.0 - 8.3 g/dL    Albumin 3.6  3.5 - 5.2 g/dL    AST 18  0 - 37 U/L    ALT 27  0 - 35 U/L    Alkaline Phosphatase 54  39 - 117 U/L    Total Bilirubin 0.2 (*) 0.3 - 1.2 mg/dL    GFR calc non Af Amer >90  >90 mL/min    GFR calc Af Amer >90  >90 mL/min   URINALYSIS, ROUTINE W REFLEX MICROSCOPIC     Status: Abnormal   Collection Time   04/03/12  1:31 AM      Component Value Range Comment   Color, Urine YELLOW  YELLOW    APPearance CLOUDY (*) CLEAR    Specific Gravity, Urine 1.018  1.005 - 1.030    pH 5.5  5.0 - 8.0    Glucose, UA NEGATIVE  NEGATIVE mg/dL    Hgb urine dipstick LARGE (*) NEGATIVE    Bilirubin Urine NEGATIVE  NEGATIVE    Ketones, ur NEGATIVE  NEGATIVE mg/dL    Protein, ur NEGATIVE  NEGATIVE mg/dL    Urobilinogen, UA 0.2  0.0 - 1.0 mg/dL    Nitrite POSITIVE (*) NEGATIVE    Leukocytes, UA LARGE (*) NEGATIVE   URINE MICROSCOPIC-ADD ON     Status: Abnormal   Collection Time   04/03/12  1:31 AM      Component Value Range Comment   Squamous Epithelial / LPF FEW (*) RARE    WBC, UA TOO NUMEROUS TO COUNT  <3 WBC/hpf    RBC / HPF 3-6  <3 RBC/hpf    Bacteria, UA MANY (*) RARE    Dg Chest 2 View  04/03/2012  *RADIOLOGY REPORT*  Clinical Data: Cough, congestion and shortness of  breath  CHEST - 2 VIEW  Comparison: 02/27/2012 and prior chest radiographs a  Findings: The cardiomediastinal silhouette is unremarkable. This is a mildly low volume film. Left basilar opacity identified primarily on the lateral view is noted - suspect atelectasis but airspace disease/pneumonia is not excluded. There is no evidence of pleural effusion or pneumothorax. No acute bony abnormalities identified.  IMPRESSION: Left  lower lobe opacity - suspect atelectasis but pneumonia is not excluded.   Original Report Authenticated By: Harmon Pier, M.D.    Ct Angio Chest Pe W/cm &/or Wo Cm  04/03/2012  *RADIOLOGY REPORT*  Clinical Data: Hypoxia, cough  CT ANGIOGRAPHY CHEST  Technique:  Multidetector CT imaging of the chest using the standard protocol during bolus administration of intravenous contrast. Multiplanar reconstructed images including MIPs were obtained and reviewed to evaluate the vascular anatomy.  Contrast: OMNIPAQUE IOHEXOL 350 MG/ML SOLN  Comparison: 04/03/2012, 04/24/2004  Findings: Enlarged left lobe of the thyroid gland, displaces the trachea rightward.  Nondiagnostic evaluation for pulmonary embolism secondary to contrast bolus timing, respiratory motion, and streak artifact from extremity positioning.  Normal caliber aorta.  Normal heart size.  No pleural or pericardial effusion.  Small hiatal hernia.  No intrathoracic lymphadenopathy.  Central airways are patent.  Bibasilar atelectasis.  No pneumothorax.  Detailed parenchymal evaluation is degraded by respiratory motion.  Limited images through the upper abdomen show no acute finding.  No acute osseous finding.  IMPRESSION: Nondiagnostic for pulmonary embolism. Recommend repeat examination or VQ scan if clinical concern persists.  Bibasilar atelectasis.  Enlarged left lobe of the thyroid gland.  This has considerably enlarged since 2006.  Recommend thyroid ultrasound.   Original Report Authenticated By: Jearld Lesch, M.D.     Review of Systems  Constitutional: Negative.   HENT: Negative.   Eyes: Negative.   Respiratory: Positive for cough and shortness of breath.   Cardiovascular: Negative.   Gastrointestinal: Positive for nausea, vomiting and diarrhea.  Genitourinary: Negative.   Musculoskeletal: Negative.   Skin: Negative.   Neurological: Negative.   Endo/Heme/Allergies: Negative.   Psychiatric/Behavioral: Negative.     Blood pressure  126/56, pulse 96, temperature 99.5 F (37.5 C), temperature source Rectal, resp. rate 20, last menstrual period 03/31/2012, SpO2 96.00%. Physical Exam  Constitutional: She is oriented to person, place, and time. She appears well-developed and well-nourished. No distress.  HENT:  Head: Normocephalic and atraumatic.  Right Ear: External ear normal.  Left Ear: External ear normal.  Nose: Nose normal.  Mouth/Throat: Oropharynx is clear and moist. No oropharyngeal exudate.  Eyes: Conjunctivae normal are normal. Pupils are equal, round, and reactive to light. Right eye exhibits no discharge. Left eye exhibits no discharge. No scleral icterus.  Neck: Normal range of motion. Neck supple.  Cardiovascular: Regular rhythm.        Tachycardia.  Respiratory: Effort normal and breath sounds normal. No respiratory distress. She has no wheezes. She has no rales.  GI: Soft. Bowel sounds are normal. She exhibits no distension. There is no tenderness. There is no rebound.  Musculoskeletal: She exhibits no edema.  Neurological: She is alert and oriented to person, place, and time.  Skin: Skin is warm and dry. She is not diaphoretic.     Assessment/Plan #1. Shortness of breath - I have ordered VQ scan. Continue to monitor in telemetry. Continue with patient's inhaler. Patient at this time is not wheezing. #2. Nausea vomiting and diarrhea - I have ordered stool for C. difficile and cultures. See #3. #3.  UTI - patient does have history of renal stones requiring stent and also nephrostomy tube placement. At this time I have ordered CT abdomen pelvis to rule out any hydronephrosis or renal stones. Continue ceftriaxone. Follow urine cultures. #4. Bronchial asthma - presently not wheezing. Continue inhalers. #5. Hypothyroidism - CT shows a large enlarged thyroid for which sonogram of the thyroid has been ordered. Follow TSH. #6. History of depression - continue present medications. #7. History of cerebral  palsy.  CODE STATUS - full code.  Eduard Clos. 04/03/2012, 6:45 AM

## 2012-04-03 NOTE — Progress Notes (Signed)
   CARE MANAGEMENT NOTE 04/03/2012  Patient:  Kristi Perry, Kristi Perry   Account Number:  [id number]  Date Initiated:  04/03/2012  Documentation initiated by:  Jiles Crocker  Subjective/Objective Assessment:   ADMITTED WITH PNEUMONIA     Action/Plan:   PATIENT RESIDES IN A NURSING FACILITY; SOC WORKER REFERRAL PLACED   Anticipated DC Date:  04/10/2012   Anticipated DC Plan:  SKILLED NURSING FACILITY  In-house referral  Clinical Social Worker      DC Planning Services  CM consult          Status of service:  In process, will continue to follow Medicare Important Message given?  NA - LOS <3 / Initial given by admissions (If response is "NO", the following Medicare IM given date fields will be blank)  Per UR Regulation:  Reviewed for med. necessity/level of care/duration of stay  Comments:  04/03/2012- B Nevia Henkin RN,BSN,MHA

## 2012-04-03 NOTE — ED Notes (Signed)
Attempted to call report to floor. RN busy and unable to take report. Will call back when she is able

## 2012-04-03 NOTE — Progress Notes (Signed)
[facility] PROGRESS NOTE  Shawni Volkov Montee ZOX:[id number] DOB: [birth year] DOA: 04/03/2012 PCP: Karlene Einstein, MD  Assessment/Plan  #1. Shortness of breath:  CXR demonstrated possible pneumonia, but CTa was consistent with atelectasis.  CTa was nondiagnostic due to contrast timing, but VQ scan was negative.   -  Wean oxygen as tolerated -  Schedule albuterol treatments with inhalers to encourage deep breathing and prevent asthma exacerbation -  Incentive spirometry -  troponins neg x 2  #2. Nausea vomiting and diarrhea - tolerating some food and no diarrhea since yesterday AM -  C. difficile and cultures not obtained as no BMs since admission  #3. UTI - history of renal stones requiring stent and also nephrostomy tube placement.  Has staghorn calculi, but was not visualized due to contrast obscuring view on CT today.  Had MDR E. Coli UTI about one month ago -  D/c ceftriaxone -  Start zosyn (last culture was sensitive)  -  F/u urine culture -  Spoke with radiology regarding results of CT, recommend repeat CT scan a/p w/o contrast tomorrow with scout film to determine if contrast has completely left kidneys.   -  If sick, last E. Coli was sensitive to tobramycin, gentamicin, and mactrobid.  Not tested against penems.    #4. Bronchial asthma - presently not wheezing.  -  Schedule albuterol for now  -  Continue symbicort and singulair  #5. Hypothyroidism - TSH wnl -  Continue synthroid  Thyroid mass of left lobe -  US guided biopsy of mass either on Monday if inpatient or next week as outpatient if discharged -  fT4  #6. History of depression and entitled personality disorder- continue present medications.  -  Continue clonazepam -  Cont effexor XR -  Continue citalopgram -  Continue risperidone  #7. History of epilepsy, no current seizures -  Cont lamotrigine  Diet:  Dysphagia 3 witih thin liquids Access:  PIV IVF:  NS at 51ml/h Proph:  lovenox  Code Status: full  code Family Communication: spoke with patient alone Disposition Plan: pending results of urine culture, repeat CT scan abd   Consultants:  none  Procedures:  CTa chest 1/31  CT a/p 1/31 (unable to detect kidney stones due to residual contrast from CTa chest)  VQ 1/31  US neck 1/31  Antibiotics:  Ceftriaxone 1/31 >> 1/31  Zosyn 1/31 >>    HPI/Subjective:  Patient states that she continues to have shortness of breath and wants nasal canula all the time, although per nursing staff, she was 100% on RA when assisting with turning in bed.  Has some nausea, but denies vomiting since yesterday morning.  No diarrhea or other BM since yesterday morning but is passing gas.  Denies chest pain and discomfort, but is asking for IV morphine.    Objective: Filed Vitals:   04/03/12 0407 04/03/12 0627 04/03/12 0726 04/03/12 0857  BP:  126/56 107/53 123/78  Pulse:  96 102 98  Temp:    98 F (36.7 C)  TempSrc:    Oral  Resp:  20 14 20   Height:    5\' 3"  (1.6 m)  Weight:    98 kg (216 lb 0.8 oz)  SpO2: 92% 96% 100% 97%    Intake/Output Summary (Last 24 hours) at 04/03/12 1641 Last data filed at 04/03/12 0932  Gross per 24 hour  Intake      0 ml  Output    300 ml  Net   -300 ml  Filed Weights   04/03/12 0857  Weight: 98 kg (216 lb 0.8 oz)    Exam:   General:  Obese CF, no acute distress, sitting in bed, nasal canula in place  HEENT:  Dysconjugate gaze  Cardiovascular:  RRR, no mrg, 2+ pulses  Respiratory:  Diminished bilateral breath sounds at bases, no wheezes, rales, or rhonchi  Abdomen:  Hyperactive BS, soft, nondistended, nontender  MSK:  Decreased muscle tone and bulk, particularly lower extremities, 1+ pitting edema, externally rotated legs.    Neuro:  No lower extremity strength,  Upper extremities 4/5 proximal strength, 5-/5 grip strength.    Data Reviewed: Basic Metabolic Panel:  Lab 04/03/12 1610 04/03/12 0130  NA 135 134*  K 3.9 3.8  CL 100 98  CO2  26 25  GLUCOSE 88 105*  BUN 10 12  CREATININE 0.53 0.51  CALCIUM 8.6 9.2  MG -- --  PHOS -- --   Liver Function Tests:  Lab 04/03/12 0937 04/03/12 0130  AST 18 18  ALT 24 27  ALKPHOS 55 54  BILITOT 0.3 0.2*  PROT 6.0 6.3  ALBUMIN 3.4* 3.6    Lab 04/03/12 0937  LIPASE 36  AMYLASE --   No results found for this basename: AMMONIA:5 in the last 168 hours CBC:  Lab 04/03/12 0937 04/03/12 0130  WBC 6.7 8.2  NEUTROABS -- 4.7  HGB 12.8 13.4  HCT 38.6 40.4  MCV 91.9 91.2  PLT 148* 148*   Cardiac Enzymes:  Lab 04/03/12 1523 04/03/12 0937  CKTOTAL -- --  CKMB -- --  CKMBINDEX -- --  TROPONINI <0.30 <0.30   BNP (last 3 results)  Basename 04/03/12 0937  PROBNP <5.0   CBG: No results found for this basename: GLUCAP:5 in the last 168 hours  Recent Results (from the past 240 hour(s))  MRSA PCR SCREENING     Status: Normal   Collection Time   04/03/12  9:28 AM      Component Value Range Status Comment   MRSA by PCR NEGATIVE  NEGATIVE Final      Studies: Ct Abdomen Pelvis Wo Contrast  04/03/2012  *RADIOLOGY REPORT*  Clinical Data: Hydronephrosis.  History of nephrolithotomy.  Chest pain.  CT ABDOMEN AND PELVIS WITHOUT CONTRAST  Technique:  Multidetector CT imaging of the abdomen and pelvis was performed following the standard protocol without intravenous contrast.  Comparison: 04/03/2012  Findings: Linear subsegmental atelectasis noted in both lower lobes. Technical factors related to patient body habitus reduce diagnostic sensitivity and specificity.  The visualized portion of the liver, spleen, pancreas, and adrenal glands appear unremarkable in noncontrast CT appearance.  Contrast medium administered with the patient's recent chest CT is present throughout the renal collecting systems and ureters.  The patient has known history of right-sided calculi, but any nonobstructive calculi in the right collecting system are obscured by the surrounding contrast which is of similar  density.  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  No hydronephrosis observed.  Small retroperitoneal lymph nodes are not pathologically enlarged by size criteria.  Scattered diverticula of the descending and sigmoid colon noted.  A hypodense 2.8 x 2.4 cm left adnexal cystic lesion is present. No pathologic pelvic adenopathy is identified.  Exaggerated lumbar lordosis noted.  Chronic superior endplate can cavity at the L4 vertebra is present.  There is a segmental S1 vertebra.  Grade 1 posterior subluxation at L5-S1 noted  Left iliopsoas atrophy is stable from  prior exam. Despite efforts by the patient and technologist, motion artifact is present on some series of today's examination and could not be totally eliminated. This reduces diagnostic sensitivity and specificity.  IMPRESSION:  1.  No hydronephrosis or hydroureter.  I cannot exclude nonobstructive calculi due to the presence of contrast in the collecting systems.  There is a small calyceal diverticulum of the right kidney upper pole subtending right upper pole scarring. 2.  Linear subsegmental atelectasis in both lower lobes. 3.  Scattered sigmoid and descending colon diverticula. 4.  Cystic left adnexal/ovarian lesion measures up to 2.8 cm in size.  This is new compared to 11/28/2011.  Probably simply represents an ovarian cyst.  Given its size and to the patient's expected premenopausal status, no further workup is required.  This recommendation follows ACR consensus guidelines:  White Paper of the ACR Incidental Findings Committee II on Adnexal Findings.  J Am Coll Radiol 2013:10:675-681. 5.  Exaggerated lumbar lordosis.   Original Report Authenticated By: Gaylyn Rong, M.D.    Dg Chest 2 View  04/03/2012  *RADIOLOGY REPORT*  Clinical Data: Cough, congestion and shortness of breath  CHEST - 2 VIEW  Comparison: 02/27/2012 and prior chest radiographs a  Findings: The cardiomediastinal  silhouette is unremarkable. This is a mildly low volume film. Left basilar opacity identified primarily on the lateral view is noted - suspect atelectasis but airspace disease/pneumonia is not excluded. There is no evidence of pleural effusion or pneumothorax. No acute bony abnormalities identified.  IMPRESSION: Left lower lobe opacity - suspect atelectasis but pneumonia is not excluded.   Original Report Authenticated By: Harmon Pier, M.D.    Ct Angio Chest Pe W/cm &/or Wo Cm  04/03/2012  *RADIOLOGY REPORT*  Clinical Data: Hypoxia, cough  CT ANGIOGRAPHY CHEST  Technique:  Multidetector CT imaging of the chest using the standard protocol during bolus administration of intravenous contrast. Multiplanar reconstructed images including MIPs were obtained and reviewed to evaluate the vascular anatomy.  Contrast: OMNIPAQUE IOHEXOL 350 MG/ML SOLN  Comparison: 04/03/2012, 04/24/2004  Findings: Enlarged left lobe of the thyroid gland, displaces the trachea rightward.  Nondiagnostic evaluation for pulmonary embolism secondary to contrast bolus timing, respiratory motion, and streak artifact from extremity positioning.  Normal caliber aorta.  Normal heart size.  No pleural or pericardial effusion.  Small hiatal hernia.  No intrathoracic lymphadenopathy.  Central airways are patent.  Bibasilar atelectasis.  No pneumothorax.  Detailed parenchymal evaluation is degraded by respiratory motion.  Limited images through the upper abdomen show no acute finding.  No acute osseous finding.  IMPRESSION: Nondiagnostic for pulmonary embolism. Recommend repeat examination or VQ scan if clinical concern persists.  Bibasilar atelectasis.  Enlarged left lobe of the thyroid gland.  This has considerably enlarged since 2006.  Recommend thyroid ultrasound.   Original Report Authenticated By: Jearld Lesch, M.D.    US Soft Tissue Head/neck  04/03/2012  *RADIOLOGY REPORT*  Clinical Data: Enlarged thyroid.  THYROID ULTRASOUND   Technique: Ultrasound examination of the thyroid gland and adjacent soft tissues was performed.  Comparison:  None.  Findings:  Right thyroid lobe:  3.4 x 1.0 x 1.2 cm. Left thyroid lobe:  5.6 x 3.8 x 4.7 cm. Isthmus:  4 mm.  Focal nodules:  There is suspicion for a large solid mass replacing much of the left thyroid lobe. No focal nodule on the right.  Lymphadenopathy:  None visualized.  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  Recommend tissue sampling.   Original Report Authenticated By: Charlett Nose, M.D.    Nm Pulmonary Perf And Vent  04/03/2012  *RADIOLOGY REPORT*  Clinical Data: Faven Watterson of breath.  Pulmonary embolus.  NM PULMONARY VENTILATION AND PERFUSION SCAN  Radiopharmaceutical: 6.6MILLI CURIE MAA TECHNETIUM TO 57M ALBUMIN AGGREGATED intravenous injection.  41 mCi technetium 99 DTPA inhalation.  Comparison: Chest radiograph 04/03/2012.  Findings: The ventilation study is normal.  Perfusion study is also normal.  No defects.  IMPRESSION: Normal study.   Original Report Authenticated By: Andreas Newport, M.D.     Scheduled Meds:   . aspirin EC  81 mg Oral Daily  . azelastine  1 spray Each Nare BID  . baclofen  10 mg Oral QID  . budesonide-formoterol  1 puff Inhalation BID  . cefTRIAXone (ROCEPHIN)  IV  1 g Intravenous Q24H  . citalopram  40 mg Oral QHS  . clonazePAM  1 mg Oral BID  . cromolyn  1 drop Both Eyes QID  . enoxaparin (LOVENOX) injection  40 mg Subcutaneous Q24H  . lamoTRIgine  100 mg Oral BID  . levETIRAcetam  250 mg Oral Q12H  . levothyroxine  25 mcg Oral QAC breakfast  . loratadine  10 mg Oral Daily  . montelukast  10 mg Oral q morning - 10a  . risperiDONE  2 mg Oral BID  . risperiDONE microspheres  25 mg Intramuscular Q14 Days  . simvastatin  20 mg Oral q1800  . sodium chloride  1 spray Each Nare BID  . sodium chloride  3 mL Intravenous Q12H  . venlafaxine XR  150 mg Oral Q breakfast   Continuous Infusions:    . sodium chloride 50 mL/hr at 04/03/12 1055    Principal Problem:  *Shortness of breath Active Problems:  Cerebral palsy  Nausea vomiting and diarrhea  UTI (lower urinary tract infection)    Time spent: 30 min    Juanito Gonyer, [facility]  [facility] Pager [phone]. If 8PM-8AM, please contact night-coverage at [email or web address], password [facility] 04/03/2012, 4:41 PM  LOS: 0 days

## 2012-04-04 ENCOUNTER — Ambulatory Visit (HOSPITAL_COMMUNITY): Payer: Medicare Other

## 2012-04-04 ENCOUNTER — Inpatient Hospital Stay (HOSPITAL_COMMUNITY): Payer: Medicare Other

## 2012-04-04 ENCOUNTER — Encounter (HOSPITAL_COMMUNITY): Payer: Self-pay | Admitting: Radiology

## 2012-04-04 DIAGNOSIS — R112 Nausea with vomiting, unspecified: Secondary | ICD-10-CM

## 2012-04-04 DIAGNOSIS — R0902 Hypoxemia: Secondary | ICD-10-CM

## 2012-04-04 DIAGNOSIS — R197 Diarrhea, unspecified: Secondary | ICD-10-CM

## 2012-04-04 LAB — BASIC METABOLIC PANEL
BUN: 9 mg/dL (ref 6–23)
Creatinine, Ser: 0.58 mg/dL (ref 0.50–1.10)
GFR calc Af Amer: >90 mL/min (ref >90)
GFR calc non Af Amer: >90 mL/min (ref >90)

## 2012-04-04 LAB — CBC
HCT: 39.6 % (ref 36.0–46.0)
MCHC: 32.1 g/dL (ref 30.0–36.0)
RDW: 14.7 % (ref 11.5–15.5)

## 2012-04-04 LAB — CLOSTRIDIUM DIFFICILE BY PCR: Toxigenic C. Difficile by PCR: NEGATIVE

## 2012-04-04 MED ORDER — ALBUTEROL SULFATE HFA 108 (90 BASE) MCG/ACT IN AERS
2.0000 | INHALATION_SPRAY | Freq: Three times a day (TID) | RESPIRATORY_TRACT | Status: DC
  Administered 2012-04-04 – 2012-04-07 (×10): 2 via RESPIRATORY_TRACT
  Filled 2012-04-04: qty 6.7

## 2012-04-04 MED ORDER — POTASSIUM CITRATE ER 10 MEQ (1080 MG) PO TBCR
10.0000 meq | EXTENDED_RELEASE_TABLET | Freq: Three times a day (TID) | ORAL | Status: DC
  Administered 2012-04-04 – 2012-04-07 (×8): 10 meq via ORAL
  Filled 2012-04-04 (×13): qty 1

## 2012-04-04 NOTE — Progress Notes (Signed)
OT Screen:  Patient Details Name: Kristi Perry MRN: [id number] DOB: 1965/09/30   Cancelled Treatment:    Reason Eval/Treat Not Completed: Other (comment) (  Received order for bil wrist splints.  Pt has bil prefabricated splints on, which RN states she came with)  Jazara Swiney 04/04/2012, 12:42 PM Marica Otter, OTR/L [phone] 04/04/2012

## 2012-04-04 NOTE — Progress Notes (Addendum)
[facility] PROGRESS NOTE  Kristi Perry ZOX:[id number] DOB: 03-30-1965 DOA: 04/03/2012 PCP: Karlene Einstein, MD  Assessment/Plan  #1. Shortness of breath with acute hypoxic respiratory failure, now resolved:  CXR demonstrated possible pneumonia, but CTa was consistent with atelectasis.  CTa was nondiagnostic due to contrast timing, but VQ scan was negative.   -  Schedule albuterol treatments with inhalers to encourage deep breathing and prevent asthma exacerbation  -  Incentive spirometry -  troponins neg  #2. Nausea vomiting and diarrhea - tolerating some food and no diarrhea since yesterday AM -  C. difficile and cultures not obtained as no BMs since admission  #3. UTI - history of renal stones requiring stent and also nephrostomy tube placement.  Has staghorn calculi, but was not visualized due to contrast obscuring view on CT today.  Had MDR E. Coli UTI about one month ago -  Continue zosyn (last culture was sensitive)  -  F/u urine culture (GNR > 100000 colonies -  CT scan demonstrates small kidney stones, max 4mm, inferior pole of right kidney -  Discussed case with Dr. Page Spiro who recommended potassium citrate TID and after completion of treatment of UTI, to start suppression with nitrofurantoin with meals.   -  If sick, last E. Coli was sensitive to tobramycin, gentamicin, and mactrobid.  Not tested against penems.    #4. Bronchial asthma - presently not wheezing.  -  Schedule albuterol for now  -  Continue symbicort and singulair  #5. Hypothyroidism - TSH wnl -  Continue synthroid  Thyroid mass of left lobe -  US guided biopsy of mass either on Monday if inpatient or next week as outpatient if discharged -  fT4 1.13  #6. History of depression and entitled personality disorder- continue present medications.  -  Continue clonazepam -  Cont effexor XR -  Continue citalopgram -  Continue risperidone  #7. History of epilepsy, no current seizures -  Cont  lamotrigine  Significant weight gain over the last year making it difficult to perform cares -  Nutrition consultation  Diet:  Dysphagia 3 witih thin liquids  Access:  PIV IVF:  OFF Proph:  lovenox  Code Status: full code Family Communication: spoke with patient and also her mother vicky Strine via phone Disposition Plan: pending results of urine culture, plan from Urology regarding stones and question about recurrent UTI.  Patient would now like to go to a different SNF and is requesting social work consultation.     Consultants:  none  Procedures:  CTa chest 1/31  CT a/p 1/31 (unable to detect kidney stones due to residual contrast from CTa chest)  VQ 1/31  US neck 1/31  Antibiotics:  Ceftriaxone 1/31 >> 1/31  Zosyn 1/31 >>    HPI/Subjective:  Patient weaned to room air.  Has some nausea, but denies vomiting since yesterday morning.  No diarrhea or other BM.  Denies chest pain and discomfort.  Has some mild headache and abdominal bloating.    Objective: Filed Vitals:   04/03/12 2044 04/03/12 2201 04/04/12 0541 04/04/12 0900  BP:  134/68 114/49   Pulse:  74 94   Temp:  99.5 F (37.5 C) 98.2 F (36.8 C)   TempSrc:  Oral Oral   Resp:  18 19   Height:      Weight:      SpO2: 96% 97% 98% 98%    Intake/Output Summary (Last 24 hours) at 04/04/12 1113 Last data filed at 04/04/12 0900  Gross per 24 hour  Intake 1771.67 ml  Output   1600 ml  Net 171.67 ml   Filed Weights   04/03/12 0857  Weight: 98 kg (216 lb 0.8 oz)    Exam:   General:  Obese CF, no acute distress, slouched over in bed  HEENT:  Dysconjugate gaze  Cardiovascular:  RRR, no mrg, 2+ pulses  Respiratory:  Diminished bilateral breath sounds at bases, no wheezes, rales, or rhonchi  Abdomen:  NABS, soft, nondistended, nontender  MSK:  Decreased muscle tone and bulk, particularly lower extremities, 1+ pitting edema, externally rotated legs.    Neuro:  No lower extremity strength,  Upper  extremities 4/5 proximal strength, 5-/5 grip strength.    Data Reviewed: Basic Metabolic Panel:  Lab 04/04/12 1610 04/03/12 0937 04/03/12 0130  NA 138 135 134*  K 4.2 3.9 3.8  CL 103 100 98  CO2 28 26 25   GLUCOSE 114* 88 105*  BUN 9 10 12   CREATININE 0.58 0.53 0.51  CALCIUM 8.7 8.6 9.2  MG -- -- --  PHOS -- -- --   Liver Function Tests:  Lab 04/03/12 0937 04/03/12 0130  AST 18 18  ALT 24 27  ALKPHOS 55 54  BILITOT 0.3 0.2*  PROT 6.0 6.3  ALBUMIN 3.4* 3.6    Lab 04/03/12 0937  LIPASE 36  AMYLASE --   No results found for this basename: AMMONIA:5 in the last 168 hours CBC:  Lab 04/04/12 0510 04/03/12 0937 04/03/12 0130  WBC 7.3 6.7 8.2  NEUTROABS -- -- 4.7  HGB 12.7 12.8 13.4  HCT 39.6 38.6 40.4  MCV 92.7 91.9 91.2  PLT 160 148* 148*   Cardiac Enzymes:  Lab 04/03/12 1523 04/03/12 0937  CKTOTAL -- --  CKMB -- --  CKMBINDEX -- --  TROPONINI <0.30 <0.30   BNP (last 3 results)  Basename 04/03/12 0937  PROBNP <5.0   CBG: No results found for this basename: GLUCAP:5 in the last 168 hours  Recent Results (from the past 240 hour(s))  URINE CULTURE     Status: Normal (Preliminary result)   Collection Time   04/03/12  1:31 AM      Component Value Range Status Comment   Specimen Description URINE, CATHETERIZED   Final    Special Requests NONE   Final    Culture  Setup Time 04/03/2012 09:17   Final    Colony Count >=100,000 COLONIES/ML   Final    Culture GRAM NEGATIVE RODS   Final    Report Status PENDING   Incomplete   MRSA PCR SCREENING     Status: Normal   Collection Time   04/03/12  9:28 AM      Component Value Range Status Comment   MRSA by PCR NEGATIVE  NEGATIVE Final      Studies: Ct Abdomen Pelvis Wo Contrast  04/04/2012  *RADIOLOGY REPORT*  Clinical Data: Evaluate for nephrolithiasis, history of complicated urinary tract infection and prior nephrolithotomy  CT ABDOMEN AND PELVIS WITHOUT CONTRAST  Technique:  Multidetector CT imaging of the  abdomen and pelvis was performed following the standard protocol without intravenous contrast.  Comparison: CT abdomen pelvis - 04/03/2012; 10/18/2011; 08/20/2011; right-sided percutaneous nephrolithotomy access - 07/30/2013abdominal radiograph - 04/04/2012  Findings:  The lack of intravenous contrast limits the ability to evaluate solid abdominal organs.  There is no retained excreted contrast within either collecting system, ureter or the urinary bladder.  There has been significant interval reduction of previously noted right-sided staghorn  calculi burden.  There are 3 persistent nonobstructing stones within the inferior pole of the right kidney with dominant stone measuring approximately 4 mm in diameter (image 41, series 2).  No left-sided nephrolithiasis.  No stones are seen within the expected location of either ureter or the urinary bladder.  A Foley catheter and small amount of air is seen within a decompressed urinary bladder.  Multiple phleboliths overlie the left hemipelvis.  No urinary obstruction or perinephric stranding.  Normal hepatic contour.  Normal noncontrast appearance of the gallbladder.  No ascites.  Normal noncontrast appearance of bilateral adrenal glands, pancreas and spleen.  Colonic diverticulosis without evidence of diverticulitis on this noncontrast examination.  The bowel is otherwise normal in course and caliber without wall thickening or evidence of obstruction. Normal noncontrast appearance of the appendix.  No pneumoperitoneum, pneumatosis or portal venous gas.  Normal caliber of the abdominal aorta.  Scattered retroperitoneal lymph nodes are not enlarged by CT criteria.  No retroperitoneal, mesenteric, pelvic or inguinal lymphadenopathy.  There is a small amount of presumably physiologic fluid within the endometrial canal. Decreased conspicuity of left-sided adnexal lesion.  No discrete adnexal lesion.  No free fluid in the pelvis. Note is made of prominent varicosities about the  lower pelvis and labia.  Limited visualization of the lower thorax is degraded secondary to patient respiratory artifact.  Grossly unchanged minimal bibasilar ground-glass atelectasis.  No focal airspace opacities.  No definite pleural effusion.  Normal heart size.  A trace amount of pericardial fluid, presumably physiologic.  No acute or aggressive osseous abnormalities. Unchanged mild (<25%) compression deformity of the superior endplate of L4.  Post ORIF of the right greater trochanter.  IMPRESSION: 1.  Interval complete clearance of previously noted excreted contrast. 2.  Significant reduction in previously noted right-sided staghorn calculi with 3 tiny residual nonobstructing right-sided renal stones remaining, dominant stone measuring 4 mm in diameter.  No left-sided nephrolithiasis.  No urinary obstruction. 3.  Urinary bladder decompressed with a Foley catheter. 4.  Colonic diverticulosis without evidence of diverticulitis.   Original Report Authenticated By: Tacey Ruiz, MD    Ct Abdomen Pelvis Wo Contrast  04/03/2012  *RADIOLOGY REPORT*  Clinical Data: Hydronephrosis.  History of nephrolithotomy.  Chest pain.  CT ABDOMEN AND PELVIS WITHOUT CONTRAST  Technique:  Multidetector CT imaging of the abdomen and pelvis was performed following the standard protocol without intravenous contrast.  Comparison: 04/03/2012  Findings: Linear subsegmental atelectasis noted in both lower lobes. Technical factors related to patient body habitus reduce diagnostic sensitivity and specificity.  The visualized portion of the liver, spleen, pancreas, and adrenal glands appear unremarkable in noncontrast CT appearance.  Contrast medium administered with the patient's recent chest CT is present throughout the renal collecting systems and ureters.  The patient has known history of right-sided calculi, but any nonobstructive calculi in the right collecting system are obscured by the surrounding contrast which is of similar  density.  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  No hydronephrosis observed.  Small retroperitoneal lymph nodes are not pathologically enlarged by size criteria.  Scattered diverticula of the descending and sigmoid colon noted.  A hypodense 2.8 x 2.4 cm left adnexal cystic lesion is present. No pathologic pelvic adenopathy is identified.  Exaggerated lumbar lordosis noted.  Chronic superior endplate can cavity at the L4 vertebra is present.  There is a segmental S1 vertebra.  Grade 1 posterior subluxation at L5-S1 noted  Left iliopsoas atrophy is stable from prior exam. Despite efforts by the patient and technologist, motion artifact is present on some series of today's examination and could not be totally eliminated. This reduces diagnostic sensitivity and specificity.  IMPRESSION:  1.  No hydronephrosis or hydroureter.  I cannot exclude nonobstructive calculi due to the presence of contrast in the collecting systems.  There is a small calyceal diverticulum of the right kidney upper pole subtending right upper pole scarring. 2.  Linear subsegmental atelectasis in both lower lobes. 3.  Scattered sigmoid and descending colon diverticula. 4.  Cystic left adnexal/ovarian lesion measures up to 2.8 cm in size.  This is new compared to 11/28/2011.  Probably simply represents an ovarian cyst.  Given its size and to the patient's expected premenopausal status, no further workup is required.  This recommendation follows ACR consensus guidelines:  White Paper of the ACR Incidental Findings Committee II on Adnexal Findings.  J Am Coll Radiol 2013:10:675-681. 5.  Exaggerated lumbar lordosis.   Original Report Authenticated By: Gaylyn Rong, M.D.    Dg Chest 2 View  04/03/2012  *RADIOLOGY REPORT*  Clinical Data: Cough, congestion and shortness of breath  CHEST - 2 VIEW  Comparison: 02/27/2012 and prior chest radiographs a  Findings: The cardiomediastinal  silhouette is unremarkable. This is a mildly low volume film. Left basilar opacity identified primarily on the lateral view is noted - suspect atelectasis but airspace disease/pneumonia is not excluded. There is no evidence of pleural effusion or pneumothorax. No acute bony abnormalities identified.  IMPRESSION: Left lower lobe opacity - suspect atelectasis but pneumonia is not excluded.   Original Report Authenticated By: Harmon Pier, M.D.    Dg Abd 1 View  04/04/2012  *RADIOLOGY REPORT*  Clinical Data: Evaluate for residual contrast  ABDOMEN - 1 VIEW  Comparison: CT abdomen 04/03/2012  Findings: Single abdominal KUB.  No residual excreted contrast material in the renal collecting systems or bladder.  Nonobstructed bowel gas pattern.  Multiple vascular phleboliths project over the anatomic pelvis.  No acute osseous abnormality.  IMPRESSION: No residual excreted contrast material in the renal collecting systems or bladder.   Original Report Authenticated By: Malachy Moan, M.D.    Ct Angio Chest Pe W/cm &/or Wo Cm  04/03/2012  *RADIOLOGY REPORT*  Clinical Data: Hypoxia, cough  CT ANGIOGRAPHY CHEST  Technique:  Multidetector CT imaging of the chest using the standard protocol during bolus administration of intravenous contrast. Multiplanar reconstructed images including MIPs were obtained and reviewed to evaluate the vascular anatomy.  Contrast: OMNIPAQUE IOHEXOL 350 MG/ML SOLN  Comparison: 04/03/2012, 04/24/2004  Findings: Enlarged left lobe of the thyroid gland, displaces the trachea rightward.  Nondiagnostic evaluation for pulmonary embolism secondary to contrast bolus timing, respiratory motion, and streak artifact from extremity positioning.  Normal caliber aorta.  Normal heart size.  No pleural or pericardial effusion.  Small hiatal hernia.  No intrathoracic lymphadenopathy.  Central airways are patent.  Bibasilar atelectasis.  No pneumothorax.  Detailed parenchymal evaluation is degraded by  respiratory motion.  Limited images through the upper abdomen show no acute finding.  No acute osseous finding.  IMPRESSION: Nondiagnostic for pulmonary embolism. Recommend repeat examination or VQ scan if clinical concern persists.  Bibasilar atelectasis.  Enlarged left lobe of the thyroid gland.  This has considerably enlarged since 2006.  Recommend thyroid ultrasound.   Original Report Authenticated By: Jearld Lesch, M.D.    US Soft Tissue Head/neck  04/03/2012  *RADIOLOGY REPORT*  Clinical Data: Enlarged  thyroid.  THYROID ULTRASOUND  Technique: Ultrasound examination of the thyroid gland and adjacent soft tissues was performed.  Comparison:  None.  Findings:  Right thyroid lobe:  3.4 x 1.0 x 1.2 cm. Left thyroid lobe:  5.6 x 3.8 x 4.7 cm. Isthmus:  4 mm.  Focal nodules:  There is suspicion for a large solid mass replacing much of the left thyroid lobe. No focal nodule on the right.  Lymphadenopathy:  None visualized.  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  Recommend tissue sampling.   Original Report Authenticated By: Charlett Nose, M.D.    Nm Pulmonary Perf And Vent  04/03/2012  *RADIOLOGY REPORT*  Clinical Data: Aslan Himes of breath.  Pulmonary embolus.  NM PULMONARY VENTILATION AND PERFUSION SCAN  Radiopharmaceutical: 6.6MILLI CURIE MAA TECHNETIUM TO 80M ALBUMIN AGGREGATED intravenous injection.  41 mCi technetium 99 DTPA inhalation.  Comparison: Chest radiograph 04/03/2012.  Findings: The ventilation study is normal.  Perfusion study is also normal.  No defects.  IMPRESSION: Normal study.   Original Report Authenticated By: Andreas Newport, M.D.     Scheduled Meds:    . albuterol  2 puff Inhalation TID  . aspirin EC  81 mg Oral Daily  . azelastine  1 spray Each Nare BID  . baclofen  10 mg Oral QID  . budesonide-formoterol  1 puff Inhalation BID  . citalopram  40 mg Oral QHS  . clonazePAM  1 mg Oral BID  . cromolyn  1 drop Both Eyes  QID  . enoxaparin (LOVENOX) injection  40 mg Subcutaneous Q24H  . lamoTRIgine  100 mg Oral BID  . levETIRAcetam  250 mg Oral Q12H  . levothyroxine  25 mcg Oral QAC breakfast  . loratadine  10 mg Oral Daily  . montelukast  10 mg Oral q morning - 10a  . piperacillin-tazobactam (ZOSYN)  IV  3.375 g Intravenous Q8H  . risperiDONE  2 mg Oral BID  . risperiDONE microspheres  25 mg Intramuscular Q14 Days  . simvastatin  20 mg Oral q1800  . sodium chloride  1 spray Each Nare BID  . sodium chloride  3 mL Intravenous Q12H  . venlafaxine XR  150 mg Oral Q breakfast   Continuous Infusions:    . sodium chloride 50 mL/hr at 04/03/12 1055    Principal Problem:  *Shortness of breath Active Problems:  Cerebral palsy  Nausea vomiting and diarrhea  UTI (lower urinary tract infection)    Time spent: 30 min    Wyn Nettle, [employer] [employer]  [facility] Pager [phone]. If 8PM-8AM, please contact night-coverage at [email or web address], password [facility] 04/04/2012, 11:13 AM  LOS: 1 day

## 2012-04-05 ENCOUNTER — Encounter (HOSPITAL_COMMUNITY): Payer: Self-pay | Admitting: Internal Medicine

## 2012-04-05 DIAGNOSIS — N200 Calculus of kidney: Secondary | ICD-10-CM

## 2012-04-05 HISTORY — DX: Calculus of kidney: N20.0

## 2012-04-05 MED ORDER — ENOXAPARIN SODIUM 40 MG/0.4ML ~~LOC~~ SOLN: 40.0000 mg | SUBCUTANEOUS | Status: DC

## 2012-04-05 MED ORDER — NYSTATIN 100000 UNIT/GM EX POWD
Freq: Two times a day (BID) | CUTANEOUS | Status: DC
  Administered 2012-04-05 – 2012-04-07 (×5): via TOPICAL
  Filled 2012-04-05: qty 15

## 2012-04-05 MED ORDER — ENOXAPARIN SODIUM 60 MG/0.6ML ~~LOC~~ SOLN
50.0000 mg | SUBCUTANEOUS | Status: DC
  Administered 2012-04-07: 50 mg via SUBCUTANEOUS
  Filled 2012-04-05: qty 0.6

## 2012-04-05 NOTE — Plan of Care (Signed)
Problem: Consults Goal: Skin Care Protocol Initiated - if Braden Score 18 or less If consults are not indicated, leave blank or document N/A  Outcome: Progressing Allevyn to bilateral Buttocks wounds stage1  on right & stage 2 healing wound on left, Has a yeast like redness to bilateral groins under breast & abd folds, Microguard powder used after cleansing skin prn & with baths daily  Problem: ICU Phase Progression Outcomes Goal: Pain controlled with appropriate interventions Outcome: Not Applicable Date Met:  04/05/12 Pt has chronic pain syndrome  Problem: Phase I Progression Outcomes Goal: Progress activity as tolerated unless otherwise ordered Outcome: Not Applicable Date Met:  04/05/12 Total care pt; log roll q2hrs & position with pillows, w/heels floated & HOB elevated 30*

## 2012-04-05 NOTE — Progress Notes (Signed)
[facility] PROGRESS NOTE  Gisel Vipond Dadisman VWU:[id number] DOB: 10/11/1965 DOA: 04/03/2012 PCP: Karlene Einstein, MD  Assessment/Plan  #1. Shortness of breath with acute hypoxic respiratory failure, now resolved:  CXR demonstrated possible pneumonia, but CTa was consistent with atelectasis.  CTa was nondiagnostic due to contrast timing, but VQ scan was negative.   -  Schedule albuterol treatments with inhalers to encourage deep breathing and prevent asthma exacerbation  -  Incentive spirometry -  troponins neg  #2. Nausea vomiting and diarrhea - ate well yesterday -  C. difficile neg -  F/u stool culture  #3. UTI - history of renal stones requiring stent and also nephrostomy tube placement.  Has staghorn calculi, but was not visualized due to contrast obscuring view on CT today.  Had MDR E. Coli UTI about one month ago -  Continue zosyn (last culture was sensitive)  -  F/u urine culture (GNR > 100000 colonies) -  CT scan demonstrates small kidney stones, max 4mm, inferior pole of right kidney -  Discussed case with Dr. Page Spiro who recommended potassium citrate TID and after completion of treatment of UTI, to start suppression with nitrofurantoin with meals.   -  If sick, last E. Coli was sensitive to tobramycin, gentamicin, and mactrobid.  Not tested against penems.    #4. Bronchial asthma - presently not wheezing.  -  Schedule albuterol for now  -  Continue symbicort and singulair  #5. Hypothyroidism - TSH wnl -  Continue synthroid  Thyroid mass of left lobe -  US guided biopsy of mass on Monday -  TSH wnl, fT4 1.13  #6. History of depression and entitled personality disorder- continue present medications.  -  Continue clonazepam -  Cont effexor XR -  Continue citalopram -  Continue risperidone  #7. History of epilepsy, no current seizures -  Cont lamotrigine  Significant weight gain over the last year making it difficult to perform cares -  Nutrition  consultation  Diet:  Dysphagia 3 witih thin liquids  Access:  PIV IVF:  OFF Proph:  lovenox  Code Status: full code Family Communication: spoke with patient today and her mother vicky Bresee via phone Disposition Plan: pending results of urine culture and thyroid biopsy.  Patient would now like to go to a different SNF and is requesting social work consultation.     Consultants:  none  Procedures:  CTa chest 1/31  CT a/p 1/31 (unable to detect kidney stones due to residual contrast from CTa chest)  VQ 1/31  US neck 1/31  Antibiotics:  Ceftriaxone 1/31 >> 1/31  Zosyn 1/31 >>    HPI/Subjective:  Patient weaned to room air, but states she still feels mildly Vitoria Conyer of breath and has a cough.  Has some nausea but no vomiting or diarrhea.  Denies chest pain and discomfort.  Denies fevers, chills.    Objective: Filed Vitals:   04/04/12 2004 04/04/12 2225 04/05/12 0511 04/05/12 0802  BP:  124/69 135/54   Pulse:  98 90   Temp:  98.8 F (37.1 C) 98.6 F (37 C)   TempSrc:  Oral Oral   Resp:  18 20   Height:      Weight:      SpO2: 91% 92% 95% 98%    Intake/Output Summary (Last 24 hours) at 04/05/12 1255 Last data filed at 04/05/12 1100  Gross per 24 hour  Intake   1210 ml  Output    750 ml  Net  460 ml   Filed Weights   04/03/12 0857  Weight: 98 kg (216 lb 0.8 oz)    Exam:   General:  Obese CF, no acute distress, sitting up being fed breakfast  HEENT:  Dysconjugate gaze  Cardiovascular:  RRR, no mrg, 2+ pulses  Respiratory:  Diminished bilateral breath sounds at bases, no wheezes, rales, or rhonchi  Abdomen:  NABS, soft, nondistended, nontender  MSK:  Decreased muscle tone and bulk, particularly lower extremities, 1+ pitting edema, externally rotated legs.    Neuro:  Minimal lower extremity strength,  Upper extremities 4/5 proximal strength, 5-/5 grip strength.    Data Reviewed: Basic Metabolic Panel:  Lab 04/04/12 1610 04/03/12 0937 04/03/12  0130  NA 138 135 134*  K 4.2 3.9 3.8  CL 103 100 98  CO2 28 26 25   GLUCOSE 114* 88 105*  BUN 9 10 12   CREATININE 0.58 0.53 0.51  CALCIUM 8.7 8.6 9.2  MG -- -- --  PHOS -- -- --   Liver Function Tests:  Lab 04/03/12 0937 04/03/12 0130  AST 18 18  ALT 24 27  ALKPHOS 55 54  BILITOT 0.3 0.2*  PROT 6.0 6.3  ALBUMIN 3.4* 3.6    Lab 04/03/12 0937  LIPASE 36  AMYLASE --   No results found for this basename: AMMONIA:5 in the last 168 hours CBC:  Lab 04/04/12 0510 04/03/12 0937 04/03/12 0130  WBC 7.3 6.7 8.2  NEUTROABS -- -- 4.7  HGB 12.7 12.8 13.4  HCT 39.6 38.6 40.4  MCV 92.7 91.9 91.2  PLT 160 148* 148*   Cardiac Enzymes:  Lab 04/03/12 1523 04/03/12 0937  CKTOTAL -- --  CKMB -- --  CKMBINDEX -- --  TROPONINI <0.30 <0.30   BNP (last 3 results)  Basename 04/03/12 0937  PROBNP <5.0   CBG: No results found for this basename: GLUCAP:5 in the last 168 hours  Recent Results (from the past 240 hour(s))  URINE CULTURE     Status: Normal (Preliminary result)   Collection Time   04/03/12  1:31 AM      Component Value Range Status Comment   Specimen Description URINE, CATHETERIZED   Final    Special Requests NONE   Final    Culture  Setup Time 04/03/2012 09:17   Final    Colony Count >=100,000 COLONIES/ML   Final    Culture GRAM NEGATIVE RODS   Final    Report Status PENDING   Incomplete   MRSA PCR SCREENING     Status: Normal   Collection Time   04/03/12  9:28 AM      Component Value Range Status Comment   MRSA by PCR NEGATIVE  NEGATIVE Final   CLOSTRIDIUM DIFFICILE BY PCR     Status: Normal   Collection Time   04/04/12  1:16 AM      Component Value Range Status Comment   C difficile by pcr NEGATIVE  NEGATIVE Final      Studies: Ct Abdomen Pelvis Wo Contrast  04/04/2012  *RADIOLOGY REPORT*  Clinical Data: Evaluate for nephrolithiasis, history of complicated urinary tract infection and prior nephrolithotomy  CT ABDOMEN AND PELVIS WITHOUT CONTRAST  Technique:   Multidetector CT imaging of the abdomen and pelvis was performed following the standard protocol without intravenous contrast.  Comparison: CT abdomen pelvis - 04/03/2012; 10/18/2011; 08/20/2011; right-sided percutaneous nephrolithotomy access - 07/30/2013abdominal radiograph - 04/04/2012  Findings:  The lack of intravenous contrast limits the ability to evaluate solid abdominal organs.  There is no retained excreted contrast within either collecting system, ureter or the urinary bladder.  There has been significant interval reduction of previously noted right-sided staghorn calculi burden.  There are 3 persistent nonobstructing stones within the inferior pole of the right kidney with dominant stone measuring approximately 4 mm in diameter (image 41, series 2).  No left-sided nephrolithiasis.  No stones are seen within the expected location of either ureter or the urinary bladder.  A Foley catheter and small amount of air is seen within a decompressed urinary bladder.  Multiple phleboliths overlie the left hemipelvis.  No urinary obstruction or perinephric stranding.  Normal hepatic contour.  Normal noncontrast appearance of the gallbladder.  No ascites.  Normal noncontrast appearance of bilateral adrenal glands, pancreas and spleen.  Colonic diverticulosis without evidence of diverticulitis on this noncontrast examination.  The bowel is otherwise normal in course and caliber without wall thickening or evidence of obstruction. Normal noncontrast appearance of the appendix.  No pneumoperitoneum, pneumatosis or portal venous gas.  Normal caliber of the abdominal aorta.  Scattered retroperitoneal lymph nodes are not enlarged by CT criteria.  No retroperitoneal, mesenteric, pelvic or inguinal lymphadenopathy.  There is a small amount of presumably physiologic fluid within the endometrial canal. Decreased conspicuity of left-sided adnexal lesion.  No discrete adnexal lesion.  No free fluid in the pelvis. Note is made of  prominent varicosities about the lower pelvis and labia.  Limited visualization of the lower thorax is degraded secondary to patient respiratory artifact.  Grossly unchanged minimal bibasilar ground-glass atelectasis.  No focal airspace opacities.  No definite pleural effusion.  Normal heart size.  A trace amount of pericardial fluid, presumably physiologic.  No acute or aggressive osseous abnormalities. Unchanged mild (<25%) compression deformity of the superior endplate of L4.  Post ORIF of the right greater trochanter.  IMPRESSION: 1.  Interval complete clearance of previously noted excreted contrast. 2.  Significant reduction in previously noted right-sided staghorn calculi with 3 tiny residual nonobstructing right-sided renal stones remaining, dominant stone measuring 4 mm in diameter.  No left-sided nephrolithiasis.  No urinary obstruction. 3.  Urinary bladder decompressed with a Foley catheter. 4.  Colonic diverticulosis without evidence of diverticulitis.   Original Report Authenticated By: Tacey Ruiz, MD    Dg Abd 1 View  04/04/2012  *RADIOLOGY REPORT*  Clinical Data: Evaluate for residual contrast  ABDOMEN - 1 VIEW  Comparison: CT abdomen 04/03/2012  Findings: Single abdominal KUB.  No residual excreted contrast material in the renal collecting systems or bladder.  Nonobstructed bowel gas pattern.  Multiple vascular phleboliths project over the anatomic pelvis.  No acute osseous abnormality.  IMPRESSION: No residual excreted contrast material in the renal collecting systems or bladder.   Original Report Authenticated By: Malachy Moan, M.D.    US Soft Tissue Head/neck  04/03/2012  *RADIOLOGY REPORT*  Clinical Data: Enlarged thyroid.  THYROID ULTRASOUND  Technique: Ultrasound examination of the thyroid gland and adjacent soft tissues was performed.  Comparison:  None.  Findings:  Right thyroid lobe:  3.4 x 1.0 x 1.2 cm. Left thyroid lobe:  5.6 x 3.8 x 4.7 cm. Isthmus:  4 mm.  Focal nodules:  There  is suspicion for a large solid mass replacing much of the left thyroid lobe. No focal nodule on the right.  Lymphadenopathy:  None visualized.  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  Recommend tissue sampling.   Original Report Authenticated By: Charlett Nose,  M.D.    Nm Pulmonary Perf And Vent  04/03/2012  *RADIOLOGY REPORT*  Clinical Data: Karter Hellmer of breath.  Pulmonary embolus.  NM PULMONARY VENTILATION AND PERFUSION SCAN  Radiopharmaceutical: 6.6MILLI CURIE MAA TECHNETIUM TO 50M ALBUMIN AGGREGATED intravenous injection.  41 mCi technetium 99 DTPA inhalation.  Comparison: Chest radiograph 04/03/2012.  Findings: The ventilation study is normal.  Perfusion study is also normal.  No defects.  IMPRESSION: Normal study.   Original Report Authenticated By: Andreas Newport, M.D.     Scheduled Meds:    . albuterol  2 puff Inhalation TID  . aspirin EC  81 mg Oral Daily  . azelastine  1 spray Each Nare BID  . baclofen  10 mg Oral QID  . budesonide-formoterol  1 puff Inhalation BID  . citalopram  40 mg Oral QHS  . clonazePAM  1 mg Oral BID  . cromolyn  1 drop Both Eyes QID  . enoxaparin (LOVENOX) injection  50 mg Subcutaneous Q24H  . lamoTRIgine  100 mg Oral BID  . levETIRAcetam  250 mg Oral Q12H  . levothyroxine  25 mcg Oral QAC breakfast  . loratadine  10 mg Oral Daily  . montelukast  10 mg Oral q morning - 10a  . nystatin   Topical BID  . piperacillin-tazobactam (ZOSYN)  IV  3.375 g Intravenous Q8H  . potassium citrate  10 mEq Oral TID WC  . risperiDONE  2 mg Oral BID  . risperiDONE microspheres  25 mg Intramuscular Q14 Days  . simvastatin  20 mg Oral q1800  . sodium chloride  1 spray Each Nare BID  . sodium chloride  3 mL Intravenous Q12H  . venlafaxine XR  150 mg Oral Q breakfast   Continuous Infusions:    Principal Problem:  *Shortness of breath Active Problems:  Cerebral palsy  Nausea vomiting and diarrhea  UTI (lower  urinary tract infection)    Time spent: 30 min    Kamarri Fischetti, [facility]  [facility] Pager [phone]. If 7PM-7AM, please contact night-coverage at [email or web address], password [facility] 04/05/2012, 12:55 PM  LOS: 2 days

## 2012-04-05 NOTE — Progress Notes (Signed)
INITIAL NUTRITION ASSESSMENT  DOCUMENTATION CODES Per approved criteria  -Obesity Unspecified   INTERVENTION: - When patient arouseable, will provide education on healthy weight loss.  - RD to monitor for nutrition plan of care.   NUTRITION DIAGNOSIS: Obesity related to excessive energy intake as evidenced by BMI 38.3.   Goal: Patient will comprehend a general healthful diet to promote gradual weight loss.   Monitor:  PO intake, education needs, weight, labs  Reason for Assessment: Consult, assessment of nutrition status and recommendations.   47 y.o. female  Admitting Dx: Shortness of breath  ASSESSMENT: Patient admitted with nausea, vomiting, and diarrhea for the last 3-4 days. He intake is currently good with 100% meal completion. Patient has gained a significant amount of weight over the last year. Patient requires weight loss education. However, she is sleeping at this time. RD unable to wake patient. Per tech, patient has been sleeping all morning.   Height: Ht Readings from Last 1 Encounters:  04/03/12 5\' 3"  (1.6 m)    Weight: Wt Readings from Last 1 Encounters:  04/03/12 216 lb 0.8 oz (98 kg)    Ideal Body Weight: 52.3 kg  % Ideal Body Weight: 187%  Wt Readings from Last 10 Encounters:  04/03/12 216 lb 0.8 oz (98 kg)  02/27/12 214 lb 4.6 oz (97.2 kg)  12/05/11 211 lb (95.709 kg)  12/05/11 211 lb (95.709 kg)  11/07/11 206 lb 2.1 oz (93.5 kg)  11/07/11 206 lb 2.1 oz (93.5 kg)  10/07/11 206 lb 9.1 oz (93.7 kg)  10/07/11 206 lb 9.1 oz (93.7 kg)  10/07/11 206 lb 9.1 oz (93.7 kg)  08/20/11 204 lb (92.534 kg)    Usual Body Weight: Unknown  % Usual Body Weight:   BMI:  Body mass index is 38.27 kg/(m^2). Patient is obese.   Estimated Nutritional Needs: Kcal: 1900-2050 kcal Protein: 100-115 g Fluid: 2.7 L  Skin: Stage 2 ulcer, sacrum, stage 1 ulcer buttocks  Diet Order: Dysphagia 3, thin  EDUCATION NEEDS: -Education not appropriate at this  time   Intake/Output Summary (Last 24 hours) at 04/05/12 1310 Last data filed at 04/05/12 1100  Gross per 24 hour  Intake    960 ml  Output    750 ml  Net    210 ml    Last BM: 1/31   Labs:   Lab 04/04/12 0510 04/03/12 0937 04/03/12 0130  NA 138 135 134*  K 4.2 3.9 3.8  CL 103 100 98  CO2 28 26 25   BUN 9 10 12   CREATININE 0.58 0.53 0.51  CALCIUM 8.7 8.6 9.2  MG -- -- --  PHOS -- -- --  GLUCOSE 114* 88 105*    CBG (last 3)  No results found for this basename: GLUCAP:3 in the last 72 hours  Scheduled Meds:   . albuterol  2 puff Inhalation TID  . aspirin EC  81 mg Oral Daily  . azelastine  1 spray Each Nare BID  . baclofen  10 mg Oral QID  . budesonide-formoterol  1 puff Inhalation BID  . citalopram  40 mg Oral QHS  . clonazePAM  1 mg Oral BID  . cromolyn  1 drop Both Eyes QID  . enoxaparin (LOVENOX) injection  50 mg Subcutaneous Q24H  . lamoTRIgine  100 mg Oral BID  . levETIRAcetam  250 mg Oral Q12H  . levothyroxine  25 mcg Oral QAC breakfast  . loratadine  10 mg Oral Daily  . montelukast  10 mg  Oral q morning - 10a  . nystatin   Topical BID  . piperacillin-tazobactam (ZOSYN)  IV  3.375 g Intravenous Q8H  . potassium citrate  10 mEq Oral TID WC  . risperiDONE  2 mg Oral BID  . risperiDONE microspheres  25 mg Intramuscular Q14 Days  . simvastatin  20 mg Oral q1800  . sodium chloride  1 spray Each Nare BID  . sodium chloride  3 mL Intravenous Q12H  . venlafaxine XR  150 mg Oral Q breakfast    Continuous Infusions:   Past Medical History  Diagnosis Date  . Sepsis(995.91)   . Hypertension   . DVT (deep vein thrombosis) in pregnancy   . Depression   . Chronic pain syndrome   . Allergic rhinitis   . Acute respiratory failure   . Epilepsy   . Migraine   . Chronic airway obstruction   . Cerebral palsy   . Dysphagia   . Hiatal hernia   . Morbid obesity 09-25-11    requires Hoyer lift. pt. doesn't stand or ambulate.  . Incontinent of urine 09-25-11     hx.  . Incontinent of feces 09-25-11    hx.  . Vitamin D deficiency   . Sepsis(995.91)     hx of   . Hyperlipidemia   . Mild intellectual disabilities   . Neurogenic bladder   . Hypothyroidism   . Anxiety   . Unspecified psychosis   . Muscle weakness   . Dysphasia   . Pneumonia     hx of asp pneumonia 1/11-1/19/12  . Sacral decubitus ulcer     hx of  . Nephrolithiasis 04/05/2012    Past Surgical History  Procedure Date  . Nephrolithotomy 10/07/2011    Procedure: NEPHROLITHOTOMY PERCUTANEOUS;  Surgeon: Marcine Matar, MD;  Location: [facility];  Service: Urology;  Laterality: Right;  kidney   . Cystoscopy with ureteroscopy 11/07/2011    Procedure: CYSTOSCOPY WITH URETEROSCOPY;  Surgeon: Marcine Matar, MD;  Location: [facility];  Service: Urology;  Laterality: Right;  Removal of right double J stent, Insertion right double J stent  . Stone extraction with basket 11/07/2011    Procedure: STONE EXTRACTION WITH BASKET;  Surgeon: Marcine Matar, MD;  Location: [facility];  Service: Urology;  Laterality: Right;  . Nephrolithotomy 12/06/2011    Procedure: NEPHROLITHOTOMY PERCUTANEOUS;  Surgeon: Marcine Matar, MD;  Location: [facility];  Service: Urology;  Laterality: Right;   REPEAT PCNL     Linnell Fulling, RD, LDN Pager #: [phone] After-Hours Pager #: [phone]

## 2012-04-05 NOTE — Progress Notes (Signed)
Clinical Social Work Department CLINICAL SOCIAL WORK PLACEMENT NOTE 04/05/2012  Patient:  Kristi Perry, Kristi Perry  Account Number:  [id number] Admit date:  04/03/2012  Clinical Social Worker:  Leron Croak, CLINICAL SOCIAL WORKER  Date/time:  04/05/2012 04:16 PM  Clinical Social Work is seeking post-discharge placement for this patient at the following level of care:   SKILLED NURSING   (*CSW will update this form in Epic as items are completed)   04/05/2012  Patient/family provided with [facility] Department of Clinical Social Work's list of facilities offering this level of care within the geographic area requested by the patient (or if unable, by the patient's family).  04/05/2012  Patient/family informed of their freedom to choose among providers that offer the needed level of care, that participate in Medicare, Medicaid or managed care program needed by the patient, have an available bed and are willing to accept the patient.  04/05/2012  Patient/family informed of [facility]' ownership interest in [facility], as well as of the fact that they are under no obligation to receive care at this facility.  PASARR submitted to [employer] on  PASARR number received from [employer] on   FL2 transmitted to all facilities in geographic area requested by pt/family on  04/05/2012 FL2 transmitted to all facilities within larger geographic area on 04/05/2012  Patient informed that his/her managed care company has contracts with or will negotiate with  certain facilities, including the following:     Patient/family informed of bed offers received:   Patient chooses bed at  Physician recommends and patient chooses bed at    Patient to be transferred to  on   Patient to be transferred to facility by   The following physician request were entered in Epic:   Additional Comments:  Pt currently a resident of [facility] and would like to change back over to [facility] in [place].    Leron Croak, LCSWA [employer] [phone]

## 2012-04-05 NOTE — Progress Notes (Signed)
Patient ID: Kristi Perry, female   DOB: 1965-05-21, 47 y.o.   MRN: [id number]   Pt scheduled for left thyroid nodule bx 2/3 She is able to consent for herself and has done so. consent signed and in chart.  Signature not as legible because of B wrist braces  Lovenox held for 2/3

## 2012-04-05 NOTE — Progress Notes (Signed)
CSW contacted [facility] facility to informed facility that Pt was interested in coming back to facility upon d/c. CSW left message on VM of [facility] and faxed out information to facility.    Weekday CSW will followup with d/c planning.    Leron Croak, LCSWA [employer] [phone]

## 2012-04-06 ENCOUNTER — Inpatient Hospital Stay (HOSPITAL_COMMUNITY): Payer: Medicare Other

## 2012-04-06 DIAGNOSIS — N200 Calculus of kidney: Secondary | ICD-10-CM

## 2012-04-06 LAB — URINE CULTURE: Colony Count: >=100000

## 2012-04-06 MED ORDER — CIPROFLOXACIN HCL 500 MG PO TABS
500.0000 mg | ORAL_TABLET | Freq: Two times a day (BID) | ORAL | Status: DC
  Administered 2012-04-06 – 2012-04-07 (×2): 500 mg via ORAL
  Filled 2012-04-06 (×4): qty 1

## 2012-04-06 MED ORDER — FOSFOMYCIN TROMETHAMINE 3 G PO PACK
3.0000 g | PACK | ORAL | Status: DC
  Administered 2012-04-06: 3 g via ORAL
  Filled 2012-04-06: qty 3

## 2012-04-06 MED ORDER — SODIUM CHLORIDE 0.9 % IV SOLN
1.0000 g | INTRAVENOUS | Status: DC
  Filled 2012-04-06: qty 1

## 2012-04-06 NOTE — Progress Notes (Signed)
[facility] PROGRESS NOTE  Kristi Perry FAO:[id number] DOB: 1965-10-23 DOA: 04/03/2012 PCP: Karlene Einstein, MD  Assessment/Plan   UTI - history of renal stones requiring stent and also nephrostomy tube placement.  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  Had MDR E. Coli UTI about one month ago.  ESBL E. Coli and proteus.   -  S/p zosyn x 4 days for proteus -  Fosfomycin 3gm q48h x 3 doses for ESBL e. Coli  Nephrolithiasis:   -  CT scan demonstrated small kidney stones, max 4mm, inferior pole of right kidney -  Discussed case with Dr. Page Spiro who recommended potassium citrate TID and after completion of treatment of UTI, to start suppression with nitrofurantoin with meals, however, recent species have been resistant to macrobid.  Will discuss case with ID again in the AM.    #1. Shortness of breath with acute hypoxic respiratory failure, now resolved:  CXR demonstrated possible pneumonia, but CTa was consistent with atelectasis.  CTa was nondiagnostic due to contrast timing, but VQ scan was negative.   -  Schedule albuterol treatments with inhalers to encourage deep breathing and prevent asthma exacerbation  -  Incentive spirometry -  troponins neg  #2. Nausea vomiting and diarrhea - ate well yesterday -  C. difficile neg -  F/u stool culture  #4. Bronchial asthma - presently not wheezing.  -  Schedule albuterol for now  -  Continue symbicort and singulair  #5. Hypothyroidism - TSH wnl -  Continue synthroid  Thyroid mass of left lobe -  s/p US guided biopsy of mass  -  TSH wnl, fT4 1.13  #6. History of depression and entitled personality disorder- continue present medications.  -  Continue clonazepam -  Cont effexor XR -  Continue citalopram -  Continue risperidone  #7. History of epilepsy, no current seizures -  Cont lamotrigine  Significant weight gain over the last year making it  difficult to perform cares -  Nutrition consultation  Diet:  Dysphagia 3 witih thin liquids  Access:  PIV IVF:  OFF Proph:  lovenox  Code Status: full code Family Communication: spoke with patient today and her mother vicky Takaki via phone Disposition Plan: Discharge to SNF on Tuesday to complete course of antibiotics.     Consultants:  none  Procedures:  CTa chest 1/31  CT a/p 1/31 (unable to detect kidney stones due to residual contrast from CTa chest)  VQ 1/31  US neck 1/31  Antibiotics:  Ceftriaxone 1/31 >> 1/31  Zosyn 1/31 >> 2/3  Cipro 2/3 >>  HPI/Subjective:  States she still feels mildly [place] of breath and has her chronic chest pain.  Eating well.  States she has been having diarrhea, but staff denies and she is bedbound and is always cleaned up by staff.  Denies fevers, chills.    Objective: Filed Vitals:   04/05/12 2120 04/06/12 0534 04/06/12 0903 04/06/12 1404  BP: 137/65 133/70  139/52  Pulse: 108 96  88  Temp: 97.8 F (36.6 C) 98.6 F (37 C)  97.6 F (36.4 C)  TempSrc: Oral Oral  Oral  Resp: 18 20  20   Height:      Weight:      SpO2: 96% 92% 91% 92%    Intake/Output Summary (Last 24 hours) at 04/06/12 1628 Last data filed at 04/06/12 0600  Gross per 24 hour  Intake   1210  ml  Output    300 ml  Net    910 ml   Filed Weights   04/03/12 0857  Weight: 98 kg (216 lb 0.8 oz)    Exam:   General:  Obese CF, no acute distress, sitting up being fed lunch  HEENT:  Dysconjugate gaze  Cardiovascular:  RRR, no mrg, 2+ pulses  Respiratory:  Diminished bilateral breath sounds at bases, no wheezes, rales, or rhonchi  Abdomen:  NABS, soft, nondistended, nontender  MSK:  Decreased muscle tone and bulk, particularly lower extremities, 2+ pitting edema, externally rotated legs.    Neuro:  Minimal lower extremity strength  Data Reviewed: Basic Metabolic Panel:  Lab 04/04/12 1610 04/03/12 0937 04/03/12 0130  NA 138 135 134*  K 4.2 3.9  3.8  CL 103 100 98  CO2 28 26 25   GLUCOSE 114* 88 105*  BUN 9 10 12   CREATININE 0.58 0.53 0.51  CALCIUM 8.7 8.6 9.2  MG -- -- --  PHOS -- -- --   Liver Function Tests:  Lab 04/03/12 0937 04/03/12 0130  AST 18 18  ALT 24 27  ALKPHOS 55 54  BILITOT 0.3 0.2*  PROT 6.0 6.3  ALBUMIN 3.4* 3.6    Lab 04/03/12 0937  LIPASE 36  AMYLASE --   No results found for this basename: AMMONIA:5 in the last 168 hours CBC:  Lab 04/04/12 0510 04/03/12 0937 04/03/12 0130  WBC 7.3 6.7 8.2  NEUTROABS -- -- 4.7  HGB 12.7 12.8 13.4  HCT 39.6 38.6 40.4  MCV 92.7 91.9 91.2  PLT 160 148* 148*   Cardiac Enzymes:  Lab 04/03/12 1523 04/03/12 0937  CKTOTAL -- --  CKMB -- --  CKMBINDEX -- --  TROPONINI <0.30 <0.30   BNP (last 3 results)  Basename 04/03/12 0937  PROBNP <5.0   CBG: No results found for this basename: GLUCAP:5 in the last 168 hours  Recent Results (from the past 240 hour(s))  URINE CULTURE     Status: Normal   Collection Time   04/03/12  1:31 AM      Component Value Range Status Comment   Specimen Description URINE, CATHETERIZED   Final    Special Requests NONE   Final    Culture  Setup Time 04/03/2012 09:17   Final    Colony Count >=100,000 COLONIES/ML   Final    Culture     Final    Value: ESCHERICHIA COLI     Note: Confirmed Extended Spectrum Beta-Lactamase Producer (ESBL)     PROTEUS MIRABILIS     Note: CRITICAL RESULT CALLED TO, READ BACK BY AND VERIFIED WITH: DR.Dashiell Franchino @225PM  ON [id number] BY [place]ONO   Report Status 04/06/2012 FINAL   Final    Organism ID, Bacteria ESCHERICHIA COLI   Final    Organism ID, Bacteria PROTEUS MIRABILIS   Final   MRSA PCR SCREENING     Status: Normal   Collection Time   04/03/12  9:28 AM      Component Value Range Status Comment   MRSA by PCR NEGATIVE  NEGATIVE Final   STOOL CULTURE     Status: Normal (Preliminary result)   Collection Time   04/04/12  1:16 AM      Component Value Range Status Comment   Specimen Description STOOL    Final    Special Requests NONE   Final    Culture NO SUSPICIOUS COLONIES, CONTINUING TO HOLD   Final    Report Status PENDING  Incomplete   CLOSTRIDIUM DIFFICILE BY PCR     Status: Normal   Collection Time   04/04/12  1:16 AM      Component Value Range Status Comment   C difficile by pcr NEGATIVE  NEGATIVE Final      Studies: US Thyroid Biopsy  04/06/2012  *RADIOLOGY REPORT*  Clinical Data:  Dominant left thyroid nodule.  Family history of goiter.  Request has been made for fine needle aspirate.  ULTRASOUND GUIDED NEEDLE ASPIRATE BIOPSY OF THE THYROID GLAND  Comparison: Ultrasound of the thyroid.  Thyroid biopsy was thoroughly discussed with the patient and questions were answered.  The benefits, risks, alternatives, and complications were also discussed.  The patient understands and wishes to proceed with the procedure.  Written consent was obtained.  Ultrasound was performed to localize and mark an adequate site for the biopsy.  High vascularity noted  surrounding the nodule is noted and was discussed with the patient.  The patient was then prepped and draped in a normal sterile fashion.  Local anesthesia was provided with 1% lidocaine.  Using direct ultrasound guidance, three passes were made using  INRAD  needles into the nodule within the left lobe of the thyroid.  Ultrasound was used to confirm needle placements on all occasions.  Specimens were sent to Pathology for analysis.  Complications:  None immediate. Small area of bleeding at needle insertion site at the thyroid  Findings:  Vascular dominant left thryroid nodule with smmall trace of bleeding around the thyroid needle insertion site note on post imaging.  IMPRESSION: Ultrasound guided needle aspirate biopsy performed of the left thyroid nodule.  Read by: Anselm Pancoast, P.A.-C   Original Report Authenticated By: Irish Lack, M.D.     Scheduled Meds:    . albuterol  2 puff Inhalation TID  . aspirin EC  81 mg Oral Daily  .  azelastine  1 spray Each Nare BID  . baclofen  10 mg Oral QID  . budesonide-formoterol  1 puff Inhalation BID  . citalopram  40 mg Oral QHS  . clonazePAM  1 mg Oral BID  . cromolyn  1 drop Both Eyes QID  . enoxaparin (LOVENOX) injection  50 mg Subcutaneous Q24H  . fosfomycin  3 g Oral Q48H  . lamoTRIgine  100 mg Oral BID  . levETIRAcetam  250 mg Oral Q12H  . levothyroxine  25 mcg Oral QAC breakfast  . loratadine  10 mg Oral Daily  . montelukast  10 mg Oral q morning - 10a  . nystatin   Topical BID  . potassium citrate  10 mEq Oral TID WC  . risperiDONE  2 mg Oral BID  . risperiDONE microspheres  25 mg Intramuscular Q14 Days  . simvastatin  20 mg Oral q1800  . sodium chloride  1 spray Each Nare BID  . sodium chloride  3 mL Intravenous Q12H  . venlafaxine XR  150 mg Oral Q breakfast   Continuous Infusions:    Principal Problem:  *Shortness of breath Active Problems:  Hypertension  Cerebral palsy  Morbid obesity  Nausea vomiting and diarrhea  UTI (lower urinary tract infection)  Nephrolithiasis    Time spent: 30 min    Luis Nickles, [facility]  [facility] Pager [phone]. If 7PM-7AM, please contact night-coverage at [email or web address], password [facility] 04/06/2012, 4:28 PM  LOS: 3 days

## 2012-04-06 NOTE — Procedures (Signed)
Procedure : left thyroid nodule  Specimen : 18g INRAD specimen x 3 Complications : none immediate  Blood loss : minimal. Pt tolerated well.

## 2012-04-06 NOTE — Progress Notes (Signed)
ANTIBIOTIC CONSULT NOTE - FOLLOW UP  Pharmacy Consult for Zosyn Indication: UTI (PMHx pertinent for hx staghorn calculi, MDR E.Coli)  Allergies  Allergen Reactions  . Sulfa Antibiotics Hives  . Tape Rash    Paper tape    Patient Measurements: Height: 5\' 3"  (160 cm) Weight: 216 lb 0.8 oz (98 kg) IBW/kg (Calculated) : 52.4  Adjusted Body Weight:   Vital Signs: Temp: 98.6 F (37 C) (02/03 0534) Temp src: Oral (02/03 0534) BP: 133/70 mmHg (02/03 0534) Pulse Rate: 96  (02/03 0534) Intake/Output from previous day: 02/02 0701 - 02/03 0700 In: 1830 [P.O.:1680; IV Piggyback:150] Out: 700 [Urine:700] Intake/Output from this shift:    Labs:  [facility] 04/04/12 0510  WBC 7.3  HGB 12.7  PLT 160  LABCREA --  CREATININE 0.58   Estimated Creatinine Clearance: 99 ml/min (by C-G formula based on Cr of 0.58). No results found for this basename: VANCOTROUGH:2,VANCOPEAK:2,VANCORANDOM:2,GENTTROUGH:2,GENTPEAK:2,GENTRANDOM:2,TOBRATROUGH:2,TOBRAPEAK:2,TOBRARND:2,AMIKACINPEAK:2,AMIKACINTROU:2,AMIKACIN:2, in the last 72 hours   Microbiology: Recent Results (from the past 720 hour(s))  URINE CULTURE     Status: Normal (Preliminary result)   Collection Time   04/03/12  1:31 AM      Component Value Range Status Comment   Specimen Description URINE, CATHETERIZED   Final    Special Requests NONE   Final    Culture  Setup Time 04/03/2012 09:17   Final    Colony Count >=100,000 COLONIES/ML   Final    Culture GRAM NEGATIVE RODS   Final    Report Status PENDING   Incomplete   MRSA PCR SCREENING     Status: Normal   Collection Time   04/03/12  9:28 AM      Component Value Range Status Comment   MRSA by PCR NEGATIVE  NEGATIVE Final   STOOL CULTURE     Status: Normal (Preliminary result)   Collection Time   04/04/12  1:16 AM      Component Value Range Status Comment   Specimen Description STOOL   Final    Special Requests NONE   Final    Culture Culture reincubated for better growth   Final     Report Status PENDING   Incomplete   CLOSTRIDIUM DIFFICILE BY PCR     Status: Normal   Collection Time   04/04/12  1:16 AM      Component Value Range Status Comment   C difficile by pcr NEGATIVE  NEGATIVE Final     Anti-infectives     Start     Dose/Rate Route Frequency Ordered Stop   04/03/12 2200   cefTRIAXone (ROCEPHIN) 1 g in dextrose 5 % 50 mL IVPB  Status:  Discontinued        1 g 100 mL/hr over 30 Minutes Intravenous Every 24 hours 04/03/12 0906 04/03/12 1650   04/03/12 1800  piperacillin-tazobactam (ZOSYN) IVPB 3.375 g       3.375 g 12.5 mL/hr over 240 Minutes Intravenous Every 8 hours 04/03/12 1705     04/03/12 0245   cefTRIAXone (ROCEPHIN) 1 g in dextrose 5 % 50 mL IVPB        1 g 100 mL/hr over 30 Minutes Intravenous  Once 04/03/12 0232 04/03/12 0326          Assessment: 46 yoF on D#4 Zosyn for UTI.  Hx staghorn calculi, MDR E.Coli ~ 1 month ago.  Also hx renal stones requiring stent, nephrostomy tube placement.    Afebrile  WBC WNL  Renal fxn stable, CrCl ~100 ml/min  Cultures:  Urine Cx (1/31): >100K GNRs, C/S pending, stool culture pending, CDiff PCR negative  Plan:  1.  Continue Zosyn 3.375g IV q8h (infuse over 4 hours) 2.  F/u renal fxn, cultures, clinical course  Haynes Hoehn, PharmD 04/06/2012 1:34 PM  Pager: [phone]

## 2012-04-06 NOTE — Progress Notes (Signed)
CSW received consult that patient would like to go to [facility]. CSW spoke with Clydie Braun @ [facility] who informed CSW that patient had exhausted her rehab days at their facility and they had no long term beds available at this time. Patient transferred from [facility] to [facility] because [facility] had long term beds available. CSW spoke with patient's mother, Larene Beach and encouraged her to call [facility] to put her on a waiting list for a long term bed. Patient aware.   Patient will go back to [facility] at discharge. Patient is aware & agreeable.   Unice Bailey, LCSW [facility] Clinical Social Worker cell #: [phone]

## 2012-04-06 NOTE — Progress Notes (Addendum)
ANTIBIOTIC CONSULT NOTE - INITIAL  Pharmacy Consult for Ciprofloxacin Indication: UTI  Allergies  Allergen Reactions  . Sulfa Antibiotics Hives  . Tape Rash    Paper tape    Patient Measurements: Height: 5\' 3"  (160 cm) Weight: 216 lb 0.8 oz (98 kg) IBW/kg (Calculated) : 52.4   Vital Signs: Temp: 97.6 F (36.4 C) (02/03 1404) Temp src: Oral (02/03 1404) BP: 139/52 mmHg (02/03 1404) Pulse Rate: 88  (02/03 1404) Intake/Output from previous day: 02/02 0701 - 02/03 0700 In: 1830 [P.O.:1680; IV Piggyback:150] Out: 700 [Urine:700] Intake/Output from this shift:    Labs:  [facility] 04/04/12 0510  WBC 7.3  HGB 12.7  PLT 160  LABCREA --  CREATININE 0.58   Estimated Creatinine Clearance: 99 ml/min (by C-G formula based on Cr of 0.58). No results found for this basename: VANCOTROUGH:2,VANCOPEAK:2,VANCORANDOM:2,GENTTROUGH:2,GENTPEAK:2,GENTRANDOM:2,TOBRATROUGH:2,TOBRAPEAK:2,TOBRARND:2,AMIKACINPEAK:2,AMIKACINTROU:2,AMIKACIN:2, in the last 72 hours    Assessment:  46 yof on Day#4 of Zosyn for E.coli and Proteus mirabilis UTI.  MD ordered to switch antibiotic to Cipro to complete 7 days of therapy.  E.coli is resistant to Cipro so MD ordered for fosfomycin 3gm q48 hours for 3 doses.  No data available for sensitivity of Proteus mirabilis to fofomycin.  Good renal function, afebrile, WBC wnl  Plan:   Ciprofloxacin 500 mg po bid x 3 more days to complete 7 days of therapy   Pharmacy will sign off   Geoffry Paradise, PharmD, BCPS Pager: [phone] 6:35 PM Pharmacy #: [phone]

## 2012-04-07 DIAGNOSIS — E041 Nontoxic single thyroid nodule: Secondary | ICD-10-CM

## 2012-04-07 DIAGNOSIS — N390 Urinary tract infection, site not specified: Secondary | ICD-10-CM

## 2012-04-07 DIAGNOSIS — J9811 Atelectasis: Secondary | ICD-10-CM

## 2012-04-07 DIAGNOSIS — A498 Other bacterial infections of unspecified site: Secondary | ICD-10-CM

## 2012-04-07 MED ORDER — POTASSIUM CITRATE ER 10 MEQ (1080 MG) PO TBCR: 10.0000 meq | EXTENDED_RELEASE_TABLET | Freq: Three times a day (TID) | ORAL | Status: DC

## 2012-04-07 MED ORDER — CIPROFLOXACIN HCL 500 MG PO TABS: 500.0000 mg | ORAL_TABLET | Freq: Two times a day (BID) | ORAL | Status: DC

## 2012-04-07 MED ORDER — NYSTATIN 100000 UNIT/GM EX POWD: CUTANEOUS | Status: DC

## 2012-04-07 MED ORDER — ALBUTEROL SULFATE HFA 108 (90 BASE) MCG/ACT IN AERS: 2.0000 | INHALATION_SPRAY | RESPIRATORY_TRACT | Status: DC | PRN

## 2012-04-07 MED ORDER — FOSFOMYCIN TROMETHAMINE 3 G PO PACK: 3.0000 g | PACK | ORAL | Status: DC

## 2012-04-07 MED ORDER — HYDROCODONE-ACETAMINOPHEN 10-325 MG PO TABS: 1.0000 | ORAL_TABLET | Freq: Every evening | ORAL | Status: DC | PRN

## 2012-04-07 NOTE — ED Provider Notes (Signed)
Medical screening examination/treatment/procedure(s) were performed by non-physician practitioner and as supervising physician I was immediately available for consultation/collaboration.  Olivia Mackie, MD 04/07/12 [phone]

## 2012-04-07 NOTE — Discharge Summary (Addendum)
Physician Discharge Summary  Kristi Perry ZOX:[id number] DOB: 12/27/1965 DOA: 04/03/2012  PCP: Kristi Einstein, MD  Admit date: 04/03/2012 Discharge date: 04/07/2012  Recommendations for Outpatient Follow-up:  1. PCP within 1 weeks of discharge to follow up the results of the thyroid nodule biopsy, consideration of sleep study if not already complete.  Please check BMP to check potassium after starting potassium citrate.   2. Urology within 1-2 months (Kristi Perry) 3. Antibiotics as below.   4. Restart miralax 17gm daily when stools formed and < 2 per day.   5. Continue incentive spirometry during the day to prevent atelectasis.  10 times every 2 hours while awake  Discharge Diagnoses:  Principal Problem:  *Infection due to ESBL-producing Escherichia coli Active Problems:  Hypertension  Cerebral palsy  Morbid obesity  Shortness of breath  UTI (lower urinary tract infection)  Nephrolithiasis  Atelectasis  Urinary tract infection due to Proteus  Thyroid nodule   Discharge Condition: stable, improved  Diet recommendation:    Healthy heart, dysphagia 3 with thin liquids.    **Please try to aim for the following number of calories to prevent further weight gain:** Total calories per day:  1900-2050 Protein:  100-115gm Fluid:  2.7L   Wt Readings from Last 3 Encounters:  04/03/12 98 kg (216 lb 0.8 oz)  02/27/12 97.2 kg (214 lb 4.6 oz)  12/05/11 95.709 kg (211 lb)    History of present illness:   47 year-old female with history of cerebral palsy, from plasma, depression was brought from the nursing home after patient was complaining of cough. On arrival patient also was stating that she was having some shortness of breath and was found to be mildly hypoxic. In addition patient has been having some nausea vomiting and diarrhea for last 3-4 days. Denies any fever chills. Occasionally has some chest pressure presently chest pain-free. CT angiogram of the chest was done which does  not show definite pulmonary embolism and radiologist has recommended VQ scan. UA shows features consistent with UTI and has been started on antibiotics.  Hospital Course:   UTI, ESBL E. Coli AND Proteus:  Kristi Perry was started on ceftriaxone empirically for UTI and was broadened to zosyn after reviewing her previous urine cultures and sensitivities from the last several months.  Her urine culture was speciated and sensitivities posted on 2/3 and her antibiotics were changed to ciprofloxacin and fosfomycin.  She should continue ciprofloxacin through 2/5.  Her fosfomycin is dosed 3gm q48h, first dose on 2/3, next dose on 2/5, last dose on 2/7, then stop.  I spoke with Dr. Ninetta Perry from Infectious Disease regarding the utility of prophylactic antibiotics because this possibility had been brought up by urology, however, he stated it would likely not be helpful.  Given her resistance patterns to PCNs, cephalosporins, bactrim, fluoroquinolones and nitrofurantoin from the last several months, it would be difficult to choose an antibiotic that would truly suppress infection anyway.    Nephrolithiasis:  CT scan demonstrated small kidney stones, max 4mm, inferior pole of right kidney.  She was started on potassium citrate TID to dissolve the stones.  She should have a repeat BMP in 1 week to check her potassium levels.  She should follow up with Kristi Perry within 1-2 months.    Shortness of breath with acute hypoxic respiratory failure, now resolved:  She was placed on 2L nasal canula.  Troponins were negative.  CXR demonstrated possible pneumonia, but CTa was consistent with atelectasis.  She was afebrile.  CTa was nondiagnostic for PE due to contrast timing, but VQ scan was negative, also suggesting that she had atelectasis which resolved.  She was started on incentive spirometry and albuterol treatments to encourage deep breathing and prevent asthma exacerbation.  She was quickly weaned to room air.   Consider outpatient sleep study as she is at risk for OHS and OSA.    Nausea vomiting and diarrhea may have been due to diarrhea.  Her appetite quickly returned.   - C. difficile neg  - stool culture neg  #4. Bronchial asthma - presently not wheezing.  - Continue albuterol as needed - Continue symbicort and singulair   #5. Hypothyroidism - TSH wnl  - Continued synthroid   Thyroid mass of left lobe was noted on CT angio chest and was verified on thyroid ultrasound - s/p US guided biopsy of mass on 2/3 - TSH wnl, fT4 1.13  -  PCP to please follow up on results of thyroid biopsy.    #6. History of depression and entitled personality disorder- continue present medications.  - Continued clonazepam  - Continued effexor XR  - Continued citalopram  - Continued risperidone   #7. History of epilepsy, no current seizures  - Continued lamotrigine   Obesity with BMI ~38.  Significant weight gain over the last year making it difficult to perform cares  - Nutrition recommended:  Healthy heart, dysphagia 3 with thin liquids.   Total calories per day:  1900-2050 Protein:  100-115gm Fluid:  2.7L  Consultants:  none Procedures:  CTa chest 1/31  CT a/p 1/31 (unable to detect kidney stones due to residual contrast from CTa chest)  VQ 1/31  US neck 1/31 Thyroid nodule biopsy on 2/3 Antibiotics:  Ceftriaxone 1/31 >> 1/31  Zosyn 1/31 >> 2/3  Cipro 2/3 >> 2/5 Fosfomycin 2/3, 2/5, 2/7  Discharge Exam: Filed Vitals:   04/07/12 0527  BP: 135/68  Pulse: 92  Temp: 97.7 F (36.5 C)  Resp: 19   Filed Vitals:   04/06/12 2111 04/06/12 2142 04/07/12 0527 04/07/12 1021  BP: 136/72  135/68   Pulse: 92  92   Temp: 97.5 F (36.4 C)  97.7 F (36.5 C)   TempSrc: Oral  Oral   Resp: 19 20 19    Height:      Weight:      SpO2: 97%  97% 93%    General: Obese CF, no acute distress, lying in bed smiling.   HEENT: Dysconjugate gaze  Cardiovascular: RRR, no mrg, 2+ pulses  Respiratory:  Diminished bilateral breath sounds at bases, no wheezes, rales, or rhonchi  Abdomen: NABS, soft, nondistended, nontender  MSK: Decreased muscle tone and bulk, particularly lower extremities, 1+ pitting edema, externally rotated legs.  Neuro: Minimal lower extremity strength  Discharge Instructions      Discharge Orders    Future Orders Please Complete By Expires   Diet - low sodium heart healthy      Comments:   Total calories per day:  1900-2050 Protein:  100-115gm Fluid:  2.7L   Increase activity slowly      Discharge instructions      Comments:   You were hospitalized with shortness of breath and were found to have atelectasis.  Please continue to use your incentive spirometer 10 times every 2 hours while awake.  You do not qualify for oxygen.  You were found to have a thyroid nodule which was biopsied.  Please follow up with your primary care doctor within 1 week  to discuss the results of the biopsy.  You had a urinary tract infection which grew Proteus and E. Coli, both of which were resistant to many antibiotics.  You were given antibiotics during your hospitalization and should continue ciprofloxacin through 2/5.  You should continue fosfomycin dosed every other day, next dose on 2/5 and last dose on 2/7.  You had some small kidney stones on the right side and you were started on potassium citrate to try to dissolve these stones.  You will not be placed on prophylactic antibiotics at the advice of Infectious Disease and also because your resistance patterns make it difficult to find an oral antibiotic that would be effective in preventing infection anyway.   Call MD for:  temperature >100.4      Call MD for:  persistant nausea and vomiting      Call MD for:  severe uncontrolled pain      Call MD for:  difficulty breathing, headache or visual disturbances      Call MD for:  hives      Call MD for:  persistant dizziness or light-headedness      Call MD for:  extreme fatigue      Call  MD for:  redness, tenderness, or signs of infection (pain, swelling, redness, odor or green/yellow discharge around incision site)          Medication List     As of 04/07/2012 12:03 PM    STOP taking these medications         polyethylene glycol packet   Commonly known as: MIRALAX / GLYCOLAX      TAKE these medications         acetaminophen 500 MG tablet   Commonly known as: TYLENOL   Take 1,000 mg by mouth 2 (two) times daily.      albuterol 108 (90 BASE) MCG/ACT inhaler   Commonly known as: PROVENTIL HFA;VENTOLIN HFA   Inhale 2 puffs into the lungs every 4 (four) hours as needed for wheezing or shortness of breath.      aspirin EC 81 MG tablet   Take 81 mg by mouth daily.      azelastine 137 MCG/SPRAY nasal spray   Commonly known as: ASTELIN   Place 1 spray into the nose 2 (two) times daily. For allergies      baclofen 10 MG tablet   Commonly known as: LIORESAL   Take 10 mg by mouth 4 (four) times daily.      budesonide-formoterol 160-4.5 MCG/ACT inhaler   Commonly known as: SYMBICORT   Inhale 1 puff into the lungs 2 (two) times daily.      ciprofloxacin 500 MG tablet   Commonly known as: CIPRO   Take 1 tablet (500 mg total) by mouth 2 (two) times daily.      citalopram 40 MG tablet   Commonly known as: CELEXA   Take 40 mg by mouth at bedtime.      clonazePAM 1 MG tablet   Commonly known as: KLONOPIN   Take 1 tablet (1 mg total) by mouth 2 (two) times daily.      fexofenadine-pseudoephedrine 180-240 MG per 24 hr tablet   Commonly known as: ALLEGRA-D 24   Take 1 tablet by mouth every morning.      fosfomycin 3 G Pack   Commonly known as: MONUROL   Take 3 g by mouth every other day.      HYDROcodone-acetaminophen 10-325 MG per tablet   Commonly  known as: NORCO   Take 1 tablet by mouth at bedtime as needed. For pain      ketotifen 0.025 % ophthalmic solution   Commonly known as: ZADITOR   Place 1 drop into both eyes 2 (two) times daily.       lamoTRIgine 100 MG tablet   Commonly known as: LAMICTAL   Take 100 mg by mouth 2 (two) times daily.      levalbuterol 0.63 MG/3ML nebulizer solution   Commonly known as: XOPENEX   Take 3 mLs (0.63 mg total) by nebulization every 8 (eight) hours as needed for wheezing.      levETIRAcetam 250 MG tablet   Commonly known as: KEPPRA   Take 250 mg by mouth every 12 (twelve) hours.      levothyroxine 25 MCG tablet   Commonly known as: SYNTHROID, LEVOTHROID   Take 25 mcg by mouth daily before breakfast.      LORazepam 1 MG tablet   Commonly known as: ATIVAN   Take 1 tablet (1 mg total) by mouth every 4 (four) hours as needed. For agitation      montelukast 10 MG tablet   Commonly known as: SINGULAIR   Take 10 mg by mouth every morning.      nystatin 100000 UNIT/GM Powd   Apply topically 2 times per day to intertrigenous rashed areas      potassium citrate 10 MEQ (1080 MG) SR tablet   Commonly known as: UROCIT-K   Take 1 tablet (10 mEq total) by mouth 3 (three) times daily with meals.      pravastatin 40 MG tablet   Commonly known as: PRAVACHOL   Take 40 mg by mouth daily.      risperiDONE 2 MG tablet   Commonly known as: RISPERDAL   Take 2 mg by mouth 2 (two) times daily.      risperiDONE microspheres 25 MG injection   Commonly known as: RISPERDAL CONSTA   Inject 25 mg into the muscle every 14 (fourteen) days.      senna 8.6 MG Tabs   Commonly known as: SENOKOT   Take 2 tablets (17.2 mg total) by mouth daily as needed.      sodium chloride 0.65 % nasal spray   Commonly known as: OCEAN   Place 1 spray into the nose 2 (two) times daily.      SYSTANE BALANCE 0.6 % Soln   Generic drug: Propylene Glycol   Apply 1 drop to eye 4 (four) times daily as needed. For dry eyes      venlafaxine XR 150 MG 24 hr capsule   Commonly known as: EFFEXOR-XR   Take 150 mg by mouth daily with breakfast.      vitamin C 500 MG tablet   Commonly known as: ASCORBIC ACID   Take 500 mg by  mouth 2 (two) times daily.      Vitamin D 400 UNITS capsule   Take 400 Units by mouth every morning.         Follow-up Information    Follow up with [facility], [place]. Schedule an appointment as soon as possible for a visit in 1 week.   Contact information:   [address] [place] [address] [phone]           The results of significant diagnostics from this hospitalization (including imaging, microbiology, ancillary and laboratory) are listed below for reference.    Significant Diagnostic Studies: Ct Abdomen Pelvis Wo Contrast  04/04/2012  *  RADIOLOGY REPORT*  Clinical Data: Evaluate for nephrolithiasis, history of complicated urinary tract infection and prior nephrolithotomy  CT ABDOMEN AND PELVIS WITHOUT CONTRAST  Technique:  Multidetector CT imaging of the abdomen and pelvis was performed following the standard protocol without intravenous contrast.  Comparison: CT abdomen pelvis - 04/03/2012; 10/18/2011; 08/20/2011; right-sided percutaneous nephrolithotomy access - 07/30/2013abdominal radiograph - 04/04/2012  Findings:  The lack of intravenous contrast limits the ability to evaluate solid abdominal organs.  There is no retained excreted contrast within either collecting system, ureter or the urinary bladder.  There has been significant interval reduction of previously noted right-sided staghorn calculi burden.  There are 3 persistent nonobstructing stones within the inferior pole of the right kidney with dominant stone measuring approximately 4 mm in diameter (image 41, series 2).  No left-sided nephrolithiasis.  No stones are seen within the expected location of either ureter or the urinary bladder.  A Foley catheter and small amount of air is seen within a decompressed urinary bladder.  Multiple phleboliths overlie the left hemipelvis.  No urinary obstruction or perinephric stranding.  Normal hepatic contour.  Normal noncontrast appearance of the gallbladder.  No ascites.   Normal noncontrast appearance of bilateral adrenal glands, pancreas and spleen.  Colonic diverticulosis without evidence of diverticulitis on this noncontrast examination.  The bowel is otherwise normal in course and caliber without wall thickening or evidence of obstruction. Normal noncontrast appearance of the appendix.  No pneumoperitoneum, pneumatosis or portal venous gas.  Normal caliber of the abdominal aorta.  Scattered retroperitoneal lymph nodes are not enlarged by CT criteria.  No retroperitoneal, mesenteric, pelvic or inguinal lymphadenopathy.  There is a small amount of presumably physiologic fluid within the endometrial canal. Decreased conspicuity of left-sided adnexal lesion.  No discrete adnexal lesion.  No free fluid in the pelvis. Note is made of prominent varicosities about the lower pelvis and labia.  Limited visualization of the lower thorax is degraded secondary to patient respiratory artifact.  Grossly unchanged minimal bibasilar ground-glass atelectasis.  No focal airspace opacities.  No definite pleural effusion.  Normal heart size.  A trace amount of pericardial fluid, presumably physiologic.  No acute or aggressive osseous abnormalities. Unchanged mild (<25%) compression deformity of the superior endplate of L4.  Post ORIF of the right greater trochanter.  IMPRESSION: 1.  Interval complete clearance of previously noted excreted contrast. 2.  Significant reduction in previously noted right-sided staghorn calculi with 3 tiny residual nonobstructing right-sided renal stones remaining, dominant stone measuring 4 mm in diameter.  No left-sided nephrolithiasis.  No urinary obstruction. 3.  Urinary bladder decompressed with a Foley catheter. 4.  Colonic diverticulosis without evidence of diverticulitis.   Original Report Authenticated By: Tacey Ruiz, MD    Ct Abdomen Pelvis Wo Contrast  04/03/2012  *RADIOLOGY REPORT*  Clinical Data: Hydronephrosis.  History of nephrolithotomy.  Chest pain.   CT ABDOMEN AND PELVIS WITHOUT CONTRAST  Technique:  Multidetector CT imaging of the abdomen and pelvis was performed following the standard protocol without intravenous contrast.  Comparison: 04/03/2012  Findings: Linear subsegmental atelectasis noted in both lower lobes. Technical factors related to patient body habitus reduce diagnostic sensitivity and specificity.  The visualized portion of the liver, spleen, pancreas, and adrenal glands appear unremarkable in noncontrast CT appearance.  Contrast medium administered with the patient's recent chest CT is present throughout the renal collecting systems and ureters.  The patient has known history of right-sided calculi, but any nonobstructive calculi in the right collecting system are  obscured by the surrounding contrast which is of similar density.  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  No hydronephrosis observed.  Small retroperitoneal lymph nodes are not pathologically enlarged by size criteria.  Scattered diverticula of the descending and sigmoid colon noted.  A hypodense 2.8 x 2.4 cm left adnexal cystic lesion is present. No pathologic pelvic adenopathy is identified.  Exaggerated lumbar lordosis noted.  Chronic superior endplate can cavity at the L4 vertebra is present.  There is a segmental S1 vertebra.  Grade 1 posterior subluxation at L5-S1 noted  Left iliopsoas atrophy is stable from prior exam. Despite efforts by the patient and technologist, motion artifact is present on some series of today's examination and could not be totally eliminated. This reduces diagnostic sensitivity and specificity.  IMPRESSION:  1.  No hydronephrosis or hydroureter.  I cannot exclude nonobstructive calculi due to the presence of contrast in the collecting systems.  There is a small calyceal diverticulum of the right kidney upper pole subtending right upper pole scarring. 2.  Linear subsegmental atelectasis in  both lower lobes. 3.  Scattered sigmoid and descending colon diverticula. 4.  Cystic left adnexal/ovarian lesion measures up to 2.8 cm in size.  This is new compared to 11/28/2011.  Probably simply represents an ovarian cyst.  Given its size and to the patient's expected premenopausal status, no further workup is required.  This recommendation follows ACR consensus guidelines:  White Paper of the ACR Incidental Findings Committee II on Adnexal Findings.  J Am Coll Radiol 2013:10:675-681. 5.  Exaggerated lumbar lordosis.   Original Report Authenticated By: Gaylyn Rong, M.D.    Dg Chest 2 View  04/03/2012  *RADIOLOGY REPORT*  Clinical Data: Cough, congestion and shortness of breath  CHEST - 2 VIEW  Comparison: 02/27/2012 and prior chest radiographs a  Findings: The cardiomediastinal silhouette is unremarkable. This is a mildly low volume film. Left basilar opacity identified primarily on the lateral view is noted - suspect atelectasis but airspace disease/pneumonia is not excluded. There is no evidence of pleural effusion or pneumothorax. No acute bony abnormalities identified.  IMPRESSION: Left lower lobe opacity - suspect atelectasis but pneumonia is not excluded.   Original Report Authenticated By: Harmon Pier, M.D.    Dg Abd 1 View  04/04/2012  *RADIOLOGY REPORT*  Clinical Data: Evaluate for residual contrast  ABDOMEN - 1 VIEW  Comparison: CT abdomen 04/03/2012  Findings: Single abdominal KUB.  No residual excreted contrast material in the renal collecting systems or bladder.  Nonobstructed bowel gas pattern.  Multiple vascular phleboliths project over the anatomic pelvis.  No acute osseous abnormality.  IMPRESSION: No residual excreted contrast material in the renal collecting systems or bladder.   Original Report Authenticated By: Malachy Moan, M.D.    Ct Angio Chest Pe W/cm &/or Wo Cm  04/03/2012  *RADIOLOGY REPORT*  Clinical Data: Hypoxia, cough  CT ANGIOGRAPHY CHEST  Technique:  Multidetector  CT imaging of the chest using the standard protocol during bolus administration of intravenous contrast. Multiplanar reconstructed images including MIPs were obtained and reviewed to evaluate the vascular anatomy.  Contrast: OMNIPAQUE IOHEXOL 350 MG/ML SOLN  Comparison: 04/03/2012, 04/24/2004  Findings: Enlarged left lobe of the thyroid gland, displaces the trachea rightward.  Nondiagnostic evaluation for pulmonary embolism secondary to contrast bolus timing, respiratory motion, and streak artifact from extremity positioning.  Normal caliber aorta.  Normal heart size.  No pleural or pericardial effusion.  Small  hiatal hernia.  No intrathoracic lymphadenopathy.  Central airways are patent.  Bibasilar atelectasis.  No pneumothorax.  Detailed parenchymal evaluation is degraded by respiratory motion.  Limited images through the upper abdomen show no acute finding.  No acute osseous finding.  IMPRESSION: Nondiagnostic for pulmonary embolism. Recommend repeat examination or VQ scan if clinical concern persists.  Bibasilar atelectasis.  Enlarged left lobe of the thyroid gland.  This has considerably enlarged since 2006.  Recommend thyroid ultrasound.   Original Report Authenticated By: Jearld Lesch, M.D.    US Soft Tissue Head/neck  04/03/2012  *RADIOLOGY REPORT*  Clinical Data: Enlarged thyroid.  THYROID ULTRASOUND  Technique: Ultrasound examination of the thyroid gland and adjacent soft tissues was performed.  Comparison:  None.  Findings:  Right thyroid lobe:  3.4 x 1.0 x 1.2 cm. Left thyroid lobe:  5.6 x 3.8 x 4.7 cm. Isthmus:  4 mm.  Focal nodules:  There is suspicion for a large solid mass replacing much of the left thyroid lobe. No focal nodule on the right.  Lymphadenopathy:  None visualized.  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  Recommend tissue sampling.   Original Report Authenticated By: Charlett Nose, M.D.    Nm Pulmonary Perf And  Vent  04/03/2012  *RADIOLOGY REPORT*  Clinical Data: Keyondre Hepburn of breath.  Pulmonary embolus.  NM PULMONARY VENTILATION AND PERFUSION SCAN  Radiopharmaceutical: 6.6MILLI CURIE MAA TECHNETIUM TO 76M ALBUMIN AGGREGATED intravenous injection.  41 mCi technetium 99 DTPA inhalation.  Comparison: Chest radiograph 04/03/2012.  Findings: The ventilation study is normal.  Perfusion study is also normal.  No defects.  IMPRESSION: Normal study.   Original Report Authenticated By: Andreas Newport, M.D.    US Thyroid Biopsy  04/06/2012  *RADIOLOGY REPORT*  Clinical Data:  Dominant left thyroid nodule.  Family history of goiter.  Request has been made for fine needle aspirate.  ULTRASOUND GUIDED NEEDLE ASPIRATE BIOPSY OF THE THYROID GLAND  Comparison: Ultrasound of the thyroid.  Thyroid biopsy was thoroughly discussed with the patient and questions were answered.  The benefits, risks, alternatives, and complications were also discussed.  The patient understands and wishes to proceed with the procedure.  Written consent was obtained.  Ultrasound was performed to localize and mark an adequate site for the biopsy.  High vascularity noted  surrounding the nodule is noted and was discussed with the patient.  The patient was then prepped and draped in a normal sterile fashion.  Local anesthesia was provided with 1% lidocaine.  Using direct ultrasound guidance, three passes were made using  INRAD  needles into the nodule within the left lobe of the thyroid.  Ultrasound was used to confirm needle placements on all occasions.  Specimens were sent to Pathology for analysis.  Complications:  None immediate. Small area of bleeding at needle insertion site at the thyroid  Findings:  Vascular dominant left thryroid nodule with smmall trace of bleeding around the thyroid needle insertion site note on post imaging.  IMPRESSION: Ultrasound guided needle aspirate biopsy performed of the left thyroid nodule.  Read by: Anselm Pancoast, P.A.-C    Original Report Authenticated By: Irish Lack, M.D.     Microbiology: Recent Results (from the past 240 hour(s))  URINE CULTURE     Status: Normal   Collection Time   04/03/12  1:31 AM      Component Value Range Status Comment   Specimen Description URINE, CATHETERIZED   Final  Special Requests NONE   Final    Culture  Setup Time 04/03/2012 09:17   Final    Colony Count >=100,000 COLONIES/ML   Final    Culture     Final    Value: ESCHERICHIA COLI     Note: Confirmed Extended Spectrum Beta-Lactamase Producer (ESBL)     PROTEUS MIRABILIS     Note: CRITICAL RESULT CALLED TO, READ BACK BY AND VERIFIED WITH: KristiDesmin Daleo @225PM  ON [id number] BY BUONO   Report Status 04/06/2012 FINAL   Final    Organism ID, Bacteria ESCHERICHIA COLI   Final    Organism ID, Bacteria PROTEUS MIRABILIS   Final   MRSA PCR SCREENING     Status: Normal   Collection Time   04/03/12  9:28 AM      Component Value Range Status Comment   MRSA by PCR NEGATIVE  NEGATIVE Final   STOOL CULTURE     Status: Normal (Preliminary result)   Collection Time   04/04/12  1:16 AM      Component Value Range Status Comment   Specimen Description STOOL   Final    Special Requests NONE   Final    Culture NO SUSPICIOUS COLONIES, CONTINUING TO HOLD   Final    Report Status PENDING   Incomplete   CLOSTRIDIUM DIFFICILE BY PCR     Status: Normal   Collection Time   04/04/12  1:16 AM      Component Value Range Status Comment   C difficile by pcr NEGATIVE  NEGATIVE Final      ESCHERICHIA COLI       Antibiotic  Sensitivity  Microscan  Status    AMPICILLIN  Resistant  >=32  Final    Method:  MIC    CEFAZOLIN  Resistant  >=64  Final    Method:  MIC    CEFTRIAXONE  Resistant  >=64  Final    Method:  MIC    CIPROFLOXACIN  Resistant  >=4  Final    Method:  MIC    GENTAMICIN  Sensitive  <=1  Final    Method:  MIC    IMIPENEM  Sensitive  <=0.25  Final    Method:  MIC    LEVOFLOXACIN  Resistant  >=8  Final    Method:  MIC     NITROFURANTOIN  Intermediate  64  Final    Method:  MIC    PIP/TAZO  Sensitive  <=4  Final    Comment:  SET UP TIME: [id number]    Method:  MIC    TOBRAMYCIN  Sensitive  <=1  Final    Method:  MIC    TRIMETH/SULFA  Resistant  >=320  Final    Method:  MIC     Comments  ESCHERICHIA COLI (MIC)      ESCHERICHIA COLI       PROTEUS MIRABILIS       Antibiotic  Sensitivity  Microscan  Status    AMPICILLIN  Sensitive  <=2  Final    Method:  MIC    CEFAZOLIN  Sensitive  <=4  Final    Method:  MIC    CEFTRIAXONE  Sensitive  <=1  Final    Method:  MIC    CIPROFLOXACIN  Sensitive  1  Final    Method:  MIC    GENTAMICIN  Sensitive  <=1  Final    Method:  MIC    LEVOFLOXACIN  Sensitive  1  Final  Method:  MIC    NITROFURANTOIN  Resistant  256  Final    Method:  MIC    PIP/TAZO  Sensitive  <=4  Final    Method:  MIC    TOBRAMYCIN  Sensitive  <=1  Final    Method:  MIC    TRIMETH/SULFA  Sensitive  <=20  Final    Method:  MIC     Comments  PROTEUS MIRABILIS (MIC)      PROTEUS MIRABILIS      Labs: Basic Metabolic Panel:  Lab 04/04/12 1610 04/03/12 0937 04/03/12 0130  NA 138 135 134*  K 4.2 3.9 3.8  CL 103 100 98  CO2 28 26 25   GLUCOSE 114* 88 105*  BUN 9 10 12   CREATININE 0.58 0.53 0.51  CALCIUM 8.7 8.6 9.2  MG -- -- --  PHOS -- -- --   Liver Function Tests:  Lab 04/03/12 0937 04/03/12 0130  AST 18 18  ALT 24 27  ALKPHOS 55 54  BILITOT 0.3 0.2*  PROT 6.0 6.3  ALBUMIN 3.4* 3.6    Lab 04/03/12 0937  LIPASE 36  AMYLASE --   No results found for this basename: AMMONIA:5 in the last 168 hours CBC:  Lab 04/04/12 0510 04/03/12 0937 04/03/12 0130  WBC 7.3 6.7 8.2  NEUTROABS -- -- 4.7  HGB 12.7 12.8 13.4  HCT 39.6 38.6 40.4  MCV 92.7 91.9 91.2  PLT 160 148* 148*   Cardiac Enzymes:  Lab 04/03/12 1523 04/03/12 0937  CKTOTAL -- --  CKMB -- --  CKMBINDEX -- --  TROPONINI <0.30 <0.30   BNP: BNP (last 3 results)  Basename 04/03/12 0937  PROBNP <5.0    CBG: No results found for this basename: GLUCAP:5 in the last 168 hours  Time coordinating discharge: 45 minutes  Signed:  Mansel Strother  [facility] 04/07/2012, 12:03 PM

## 2012-04-08 NOTE — Progress Notes (Signed)
Patient is set to discharge back to [facility] today. Patient, mother and facility are aware. [facility] called for transport. Discharge pack is in [facility] with AVS, FL2 and chart copy.   Unice Bailey, LCSW [facility] Clinical Social Worker cell #: [phone]

## 2012-04-18 ENCOUNTER — Emergency Department (HOSPITAL_COMMUNITY)
Admission: EM | Admit: 2012-04-18 | Discharge: 2012-04-18 | Disposition: A | Discharge status: Skilled Nursing Facility | Payer: Medicare Other | Attending: Emergency Medicine | Admitting: Emergency Medicine

## 2012-04-18 ENCOUNTER — Other Ambulatory Visit: Payer: Self-pay

## 2012-04-18 ENCOUNTER — Encounter (HOSPITAL_COMMUNITY): Payer: Self-pay | Admitting: Emergency Medicine

## 2012-04-18 DIAGNOSIS — Z8614 Personal history of Methicillin resistant Staphylococcus aureus infection: Secondary | ICD-10-CM | POA: Insufficient documentation

## 2012-04-18 DIAGNOSIS — F3289 Other specified depressive episodes: Secondary | ICD-10-CM | POA: Insufficient documentation

## 2012-04-18 DIAGNOSIS — G894 Chronic pain syndrome: Secondary | ICD-10-CM | POA: Insufficient documentation

## 2012-04-18 DIAGNOSIS — Z8701 Personal history of pneumonia (recurrent): Secondary | ICD-10-CM | POA: Insufficient documentation

## 2012-04-18 DIAGNOSIS — F29 Unspecified psychosis not due to a substance or known physiological condition: Secondary | ICD-10-CM | POA: Insufficient documentation

## 2012-04-18 DIAGNOSIS — Z8742 Personal history of other diseases of the female genital tract: Secondary | ICD-10-CM | POA: Insufficient documentation

## 2012-04-18 DIAGNOSIS — Z8709 Personal history of other diseases of the respiratory system: Secondary | ICD-10-CM | POA: Insufficient documentation

## 2012-04-18 DIAGNOSIS — F411 Generalized anxiety disorder: Secondary | ICD-10-CM | POA: Insufficient documentation

## 2012-04-18 DIAGNOSIS — Z8679 Personal history of other diseases of the circulatory system: Secondary | ICD-10-CM | POA: Insufficient documentation

## 2012-04-18 DIAGNOSIS — R112 Nausea with vomiting, unspecified: Secondary | ICD-10-CM | POA: Insufficient documentation

## 2012-04-18 DIAGNOSIS — G809 Cerebral palsy, unspecified: Secondary | ICD-10-CM | POA: Insufficient documentation

## 2012-04-18 DIAGNOSIS — F329 Major depressive disorder, single episode, unspecified: Secondary | ICD-10-CM | POA: Insufficient documentation

## 2012-04-18 DIAGNOSIS — E039 Hypothyroidism, unspecified: Secondary | ICD-10-CM | POA: Insufficient documentation

## 2012-04-18 DIAGNOSIS — Z7982 Long term (current) use of aspirin: Secondary | ICD-10-CM | POA: Insufficient documentation

## 2012-04-18 DIAGNOSIS — G40909 Epilepsy, unspecified, not intractable, without status epilepticus: Secondary | ICD-10-CM | POA: Insufficient documentation

## 2012-04-18 DIAGNOSIS — J4489 Other specified chronic obstructive pulmonary disease: Secondary | ICD-10-CM | POA: Insufficient documentation

## 2012-04-18 DIAGNOSIS — J449 Chronic obstructive pulmonary disease, unspecified: Secondary | ICD-10-CM | POA: Insufficient documentation

## 2012-04-18 DIAGNOSIS — Z87448 Personal history of other diseases of urinary system: Secondary | ICD-10-CM | POA: Insufficient documentation

## 2012-04-18 DIAGNOSIS — Z8619 Personal history of other infectious and parasitic diseases: Secondary | ICD-10-CM | POA: Insufficient documentation

## 2012-04-18 DIAGNOSIS — Z8719 Personal history of other diseases of the digestive system: Secondary | ICD-10-CM | POA: Insufficient documentation

## 2012-04-18 DIAGNOSIS — R197 Diarrhea, unspecified: Secondary | ICD-10-CM

## 2012-04-18 DIAGNOSIS — Z87440 Personal history of urinary (tract) infections: Secondary | ICD-10-CM | POA: Insufficient documentation

## 2012-04-18 DIAGNOSIS — Z79899 Other long term (current) drug therapy: Secondary | ICD-10-CM | POA: Insufficient documentation

## 2012-04-18 DIAGNOSIS — I10 Essential (primary) hypertension: Secondary | ICD-10-CM | POA: Insufficient documentation

## 2012-04-18 DIAGNOSIS — E785 Hyperlipidemia, unspecified: Secondary | ICD-10-CM | POA: Insufficient documentation

## 2012-04-18 DIAGNOSIS — Z87442 Personal history of urinary calculi: Secondary | ICD-10-CM | POA: Insufficient documentation

## 2012-04-18 LAB — URINE MICROSCOPIC-ADD ON

## 2012-04-18 LAB — BASIC METABOLIC PANEL
BUN: 18 mg/dL (ref 6–23)
CO2: 27 mEq/L (ref 19–32)
Chloride: 102 mEq/L (ref 96–112)
Creatinine, Ser: 0.44 mg/dL — ABNORMAL LOW (ref 0.50–1.10)
Glucose, Bld: 133 mg/dL — ABNORMAL HIGH (ref 70–99)

## 2012-04-18 LAB — URINALYSIS, ROUTINE W REFLEX MICROSCOPIC
Glucose, UA: NEGATIVE mg/dL
Ketones, ur: NEGATIVE mg/dL
pH: 5 (ref 5.0–8.0)

## 2012-04-18 LAB — CBC
HCT: 40.7 % (ref 36.0–46.0)
Hemoglobin: 13.4 g/dL (ref 12.0–15.0)
MCV: 92.5 fL (ref 78.0–100.0)
Platelets: 180 10*3/uL (ref 150–400)
RBC: 4.4 MIL/uL (ref 3.87–5.11)
WBC: 7.1 10*3/uL (ref 4.0–10.5)

## 2012-04-18 MED ORDER — SODIUM CHLORIDE 0.9 % IV SOLN: 1000.0000 mL | INTRAVENOUS | Status: DC

## 2012-04-18 MED ORDER — ONDANSETRON HCL 4 MG/2ML IJ SOLN
4.0000 mg | Freq: Once | INTRAMUSCULAR | Status: AC
  Administered 2012-04-18: 4 mg via INTRAVENOUS
  Filled 2012-04-18: qty 2

## 2012-04-18 MED ORDER — SODIUM CHLORIDE 0.9 % IV SOLN
1000.0000 mL | Freq: Once | INTRAVENOUS | Status: AC
  Administered 2012-04-18: 1000 mL via INTRAVENOUS

## 2012-04-18 NOTE — ED Notes (Signed)
Bed:WA10<BR> Expected date:<BR> Expected time:<BR> Means of arrival:<BR> Comments:<BR> EMS

## 2012-04-18 NOTE — ED Notes (Signed)
Pt alert, arrives from assisted living, c/o "pain all over", resp even unlabored, skin pwd, show no s/s of distress/discomfort

## 2012-04-18 NOTE — ED Notes (Signed)
Patient c/o generalized body pain. Patient states she thinks she has pna again. Patient requesting her other drs be called about her being here. Patient requesting to speak with physician.

## 2012-04-18 NOTE — ED Provider Notes (Addendum)
History     CSN: [id number]  Arrival date & time 04/18/12  0018   First MD Initiated Contact with Patient 04/18/12 0025      Chief Complaint  Patient presents with  . Muscle Pain     The history is provided by the patient.   patient reports nausea vomiting diarrhea the past 24 hours.  She is an assisted living Center.  She denies fever and chills.  She has no urinary complaints.  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  Her major complaint this time is mild to this.  She denies flank pain.  She does report hematuria and has a long-standing history of nephrolithiasis with her last CT scan several weeks ago demonstrating nonobstructive nephrolithiasis without ureterolithiasis.  Her symptoms are mild to moderate in severity  Past Medical History  Diagnosis Date  . Sepsis(995.91)   . Hypertension   . DVT (deep vein thrombosis) in pregnancy   . Depression   . Chronic pain syndrome   . Allergic rhinitis   . Acute respiratory failure   . Epilepsy   . Migraine   . Chronic airway obstruction   . Cerebral palsy   . Dysphagia   . Hiatal hernia   . Morbid obesity 09-25-11    requires Hoyer lift. pt. doesn't stand or ambulate.  . Incontinent of urine 09-25-11    hx.  . Incontinent of feces 09-25-11    hx.  . Vitamin D deficiency   . Sepsis(995.91)     hx of   . Hyperlipidemia   . Mild intellectual disabilities   . Neurogenic bladder   . Hypothyroidism   . Anxiety   . Unspecified psychosis   . Muscle weakness   . Dysphasia   . Pneumonia     hx of asp pneumonia 1/11-1/19/12  . Sacral decubitus ulcer     hx of  . Nephrolithiasis 04/05/2012    Past Surgical History  Procedure Laterality Date  . Nephrolithotomy  10/07/2011    Procedure: NEPHROLITHOTOMY PERCUTANEOUS;  Surgeon: Marcine Matar, MD;  Location: [facility];  Service: Urology;  Laterality: Right;  kidney   . Cystoscopy with ureteroscopy  11/07/2011   Procedure: CYSTOSCOPY WITH URETEROSCOPY;  Surgeon: Marcine Matar, MD;  Location: [facility];  Service: Urology;  Laterality: Right;  Removal of right double J stent, Insertion right double J stent  . Stone extraction with basket  11/07/2011    Procedure: STONE EXTRACTION WITH BASKET;  Surgeon: Marcine Matar, MD;  Location: [facility];  Service: Urology;  Laterality: Right;  . Nephrolithotomy  12/06/2011    Procedure: NEPHROLITHOTOMY PERCUTANEOUS;  Surgeon: Marcine Matar, MD;  Location: [facility];  Service: Urology;  Laterality: Right;   REPEAT PCNL     No family history on file.  History  Substance Use Topics  . Smoking status: Never Smoker   . Smokeless tobacco: Never Used  . Alcohol Use: No    OB History   Grav Para Term Preterm Abortions TAB SAB Ect Mult Living                  Review of Systems  All other systems reviewed and are negative.    Allergies  Sulfa antibiotics and Tape  Home Medications   Current Outpatient Rx  Name  Route  Sig  Dispense  Refill  . acetaminophen (TYLENOL) 500 MG tablet   Oral   Take 1,000 mg by mouth 2 (two) times  daily.         . albuterol (PROVENTIL HFA;VENTOLIN HFA) 108 (90 BASE) MCG/ACT inhaler   Inhalation   Inhale 2 puffs into the lungs every 4 (four) hours as needed for wheezing or shortness of breath.         Marland Kitchen aspirin EC 81 MG tablet   Oral   Take 81 mg by mouth daily.         Marland Kitchen azelastine (ASTELIN) 137 MCG/SPRAY nasal spray   Nasal   Place 1 spray into the nose 2 (two) times daily. For allergies         . baclofen (LIORESAL) 10 MG tablet   Oral   Take 10 mg by mouth 4 (four) times daily.          . budesonide-formoterol (SYMBICORT) 160-4.5 MCG/ACT inhaler   Inhalation   Inhale 1 puff into the lungs 2 (two) times daily.         . Cholecalciferol (VITAMIN D) 400 UNITS capsule   Oral   Take 400 Units by mouth every morning.          . citalopram (CELEXA) 40 MG tablet   Oral   Take 40 mg by mouth at  bedtime.         . clonazePAM (KLONOPIN) 1 MG tablet   Oral   Take 1 tablet (1 mg total) by mouth 2 (two) times daily.   30 tablet   0   . fexofenadine-pseudoephedrine (ALLEGRA-D 24) 180-240 MG per 24 hr tablet   Oral   Take 1 tablet by mouth every morning.         Marland Kitchen HYDROcodone-acetaminophen (NORCO) 10-325 MG per tablet   Oral   Take 1 tablet by mouth at bedtime as needed. For pain   10 tablet   0   . ketotifen (ZADITOR) 0.025 % ophthalmic solution   Both Eyes   Place 1 drop into both eyes 2 (two) times daily.         Marland Kitchen lamoTRIgine (LAMICTAL) 100 MG tablet   Oral   Take 100 mg by mouth 2 (two) times daily.          Marland Kitchen levalbuterol (XOPENEX) 0.63 MG/3ML nebulizer solution   Nebulization   Take 3 mLs (0.63 mg total) by nebulization every 8 (eight) hours as needed for wheezing.   3 mL   0   . levETIRAcetam (KEPPRA) 250 MG tablet   Oral   Take 250 mg by mouth every 12 (twelve) hours.         Marland Kitchen levothyroxine (SYNTHROID, LEVOTHROID) 25 MCG tablet   Oral   Take 25 mcg by mouth daily before breakfast.          . LORazepam (ATIVAN) 1 MG tablet   Oral   Take 1 tablet (1 mg total) by mouth every 4 (four) hours as needed. For agitation   30 tablet   0   . montelukast (SINGULAIR) 10 MG tablet   Oral   Take 10 mg by mouth every morning.          . nystatin (MYCOSTATIN/NYSTOP) 100000 UNIT/GM POWD      Apply topically 2 times per day to intertrigenous rashed areas         . potassium citrate (UROCIT-K) 10 MEQ (1080 MG) SR tablet   Oral   Take 1 tablet (10 mEq total) by mouth 3 (three) times daily with meals.         . pravastatin (PRAVACHOL)  40 MG tablet   Oral   Take 40 mg by mouth daily.          Marland Kitchen Propylene Glycol (SYSTANE BALANCE) 0.6 % SOLN   Ophthalmic   Apply 1 drop to eye 4 (four) times daily as needed. For dry eyes         . risperiDONE (RISPERDAL) 2 MG tablet   Oral   Take 2 mg by mouth 2 (two) times daily.         Marland Kitchen senna  (SENOKOT) 8.6 MG TABS   Oral   Take 2 tablets (17.2 mg total) by mouth daily as needed.   120 each      . sodium chloride (OCEAN) 0.65 % nasal spray   Nasal   Place 1 spray into the nose 2 (two) times daily.         Marland Kitchen venlafaxine XR (EFFEXOR-XR) 150 MG 24 hr capsule   Oral   Take 150 mg by mouth daily with breakfast.         . vitamin C (ASCORBIC ACID) 500 MG tablet   Oral   Take 500 mg by mouth 2 (two) times daily.         . risperiDONE microspheres (RISPERDAL CONSTA) 25 MG injection   Intramuscular   Inject 25 mg into the muscle every 14 (fourteen) days.           BP 131/77  Pulse 108  Temp(Src) 97.6 F (36.4 C) (Oral)  Resp 16  SpO2 92%  LMP 03/27/2012  Physical Exam  Nursing note and vitals reviewed. Constitutional: She is oriented to person, place, and time. She appears well-developed and well-nourished. No distress.  HENT:  Head: Normocephalic and atraumatic.  Eyes: EOM are normal.  Neck: Normal range of motion.  Cardiovascular: Normal rate, regular rhythm and normal heart sounds.   Pulmonary/Chest: Effort normal and breath sounds normal.  Abdominal: Soft. She exhibits no distension. There is no tenderness.  Musculoskeletal: Normal range of motion.  Neurological: She is alert and oriented to person, place, and time.  Skin: Skin is warm and dry.  Psychiatric: She has a normal mood and affect. Judgment normal.    ED Course  Procedures (including critical care time)  Labs Reviewed  BASIC METABOLIC PANEL - Abnormal; Notable for the following:    Glucose, Bld 133 (*)    Creatinine, Ser 0.44 (*)    All other components within normal limits  URINALYSIS, ROUTINE W REFLEX MICROSCOPIC - Abnormal; Notable for the following:    APPearance CLOUDY (*)    Specific Gravity, Urine 1.035 (*)    Hgb urine dipstick LARGE (*)    Protein, ur 30 (*)    Leukocytes, UA SMALL (*)    All other components within normal limits  URINE CULTURE  CBC  URINE  MICROSCOPIC-ADD ON   No results found.  I reviewed available ER/hospitalization records through the EMR   1. Nausea vomiting and diarrhea       MDM  3:16 AM Patient feels better after and nausea medicine and fluids.  Her urine is difficult to interpret and therefore given her normal white blood cell count a urine culture will be sent and antibiotics held at this time.  She denies dysuria and urinary frequency.  Her abdomen is benign.  Vitals are without significant abnormality.  Lungs are clear on auscultation.  No cough or congestion to suggest pneumonia.  Discharge home in good condition.  PCP followup.  Likely viral nausea vomiting  and diarrhea.  Hydrated in the emergency room        Lyanne Co, MD 04/18/12 0319   Date: 04/18/2012  Rate: 109  Rhythm: Sinus tachycardia  QRS Axis: normal  Intervals: normal  ST/T Wave abnormalities: normal  Conduction Disutrbances: none  Narrative Interpretation:   Old EKG Reviewed: No significant changes noted     Lyanne Co, MD 04/18/12 [phone]

## 2012-04-19 LAB — URINE CULTURE: Colony Count: NO GROWTH

## 2012-05-02 DIAGNOSIS — R071 Chest pain on breathing: Secondary | ICD-10-CM | POA: Insufficient documentation

## 2012-05-02 DIAGNOSIS — E785 Hyperlipidemia, unspecified: Secondary | ICD-10-CM | POA: Insufficient documentation

## 2012-05-02 DIAGNOSIS — R109 Unspecified abdominal pain: Secondary | ICD-10-CM | POA: Insufficient documentation

## 2012-05-02 DIAGNOSIS — Z8719 Personal history of other diseases of the digestive system: Secondary | ICD-10-CM | POA: Insufficient documentation

## 2012-05-02 DIAGNOSIS — F411 Generalized anxiety disorder: Secondary | ICD-10-CM | POA: Insufficient documentation

## 2012-05-02 DIAGNOSIS — J40 Bronchitis, not specified as acute or chronic: Secondary | ICD-10-CM | POA: Insufficient documentation

## 2012-05-02 DIAGNOSIS — R112 Nausea with vomiting, unspecified: Secondary | ICD-10-CM | POA: Insufficient documentation

## 2012-05-02 DIAGNOSIS — Z86718 Personal history of other venous thrombosis and embolism: Secondary | ICD-10-CM | POA: Insufficient documentation

## 2012-05-02 DIAGNOSIS — G894 Chronic pain syndrome: Secondary | ICD-10-CM | POA: Insufficient documentation

## 2012-05-02 DIAGNOSIS — G40909 Epilepsy, unspecified, not intractable, without status epilepticus: Secondary | ICD-10-CM | POA: Insufficient documentation

## 2012-05-02 DIAGNOSIS — F329 Major depressive disorder, single episode, unspecified: Secondary | ICD-10-CM | POA: Insufficient documentation

## 2012-05-02 DIAGNOSIS — R197 Diarrhea, unspecified: Secondary | ICD-10-CM | POA: Insufficient documentation

## 2012-05-02 DIAGNOSIS — F29 Unspecified psychosis not due to a substance or known physiological condition: Secondary | ICD-10-CM | POA: Insufficient documentation

## 2012-05-02 DIAGNOSIS — Z862 Personal history of diseases of the blood and blood-forming organs and certain disorders involving the immune mechanism: Secondary | ICD-10-CM | POA: Insufficient documentation

## 2012-05-02 DIAGNOSIS — Z7982 Long term (current) use of aspirin: Secondary | ICD-10-CM | POA: Insufficient documentation

## 2012-05-02 DIAGNOSIS — F3289 Other specified depressive episodes: Secondary | ICD-10-CM | POA: Insufficient documentation

## 2012-05-02 DIAGNOSIS — G809 Cerebral palsy, unspecified: Secondary | ICD-10-CM | POA: Insufficient documentation

## 2012-05-02 DIAGNOSIS — Z79899 Other long term (current) drug therapy: Secondary | ICD-10-CM | POA: Insufficient documentation

## 2012-05-02 DIAGNOSIS — Z87448 Personal history of other diseases of urinary system: Secondary | ICD-10-CM | POA: Insufficient documentation

## 2012-05-02 DIAGNOSIS — I10 Essential (primary) hypertension: Secondary | ICD-10-CM | POA: Insufficient documentation

## 2012-05-02 DIAGNOSIS — Z8679 Personal history of other diseases of the circulatory system: Secondary | ICD-10-CM | POA: Insufficient documentation

## 2012-05-02 DIAGNOSIS — Z87442 Personal history of urinary calculi: Secondary | ICD-10-CM | POA: Insufficient documentation

## 2012-05-02 DIAGNOSIS — E039 Hypothyroidism, unspecified: Secondary | ICD-10-CM | POA: Insufficient documentation

## 2012-05-02 DIAGNOSIS — Z8701 Personal history of pneumonia (recurrent): Secondary | ICD-10-CM | POA: Insufficient documentation

## 2012-05-02 DIAGNOSIS — F70 Mild intellectual disabilities: Secondary | ICD-10-CM | POA: Insufficient documentation

## 2012-05-02 DIAGNOSIS — Z8709 Personal history of other diseases of the respiratory system: Secondary | ICD-10-CM | POA: Insufficient documentation

## 2012-05-03 ENCOUNTER — Encounter (HOSPITAL_COMMUNITY): Payer: Self-pay | Admitting: Emergency Medicine

## 2012-05-03 ENCOUNTER — Emergency Department (HOSPITAL_COMMUNITY): Payer: Medicare Other

## 2012-05-03 ENCOUNTER — Other Ambulatory Visit: Payer: Self-pay

## 2012-05-03 ENCOUNTER — Emergency Department (HOSPITAL_COMMUNITY)
Admission: EM | Admit: 2012-05-03 | Discharge: 2012-05-03 | Disposition: A | Discharge status: Home / Self Care | Payer: Medicare Other | Attending: Emergency Medicine | Admitting: Emergency Medicine

## 2012-05-03 DIAGNOSIS — R109 Unspecified abdominal pain: Secondary | ICD-10-CM

## 2012-05-03 DIAGNOSIS — R0789 Other chest pain: Secondary | ICD-10-CM

## 2012-05-03 DIAGNOSIS — J40 Bronchitis, not specified as acute or chronic: Secondary | ICD-10-CM

## 2012-05-03 LAB — COMPREHENSIVE METABOLIC PANEL
BUN: 13 mg/dL (ref 6–23)
CO2: 26 mEq/L (ref 19–32)
Chloride: 102 mEq/L (ref 96–112)
Creatinine, Ser: 0.45 mg/dL — ABNORMAL LOW (ref 0.50–1.10)
GFR calc non Af Amer: >90 mL/min (ref >90)
Glucose, Bld: 96 mg/dL (ref 70–99)
Total Bilirubin: 0.3 mg/dL (ref 0.3–1.2)

## 2012-05-03 LAB — CBC WITH DIFFERENTIAL/PLATELET
HCT: 39.8 % (ref 36.0–46.0)
Hemoglobin: 12.9 g/dL (ref 12.0–15.0)
Lymphocytes Relative: 28 % (ref 12–46)
MCHC: 32.4 g/dL (ref 30.0–36.0)
Monocytes Absolute: 0.9 10*3/uL (ref 0.1–1.0)
Monocytes Relative: 12 % (ref 3–12)
Neutro Abs: 4 10*3/uL (ref 1.7–7.7)
WBC: 7.2 10*3/uL (ref 4.0–10.5)

## 2012-05-03 LAB — TROPONIN I: Troponin I: <0.3 ng/mL (ref <0.30)

## 2012-05-03 MED ORDER — MUCINEX DM 30-600 MG PO TB12: 1.0000 | ORAL_TABLET | Freq: Two times a day (BID) | ORAL | Status: DC | PRN

## 2012-05-03 MED ORDER — SODIUM CHLORIDE 0.9 % IV BOLUS (SEPSIS)
1000.0000 mL | Freq: Once | INTRAVENOUS | Status: AC
  Administered 2012-05-03: 1000 mL via INTRAVENOUS

## 2012-05-03 MED ORDER — LEVOFLOXACIN 250 MG PO TABS: 750.0000 mg | ORAL_TABLET | Freq: Every day | ORAL | Status: DC

## 2012-05-03 NOTE — ED Notes (Signed)
XR at bedside

## 2012-05-03 NOTE — ED Notes (Signed)
Report given to [facility], [place]

## 2012-05-03 NOTE — ED Notes (Signed)
D/C Instructions, prescriptions, and nursing home paper work sent with [employer]

## 2012-05-03 NOTE — ED Notes (Signed)
Pt arrived from [facility] with a complaint of chest pain.  Pt stated to EMS that she had abdominal pain, arm pain and leg pain that started about two weeks.  Pt stated to Nursing staff that she believes she has pneumonia.

## 2012-05-03 NOTE — ED Notes (Signed)
[facility] Contacted.

## 2012-05-03 NOTE — ED Provider Notes (Signed)
History     CSN: [id number]  Arrival date & time 05/02/12  2356   First MD Initiated Contact with Patient 05/03/12 0106      Chief Complaint  Patient presents with  . Chest Pain  . Abdominal Pain    (Consider location/radiation/quality/duration/timing/severity/associated sxs/prior treatment) HPI  Patient states "I think I have pneumonia again". Patient states she's had a cough for 2 weeks of green sputum production. She states she had a fever of 102 to 3 days ago. She states she feels short of breath. She has nausea and vomiting today with diarrhea. Patient states her abdomen and chest hurts when she coughs.  PCP Dr Kerry Dory  Past Medical History  Diagnosis Date  . Sepsis(995.91)   . Hypertension   . DVT (deep vein thrombosis) in pregnancy   . Depression   . Chronic pain syndrome   . Allergic rhinitis   . Acute respiratory failure   . Epilepsy   . Migraine   . Chronic airway obstruction   . Cerebral palsy   . Dysphagia   . Hiatal hernia   . Morbid obesity 09-25-11    requires Hoyer lift. pt. doesn't stand or ambulate.  . Incontinent of urine 09-25-11    hx.  . Incontinent of feces 09-25-11    hx.  . Vitamin D deficiency   . Sepsis(995.91)     hx of   . Hyperlipidemia   . Mild intellectual disabilities   . Neurogenic bladder   . Hypothyroidism   . Anxiety   . Unspecified psychosis   . Muscle weakness   . Dysphasia   . Pneumonia     hx of asp pneumonia 1/11-1/19/12  . Sacral decubitus ulcer     hx of  . Nephrolithiasis 04/05/2012    Past Surgical History  Procedure Laterality Date  . Nephrolithotomy  10/07/2011    Procedure: NEPHROLITHOTOMY PERCUTANEOUS;  Surgeon: Marcine Matar, MD;  Location: [facility];  Service: Urology;  Laterality: Right;  kidney   . Cystoscopy with ureteroscopy  11/07/2011    Procedure: CYSTOSCOPY WITH URETEROSCOPY;  Surgeon: Marcine Matar, MD;  Location: [facility];  Service: Urology;  Laterality: Right;  Removal of right double J  stent, Insertion right double J stent  . Stone extraction with basket  11/07/2011    Procedure: STONE EXTRACTION WITH BASKET;  Surgeon: Marcine Matar, MD;  Location: [facility];  Service: Urology;  Laterality: Right;  . Nephrolithotomy  12/06/2011    Procedure: NEPHROLITHOTOMY PERCUTANEOUS;  Surgeon: Marcine Matar, MD;  Location: [facility];  Service: Urology;  Laterality: Right;   REPEAT PCNL     History reviewed. No pertinent family history.  History  Substance Use Topics  . Smoking status: Never Smoker   . Smokeless tobacco: Never Used  . Alcohol Use: No  lives in NH  OB History   Grav Para Term Preterm Abortions TAB SAB Ect Mult Living                  Review of Systems  All other systems reviewed and are negative.    Allergies  Sulfa antibiotics and Tape  Home Medications   Current Outpatient Rx  Name  Route  Sig  Dispense  Refill  . acetaminophen (TYLENOL) 500 MG tablet   Oral   Take 1,000 mg by mouth 2 (two) times daily. scheduled         . alum & mag hydroxide-simeth (MAALOX/MYLANTA) 200-200-20 MG/5ML suspension   Oral   Take 30  mLs by mouth every 4 (four) hours as needed for indigestion. For heart burn         . aspirin EC 81 MG tablet   Oral   Take 81 mg by mouth daily.         Marland Kitchen azelastine (ASTELIN) 137 MCG/SPRAY nasal spray   Nasal   Place 1 spray into the nose 2 (two) times daily. For allergies         . baclofen (LIORESAL) 10 MG tablet   Oral   Take 10 mg by mouth 4 (four) times daily.          . budesonide-formoterol (SYMBICORT) 160-4.5 MCG/ACT inhaler   Inhalation   Inhale 1 puff into the lungs 2 (two) times daily.         . Cholecalciferol (VITAMIN D) 400 UNITS capsule   Oral   Take 400 Units by mouth every morning.          . citalopram (CELEXA) 40 MG tablet   Oral   Take 40 mg by mouth at bedtime.         . clonazePAM (KLONOPIN) 1 MG tablet   Oral   Take 1 tablet (1 mg total) by mouth 2 (two) times daily.   30  tablet   0   . fexofenadine-pseudoephedrine (ALLEGRA-D 24) 180-240 MG per 24 hr tablet   Oral   Take 1 tablet by mouth every morning.         Marland Kitchen HYDROcodone-acetaminophen (NORCO) 10-325 MG per tablet   Oral   Take 1 tablet by mouth at bedtime as needed. For pain   10 tablet   0   . ketotifen (ZADITOR) 0.025 % ophthalmic solution   Both Eyes   Place 1 drop into both eyes 2 (two) times daily.         Marland Kitchen lamoTRIgine (LAMICTAL) 100 MG tablet   Oral   Take 100 mg by mouth 2 (two) times daily.          Marland Kitchen levalbuterol (XOPENEX) 0.63 MG/3ML nebulizer solution   Nebulization   Take 3 mLs (0.63 mg total) by nebulization every 8 (eight) hours as needed for wheezing.   3 mL   0   . levETIRAcetam (KEPPRA) 250 MG tablet   Oral   Take 250 mg by mouth every 12 (twelve) hours.         Marland Kitchen levothyroxine (SYNTHROID, LEVOTHROID) 25 MCG tablet   Oral   Take 25 mcg by mouth daily before breakfast.          . LORazepam (ATIVAN) 1 MG tablet   Oral   Take 1 tablet (1 mg total) by mouth every 4 (four) hours as needed. For agitation   30 tablet   0   . montelukast (SINGULAIR) 10 MG tablet   Oral   Take 10 mg by mouth every morning.          . nystatin (MYCOSTATIN/NYSTOP) 100000 UNIT/GM POWD      Apply topically 2 times per day to intertrigenous rashed areas         . potassium citrate (UROCIT-K) 10 MEQ (1080 MG) SR tablet   Oral   Take 1 tablet (10 mEq total) by mouth 3 (three) times daily with meals.         . pravastatin (PRAVACHOL) 40 MG tablet   Oral   Take 40 mg by mouth daily.          Marland Kitchen Propylene Glycol (SYSTANE BALANCE)  0.6 % SOLN   Ophthalmic   Apply 1 drop to eye 4 (four) times daily as needed. For dry eyes         . risperiDONE (RISPERDAL) 2 MG tablet   Oral   Take 2 mg by mouth 2 (two) times daily.         . risperiDONE microspheres (RISPERDAL CONSTA) 25 MG injection   Intramuscular   Inject 25 mg into the muscle every 14 (fourteen) days.          Marland Kitchen senna (SENOKOT) 8.6 MG TABS   Oral   Take 2 tablets (17.2 mg total) by mouth daily as needed.   120 each      . sodium chloride (OCEAN) 0.65 % nasal spray   Nasal   Place 1 spray into the nose 2 (two) times daily.         Marland Kitchen venlafaxine XR (EFFEXOR-XR) 150 MG 24 hr capsule   Oral   Take 150 mg by mouth daily with breakfast.         . vitamin C (ASCORBIC ACID) 500 MG tablet   Oral   Take 500 mg by mouth 2 (two) times daily.         Marland Kitchen albuterol (PROVENTIL HFA;VENTOLIN HFA) 108 (90 BASE) MCG/ACT inhaler   Inhalation   Inhale 2 puffs into the lungs every 4 (four) hours as needed for wheezing or shortness of breath.           BP 150/69  Pulse 91  Temp(Src) 98.2 F (36.8 C) (Oral)  Resp 20  SpO2 94%  LMP 04/19/2012  Vital signs normal    Physical Exam  Nursing note and vitals reviewed. Constitutional: She is oriented to person, place, and time. She appears well-developed and well-nourished.  Non-toxic appearance. She does not appear ill. No distress.  Obese, speech hard to understand, pt edentulous  HENT:  Head: Normocephalic and atraumatic.  Right Ear: External ear normal.  Left Ear: External ear normal.  Nose: Nose normal. No mucosal edema or rhinorrhea.  Mouth/Throat: Oropharynx is clear and moist and mucous membranes are normal. No dental abscesses or edematous.  edentulous  Eyes: Conjunctivae and EOM are normal. Pupils are equal, round, and reactive to light.  Neck: Normal range of motion and full passive range of motion without pain. Neck supple.  Cardiovascular: Normal rate, regular rhythm and normal heart sounds.  Exam reveals no gallop and no friction rub.   No murmur heard. Pulmonary/Chest: Effort normal and breath sounds normal. No respiratory distress. She has no wheezes. She has no rhonchi. She has no rales. She exhibits no tenderness and no crepitus.  Has hard coughing without mucus production  Abdominal: Soft. Normal appearance and bowel  sounds are normal. She exhibits no distension. There is no tenderness. There is no rebound and no guarding.  Musculoskeletal: Normal range of motion. She exhibits no edema and no tenderness.  Moves all extremities well.   Neurological: She is alert and oriented to person, place, and time. She has normal strength. No cranial nerve deficit.  Skin: Skin is warm, dry and intact. No rash noted. No erythema. No pallor.  Psychiatric: She has a normal mood and affect. Her speech is normal and behavior is normal. Her mood appears not anxious.    ED Course  Procedures (including critical care time)    Patient's x-ray and labs are all normal today. I will send her home and have her treated for bronchitis.  Nurses report no  vomiting or diarrhea while in the ED.  Results for orders placed during the hospital encounter of 05/03/12  CBC WITH DIFFERENTIAL      Result Value Range   WBC 7.2  4.0 - 10.5 K/uL   RBC 4.27  3.87 - 5.11 MIL/uL   Hemoglobin 12.9  12.0 - 15.0 g/dL   HCT 45.4  09.8 - 11.9 %   MCV 93.2  78.0 - 100.0 fL   MCH 30.2  26.0 - 34.0 pg   MCHC 32.4  30.0 - 36.0 g/dL   RDW 14.7  82.9 - 56.2 %   Platelets 160  150 - 400 K/uL   Neutrophils Relative 55  43 - 77 %   Neutro Abs 4.0  1.7 - 7.7 K/uL   Lymphocytes Relative 28  12 - 46 %   Lymphs Abs 2.0  0.7 - 4.0 K/uL   Monocytes Relative 12  3 - 12 %   Monocytes Absolute 0.9  0.1 - 1.0 K/uL   Eosinophils Relative 5  0 - 5 %   Eosinophils Absolute 0.3  0.0 - 0.7 K/uL   Basophils Relative 0  0 - 1 %   Basophils Absolute 0.0  0.0 - 0.1 K/uL  COMPREHENSIVE METABOLIC PANEL      Result Value Range   Sodium 137  135 - 145 mEq/L   Potassium 4.7  3.5 - 5.1 mEq/L   Chloride 102  96 - 112 mEq/L   CO2 26  19 - 32 mEq/L   Glucose, Bld 96  70 - 99 mg/dL   BUN 13  6 - 23 mg/dL   Creatinine, Ser 1.30 (*) 0.50 - 1.10 mg/dL   Calcium 9.2  8.4 - 86.5 mg/dL   Total Protein 6.2  6.0 - 8.3 g/dL   Albumin 3.6  3.5 - 5.2 g/dL   AST 34  0 - 37 U/L    ALT 39 (*) 0 - 35 U/L   Alkaline Phosphatase 52  39 - 117 U/L   Total Bilirubin 0.3  0.3 - 1.2 mg/dL   GFR calc non Af Amer >90  >90 mL/min   GFR calc Af Amer >90  >90 mL/min  TROPONIN I      Result Value Range   Troponin I <0.30  <0.30 ng/mL   Laboratory interpretation all normal  Dg Chest Portable 1 View  05/03/2012  *RADIOLOGY REPORT*  Clinical Data: Chest pain  PORTABLE CHEST - 1 VIEW  Comparison: 04/03/2012 CT  Findings: Normal heart size.  Normal caliber aorta.  Lungs clear. Thyroid mass displaces the trachea rightward.  Mild curvature of the spine.  No pleural effusion or pneumothorax.  IMPRESSION: No radiographic evidence of acute cardiopulmonary process.   Original Report Authenticated By: Jearld Lesch, M.D.       Ct Abdomen Pelvis Wo Contrast  04/04/2012  .  IMPRESSION: 1.  Interval complete clearance of previously noted excreted contrast. 2.  Significant reduction in previously noted right-sided staghorn calculi with 3 tiny residual nonobstructing right-sided renal stones remaining, dominant stone measuring 4 mm in diameter.  No left-sided nephrolithiasis.  No urinary obstruction. 3.  Urinary bladder decompressed with a Foley catheter. 4.  Colonic diverticulosis without evidence of diverticulitis.   Original Report Authenticated By: Tacey Ruiz, MD    Ct Abdomen Pelvis Wo Contrast  04/03/2012  *  IMPRESSION:  1.  No hydronephrosis or hydroureter.  I cannot exclude nonobstructive calculi due to the presence of contrast in the collecting  systems.  There is a small calyceal diverticulum of the right kidney upper pole subtending right upper pole scarring. 2.  Linear subsegmental atelectasis in both lower lobes. 3.  Scattered sigmoid and descending colon diverticula. 4.  Cystic left adnexal/ovarian lesion measures up to 2.8 cm in size.  This is new compared to 11/28/2011.  Probably simply represents an ovarian cyst.  Given its size and to the patient's expected premenopausal status,  no further workup is required.  This recommendation follows ACR consensus guidelines:  White Paper of the ACR Incidental Findings Committee II on Adnexal Findings.  J Am Coll Radiol 2013:10:675-681. 5.  Exaggerated lumbar lordosis.   Original Report Authenticated By: Gaylyn Rong, M.D.     Ct Angio Chest Pe W/cm &/or Wo Cm  04/03/2012  .  IMPRESSION: Nondiagnostic for pulmonary embolism. Recommend repeat examination or VQ scan if clinical concern persists.  Bibasilar atelectasis.  Enlarged left lobe of the thyroid gland.  This has considerably enlarged since 2006.  Recommend thyroid ultrasound.   Original Report Authenticated By: Jearld Lesch, M.D.    US Soft Tissue Head/neck  04/03/2012  *.  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  Recommend tissue sampling.   Original Report Authenticated By: Charlett Nose, M.D.    Nm Pulmonary Perf And Vent  04/03/2012    IMPRESSION: Normal study.   Original Report Authenticated By: Andreas Newport, M.D.    US Thyroid Biopsy  04/06/2012   IMPRESSION: Ultrasound guided needle aspirate biopsy performed of the left thyroid nodule.  Read by: Anselm Pancoast, P.A.-C   Original Report Authenticated By: Irish Lack, M.D.      Date: 05/03/2012  Rate: 93  Rhythm: normal sinus rhythm  QRS Axis: normal  Intervals: normal  ST/T Wave abnormalities: normal  Conduction Disutrbances:none  Narrative Interpretation:   Old EKG Reviewed: none available    1. Bronchitis   2. Chest wall pain   3. Abdominal wall pain      New Prescriptions   DEXTROMETHORPHAN-GUAIFENESIN (MUCINEX DM) 30-600 MG TB12    Take 1 tablet by mouth 2 (two) times daily as needed (cough).   LEVOFLOXACIN (LEVAQUIN) 250 MG TABLET    Take 3 tablets (750 mg total) by mouth daily.    Plan discharge  Devoria Albe, MD, FACEP    MDM          Ward Givens, MD 05/03/12 [phone]

## 2012-05-03 NOTE — ED Notes (Signed)
Bed:WA18<BR> Expected date:<BR> Expected time:<BR> Means of arrival:<BR> Comments:<BR> EMS

## 2012-08-26 ENCOUNTER — Emergency Department (HOSPITAL_COMMUNITY)
Admission: EM | Admit: 2012-08-26 | Discharge: 2012-08-26 | Disposition: A | Discharge status: Home / Self Care | Payer: Medicare Other | Attending: Emergency Medicine | Admitting: Emergency Medicine

## 2012-08-26 ENCOUNTER — Emergency Department (HOSPITAL_COMMUNITY): Payer: Medicare Other

## 2012-08-26 ENCOUNTER — Encounter (HOSPITAL_COMMUNITY): Payer: Self-pay

## 2012-08-26 DIAGNOSIS — Z87448 Personal history of other diseases of urinary system: Secondary | ICD-10-CM | POA: Insufficient documentation

## 2012-08-26 DIAGNOSIS — Z79899 Other long term (current) drug therapy: Secondary | ICD-10-CM | POA: Insufficient documentation

## 2012-08-26 DIAGNOSIS — G40909 Epilepsy, unspecified, not intractable, without status epilepticus: Secondary | ICD-10-CM | POA: Insufficient documentation

## 2012-08-26 DIAGNOSIS — Z87442 Personal history of urinary calculi: Secondary | ICD-10-CM | POA: Insufficient documentation

## 2012-08-26 DIAGNOSIS — I10 Essential (primary) hypertension: Secondary | ICD-10-CM | POA: Insufficient documentation

## 2012-08-26 DIAGNOSIS — Z7982 Long term (current) use of aspirin: Secondary | ICD-10-CM | POA: Insufficient documentation

## 2012-08-26 DIAGNOSIS — F411 Generalized anxiety disorder: Secondary | ICD-10-CM | POA: Insufficient documentation

## 2012-08-26 DIAGNOSIS — F3289 Other specified depressive episodes: Secondary | ICD-10-CM | POA: Insufficient documentation

## 2012-08-26 DIAGNOSIS — Z8669 Personal history of other diseases of the nervous system and sense organs: Secondary | ICD-10-CM | POA: Insufficient documentation

## 2012-08-26 DIAGNOSIS — S0003XA Contusion of scalp, initial encounter: Secondary | ICD-10-CM | POA: Insufficient documentation

## 2012-08-26 DIAGNOSIS — Z8701 Personal history of pneumonia (recurrent): Secondary | ICD-10-CM | POA: Insufficient documentation

## 2012-08-26 DIAGNOSIS — Y939 Activity, unspecified: Secondary | ICD-10-CM | POA: Insufficient documentation

## 2012-08-26 DIAGNOSIS — S3981XA Other specified injuries of abdomen, initial encounter: Secondary | ICD-10-CM | POA: Insufficient documentation

## 2012-08-26 DIAGNOSIS — Z8719 Personal history of other diseases of the digestive system: Secondary | ICD-10-CM | POA: Insufficient documentation

## 2012-08-26 DIAGNOSIS — Z8639 Personal history of other endocrine, nutritional and metabolic disease: Secondary | ICD-10-CM | POA: Insufficient documentation

## 2012-08-26 DIAGNOSIS — S0993XA Unspecified injury of face, initial encounter: Secondary | ICD-10-CM | POA: Insufficient documentation

## 2012-08-26 DIAGNOSIS — Y9210 Unspecified residential institution as the place of occurrence of the external cause: Secondary | ICD-10-CM | POA: Insufficient documentation

## 2012-08-26 DIAGNOSIS — J449 Chronic obstructive pulmonary disease, unspecified: Secondary | ICD-10-CM | POA: Insufficient documentation

## 2012-08-26 DIAGNOSIS — W06XXXA Fall from bed, initial encounter: Secondary | ICD-10-CM | POA: Insufficient documentation

## 2012-08-26 DIAGNOSIS — E039 Hypothyroidism, unspecified: Secondary | ICD-10-CM | POA: Insufficient documentation

## 2012-08-26 DIAGNOSIS — F70 Mild intellectual disabilities: Secondary | ICD-10-CM | POA: Insufficient documentation

## 2012-08-26 DIAGNOSIS — F329 Major depressive disorder, single episode, unspecified: Secondary | ICD-10-CM | POA: Insufficient documentation

## 2012-08-26 DIAGNOSIS — R0781 Pleurodynia: Secondary | ICD-10-CM

## 2012-08-26 DIAGNOSIS — R1012 Left upper quadrant pain: Secondary | ICD-10-CM | POA: Insufficient documentation

## 2012-08-26 DIAGNOSIS — W19XXXA Unspecified fall, initial encounter: Secondary | ICD-10-CM

## 2012-08-26 DIAGNOSIS — H519 Unspecified disorder of binocular movement: Secondary | ICD-10-CM | POA: Insufficient documentation

## 2012-08-26 DIAGNOSIS — E785 Hyperlipidemia, unspecified: Secondary | ICD-10-CM | POA: Insufficient documentation

## 2012-08-26 DIAGNOSIS — Z872 Personal history of diseases of the skin and subcutaneous tissue: Secondary | ICD-10-CM | POA: Insufficient documentation

## 2012-08-26 DIAGNOSIS — S0990XA Unspecified injury of head, initial encounter: Secondary | ICD-10-CM | POA: Insufficient documentation

## 2012-08-26 DIAGNOSIS — G894 Chronic pain syndrome: Secondary | ICD-10-CM | POA: Insufficient documentation

## 2012-08-26 DIAGNOSIS — J4489 Other specified chronic obstructive pulmonary disease: Secondary | ICD-10-CM | POA: Insufficient documentation

## 2012-08-26 DIAGNOSIS — G43909 Migraine, unspecified, not intractable, without status migrainosus: Secondary | ICD-10-CM | POA: Insufficient documentation

## 2012-08-26 DIAGNOSIS — S298XXA Other specified injuries of thorax, initial encounter: Secondary | ICD-10-CM | POA: Insufficient documentation

## 2012-08-26 DIAGNOSIS — Z8709 Personal history of other diseases of the respiratory system: Secondary | ICD-10-CM | POA: Insufficient documentation

## 2012-08-26 DIAGNOSIS — Z862 Personal history of diseases of the blood and blood-forming organs and certain disorders involving the immune mechanism: Secondary | ICD-10-CM | POA: Insufficient documentation

## 2012-08-26 DIAGNOSIS — Z86718 Personal history of other venous thrombosis and embolism: Secondary | ICD-10-CM | POA: Insufficient documentation

## 2012-08-26 NOTE — ED Provider Notes (Signed)
Medical screening examination/treatment/procedure(s) were performed by non-physician practitioner and as supervising physician I was immediately available for consultation/collaboration.  Sunnie Nielsen, MD 08/26/12 [phone]

## 2012-08-26 NOTE — ED Notes (Signed)
PTAR upon discharge concerned with pt mental status.  Pt talking and answering questions.  Concerned speech was slower than usual (than several weeks ago).  EDPA Mckinley Jewel. called and made aware of PTAR concern and given update on pt current status and vitals .  EDPA reports OK for discharge, Can return with status changes.  PTAR given OK for discharge.  DNR yellow copy with pt.

## 2012-08-26 NOTE — ED Notes (Signed)
Pt had an unwitnessed fall at her facility this am, no seen lacerations but her face was covered in blood. Pt has cerebral palsy and an extensive psych history which she has been taken off her medications for two days and staff say that she's been talking out of her head.

## 2012-08-26 NOTE — ED Provider Notes (Signed)
History    CSN: [id number] Arrival date & time 08/26/12  4098  First MD Initiated Contact with Patient 08/26/12 [phone]     Chief Complaint  Patient presents with  . Fall  . Laceration   (Consider location/radiation/quality/duration/timing/severity/associated sxs/prior Treatment) HPI Comments: Patient is a 47 year old female with a past medical history of cerebral palsy and extensive psychiatric history who presents to the ED via EMS from a nursing facility after falling out of bed this morning. The was unwitnessed Patient reports rolling over to her left side and fell out of bed. Patient reports landing on her left side on the floor. She denies LOC but does report neck pain, left head pain, left chest pain, and left abdominal pain. The pain is throbbing and severe without radiation from localized affected area. Patient is upset because she feels as though she is not cared for properly at her living facility. Palpation of the areas makes the pain worse. Nothing makes the pain better. No other associated symptoms.   Past Medical History  Diagnosis Date  . Sepsis(995.91)   . Hypertension   . DVT (deep vein thrombosis) in pregnancy   . Depression   . Chronic pain syndrome   . Allergic rhinitis   . Acute respiratory failure   . Epilepsy   . Migraine   . Chronic airway obstruction   . Cerebral palsy   . Dysphagia   . Hiatal hernia   . Morbid obesity 09-25-11    requires Hoyer lift. pt. doesn't stand or ambulate.  . Incontinent of urine 09-25-11    hx.  . Incontinent of feces 09-25-11    hx.  . Vitamin D deficiency   . Sepsis(995.91)     hx of   . Hyperlipidemia   . Mild intellectual disabilities   . Neurogenic bladder   . Hypothyroidism   . Anxiety   . Unspecified psychosis   . Muscle weakness   . Dysphasia   . Pneumonia     hx of asp pneumonia 1/11-1/19/12  . Sacral decubitus ulcer     hx of  . Nephrolithiasis 04/05/2012   Past Surgical History  Procedure Laterality Date   . Nephrolithotomy  10/07/2011    Procedure: NEPHROLITHOTOMY PERCUTANEOUS;  Surgeon: Marcine Matar, MD;  Location: [facility];  Service: Urology;  Laterality: Right;  kidney   . Cystoscopy with ureteroscopy  11/07/2011    Procedure: CYSTOSCOPY WITH URETEROSCOPY;  Surgeon: Marcine Matar, MD;  Location: [facility];  Service: Urology;  Laterality: Right;  Removal of right double J stent, Insertion right double J stent  . Stone extraction with basket  11/07/2011    Procedure: STONE EXTRACTION WITH BASKET;  Surgeon: Marcine Matar, MD;  Location: [facility];  Service: Urology;  Laterality: Right;  . Nephrolithotomy  12/06/2011    Procedure: NEPHROLITHOTOMY PERCUTANEOUS;  Surgeon: Marcine Matar, MD;  Location: [facility];  Service: Urology;  Laterality: Right;   REPEAT PCNL    History reviewed. No pertinent family history. History  Substance Use Topics  . Smoking status: Never Smoker   . Smokeless tobacco: Never Used  . Alcohol Use: No   OB History   Grav Para Term Preterm Abortions TAB SAB Ect Mult Living                 Review of Systems  Cardiovascular: Positive for chest pain.  Gastrointestinal: Positive for abdominal pain.  Neurological: Positive for headaches.  All other systems reviewed and are negative.  Allergies  Sulfa antibiotics and Tape  Home Medications   Current Outpatient Rx  Name  Route  Sig  Dispense  Refill  . albuterol (PROVENTIL HFA;VENTOLIN HFA) 108 (90 BASE) MCG/ACT inhaler   Inhalation   Inhale 2 puffs into the lungs every 4 (four) hours as needed for wheezing or shortness of breath.         Marland Kitchen aspirin EC 81 MG tablet   Oral   Take 81 mg by mouth daily.         . baclofen (LIORESAL) 10 MG tablet   Oral   Take 10 mg by mouth 4 (four) times daily.          Marland Kitchen HYDROcodone-acetaminophen (NORCO) 10-325 MG per tablet   Oral   Take 1 tablet by mouth at bedtime as needed. For pain   10 tablet   0   . ketotifen (ZADITOR) 0.025 % ophthalmic solution    Both Eyes   Place 1 drop into both eyes 2 (two) times daily.         Marland Kitchen lamoTRIgine (LAMICTAL) 100 MG tablet   Oral   Take 100 mg by mouth 2 (two) times daily.          Marland Kitchen levalbuterol (XOPENEX) 0.63 MG/3ML nebulizer solution   Nebulization   Take 3 mLs (0.63 mg total) by nebulization every 8 (eight) hours as needed for wheezing.   3 mL   0   . levETIRAcetam (KEPPRA) 250 MG tablet   Oral   Take 250 mg by mouth every 12 (twelve) hours.         Marland Kitchen levothyroxine (SYNTHROID, LEVOTHROID) 25 MCG tablet   Oral   Take 25 mcg by mouth daily before breakfast.          . montelukast (SINGULAIR) 10 MG tablet   Oral   Take 10 mg by mouth every morning.          . nystatin (MYCOSTATIN/NYSTOP) 100000 UNIT/GM POWD      Apply topically 2 times per day to intertrigenous rashed areas         . potassium citrate (UROCIT-K) 10 MEQ (1080 MG) SR tablet   Oral   Take 1 tablet (10 mEq total) by mouth 3 (three) times daily with meals.         . pravastatin (PRAVACHOL) 40 MG tablet   Oral   Take 40 mg by mouth daily.          Marland Kitchen Propylene Glycol (SYSTANE BALANCE) 0.6 % SOLN   Ophthalmic   Apply 1 drop to eye 4 (four) times daily as needed. For dry eyes         . sodium chloride (OCEAN) 0.65 % nasal spray   Nasal   Place 1 spray into the nose 2 (two) times daily.         Marland Kitchen venlafaxine XR (EFFEXOR-XR) 150 MG 24 hr capsule   Oral   Take 150 mg by mouth daily with breakfast.         . vitamin C (ASCORBIC ACID) 500 MG tablet   Oral   Take 500 mg by mouth 2 (two) times daily.          BP 123/70  Pulse 117  Temp(Src) 98.4 F (36.9 C) (Oral)  Resp 18  SpO2 90%  Physical Exam  Nursing note and vitals reviewed. Constitutional: She is oriented to person, place, and time. She appears well-developed and well-nourished. No distress.  HENT:  Head:  Normocephalic and atraumatic.  Large hematoma to left temporal area that is tender to palpation. Dried blood noted in mouth and  nose-unsure of source of blood as there is no open wound.   Eyes: Conjunctivae are normal.  Strabismus of right eye noted.   Neck:  ROM limited due to pain.   Cardiovascular: Normal rate and regular rhythm.  Exam reveals no gallop and no friction rub.   No murmur heard. Pulmonary/Chest: Effort normal and breath sounds normal. She has no wheezes. She has no rales. She exhibits tenderness.  Left lateral chest tenderness to palpation. No obvious deformity.   Abdominal: Soft. She exhibits no distension. There is tenderness. There is no rebound and no guarding.  LUQ tenderness to palpation.   Musculoskeletal: Normal range of motion.  Cervical spine tenderness to palpation. No other midline spine tenderness to palpation. No step off or deformity noted.   Neurological: She is alert and oriented to person, place, and time.  Speech is goal-oriented.   Skin: Skin is warm and dry.  Psychiatric: She has a normal mood and affect. Her behavior is normal.    ED Course  Procedures (including critical care time)  Labs Reviewed - No data to display   Ct Abdomen Pelvis Wo Contrast  08/26/2012   *RADIOLOGY REPORT*  Clinical Data: Evaluate for stone.  Sacral decubitus ulcer.  Pain.  CT ABDOMEN AND PELVIS WITHOUT CONTRAST  Technique:  Multidetector CT imaging of the abdomen and pelvis was performed following the standard protocol without intravenous contrast.  Comparison: 04/04/2012.  Findings: Lung bases show scattered atelectasis.  Heart size normal.  No pericardial or pleural effusion.  Liver, gallbladder and adrenal glands are unremarkable.  Stones are seen in the kidneys bilaterally measuring up to 6 mm on the right. No ureteral stones or obstruction.  Kidneys, spleen, pancreas, stomach and bowel are otherwise unremarkable.  Asymmetric atrophy of the left psoas and iliac vasculature is noted.  Small periumbilical hernia contains unobstructed bowel.  Uterus and ovaries are visualized.  Prominent vascular  collaterals are seen in the subcutaneous fat of the lower anterior pelvic wall. No pathologically enlarged lymph nodes.  No free fluid.  No worrisome lytic or sclerotic lesions. Very mild compression of the L4 superior endplate is unchanged.  A known sacral decubitus ulcer is not readily appreciated.  IMPRESSION:  1.  Bilateral renal stones without obstruction. 2.  A known sacral decubitus ulcer is not readily appreciated.   Original Report Authenticated By: Leanna Battles, M.D.   Dg Chest 1 View  08/26/2012   *RADIOLOGY REPORT*  Clinical Data: Fall.  CHEST - 1 VIEW  Comparison: 05/03/2012.  Findings: Patient is rotated.  Trachea appears deviated to the right, which can be seen with left thyroid enlargement.  Lungs are somewhat low in volume with minimal left perihilar bibasilar atelectasis.  Possible tiny left pleural effusion.  Left apical pleural thickening.  IMPRESSION:  Low lung volumes with left perihilar and bibasilar atelectasis. Possible tiny left pleural effusion.   Original Report Authenticated By: Leanna Battles, M.D.   Ct Head Wo Contrast  08/26/2012   *RADIOLOGY REPORT*  Clinical Data:  Unwitnessed fall.  Face covered in blood. Epilepsy.  CT HEAD WITHOUT CONTRAST CT CERVICAL SPINE WITHOUT CONTRAST  Technique:  Multidetector CT imaging of the head and cervical spine was performed following the standard protocol without intravenous contrast.  Multiplanar CT image reconstructions of the cervical spine were also generated.  Comparison:  CT head 04/16/2010 and CT head cervical  spine 11/18/2004.  CT HEAD  Findings: No evidence of an acute infarct, acute hemorrhage, mass lesion, mass effect or hydrocephalus.  Periventricular low attenuation. There is scattered opacification of the paranasal sinuses, worst in the left maxillary sinus. Mastoid air cells are clear.  No fracture.  IMPRESSION:  1.  No acute intracranial abnormality. 2.  Chronic microvascular white matter ischemic changes. 3.  Fairly  extensive paranasal sinus inflammatory changes.  CT CERVICAL SPINE  Findings: There is straightening of the normal cervical lordosis without subluxation or fracture.  Endplate degenerative changes and loss of disc space height are worst at C4-5 and C5-6.  Left lobe of the thyroid is asymmetrically enlarged, measuring approximately 5.9 x 5.6 cm, with rightward deviation of the trachea, which is slightly narrowed but remains patent.  Visualized lung apices show no acute findings.  IMPRESSION:  1.  Straightening of the normal cervical lordosis with spondylosis. No fracture or subluxation. 2.  Markedly enlarged left lobe of thyroid.   Original Report Authenticated By: Leanna Battles, M.D.   Ct Cervical Spine Wo Contrast  08/26/2012   *RADIOLOGY REPORT*  Clinical Data:  Unwitnessed fall.  Face covered in blood. Epilepsy.  CT HEAD WITHOUT CONTRAST CT CERVICAL SPINE WITHOUT CONTRAST  Technique:  Multidetector CT imaging of the head and cervical spine was performed following the standard protocol without intravenous contrast.  Multiplanar CT image reconstructions of the cervical spine were also generated.  Comparison:  CT head 04/16/2010 and CT head cervical spine 11/18/2004.  CT HEAD  Findings: No evidence of an acute infarct, acute hemorrhage, mass lesion, mass effect or hydrocephalus.  Periventricular low attenuation. There is scattered opacification of the paranasal sinuses, worst in the left maxillary sinus. Mastoid air cells are clear.  No fracture.  IMPRESSION:  1.  No acute intracranial abnormality. 2.  Chronic microvascular white matter ischemic changes. 3.  Fairly extensive paranasal sinus inflammatory changes.  CT CERVICAL SPINE  Findings: There is straightening of the normal cervical lordosis without subluxation or fracture.  Endplate degenerative changes and loss of disc space height are worst at C4-5 and C5-6.  Left lobe of the thyroid is asymmetrically enlarged, measuring approximately 5.9 x 5.6 cm, with  rightward deviation of the trachea, which is slightly narrowed but remains patent.  Visualized lung apices show no acute findings.  IMPRESSION:  1.  Straightening of the normal cervical lordosis with spondylosis. No fracture or subluxation. 2.  Markedly enlarged left lobe of thyroid.   Original Report Authenticated By: Leanna Battles, M.D.     1. Fall, initial encounter   2. Head injury, initial encounter   3. Rib pain on left side     MDM  6:33 AM Imaging pending. Patient will have CT head, cervical spine, abdomen pelvis and chest xray.  7:54 AM Imaging unremarkable for any acute changes. Patient will be discharged back to the nursing facility. Patient has been cleaned up and is resting comfortably in her bed.   Emilia Beck, PA-C 08/26/12 [phone]

## 2012-09-01 IMAGING — CR DG CHEST 1V PORT
1 series · 2 of 2 positions shown · non-contrast
Comparison: none

REASON FOR EXAM: COMMENTS:

[Series 1: view not recorded · 0.17mm/px · 2 of 2 slices shown]
[im 1/2]
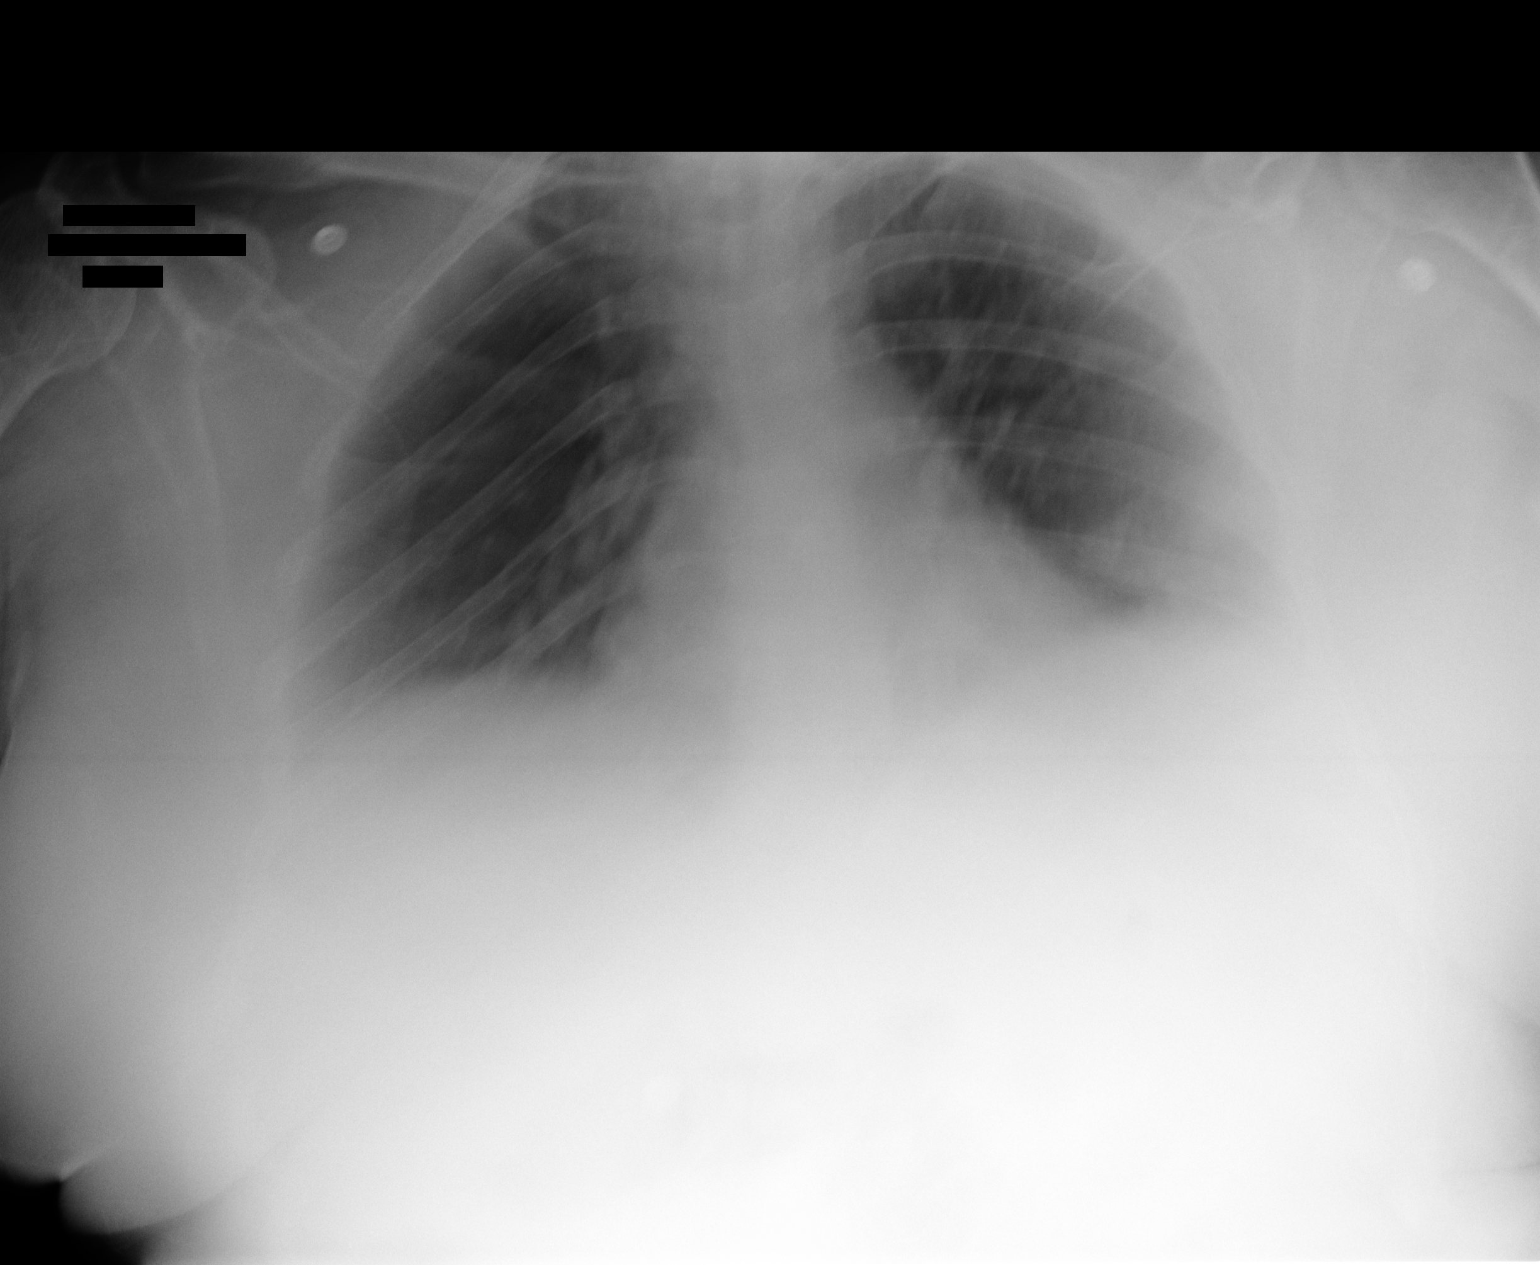
[im 2/2]
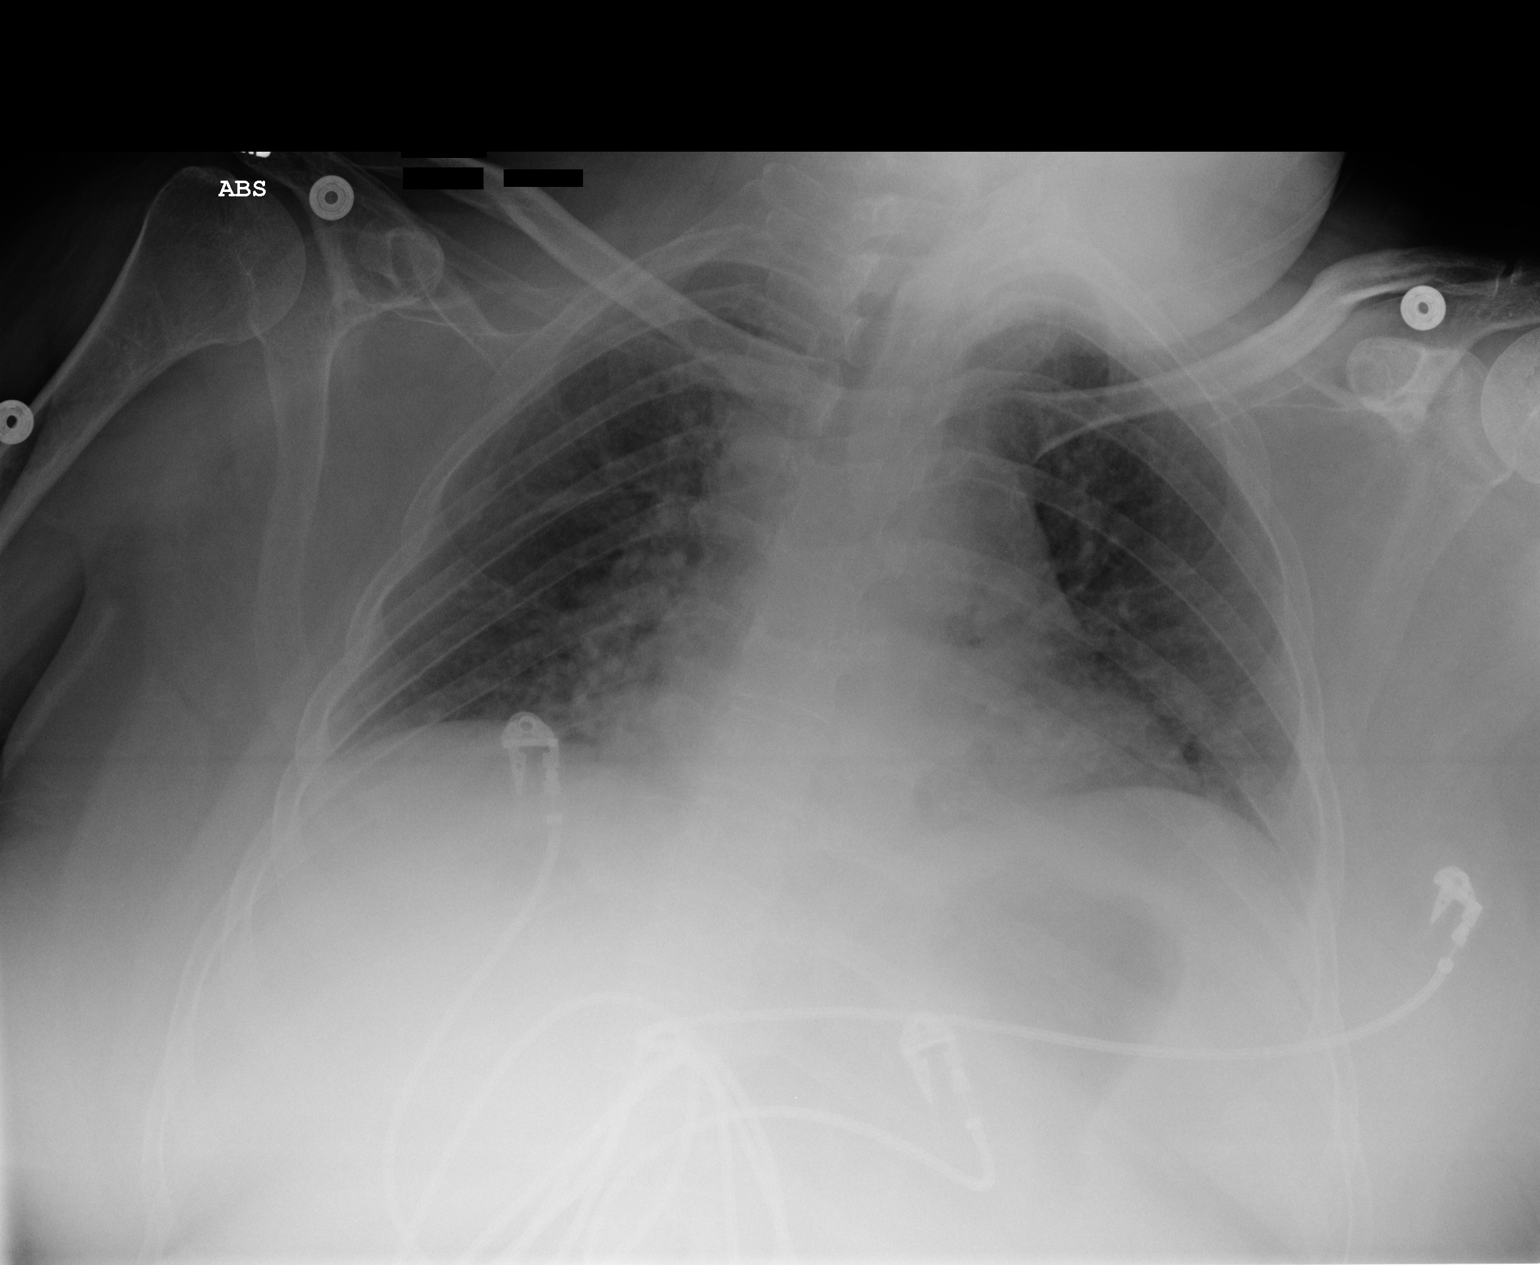

[2 of 2 positions shown; findings below may reference images not displayed]

PROCEDURE:     DXR - DXR PORTABLE CHEST SINGLE VIEW  - March 13, 2010 [DATE]

RESULT:      Comparison is made with study of 06/24/2009. An AP portable
projection is obtained. There is severe motion artifact. There is no
mediastinal shift or gross consolidation evident, however, the film is not
diagnostic and should be repeated.
IMPRESSION: No evidence of cardiomegaly or mediastinal shift. Other
investigation is not possible given the severe motion artifact. The image
should be repeated. An addendum can be performed at that time.

Addendum: The study was repeated. There is no motion artifact. There is very
shallow inspiration. The lung markings are prominent which could be
artifactual from underinflation. Mild edema or interstitial infiltrate
should be considered and correlated clinically. There is some basilar
atelectasis bilaterally. There is no significant effusion.

## 2012-09-03 IMAGING — CR DG CHEST 2V
1 series · 2 of 2 positions shown · non-contrast
Comparison: none

REASON FOR EXAM: hypoxia/asp pneumonia
COMMENTS:

[Series 1: view not recorded · 0.17mm/px · 2 of 2 slices shown]
[im 1/2]
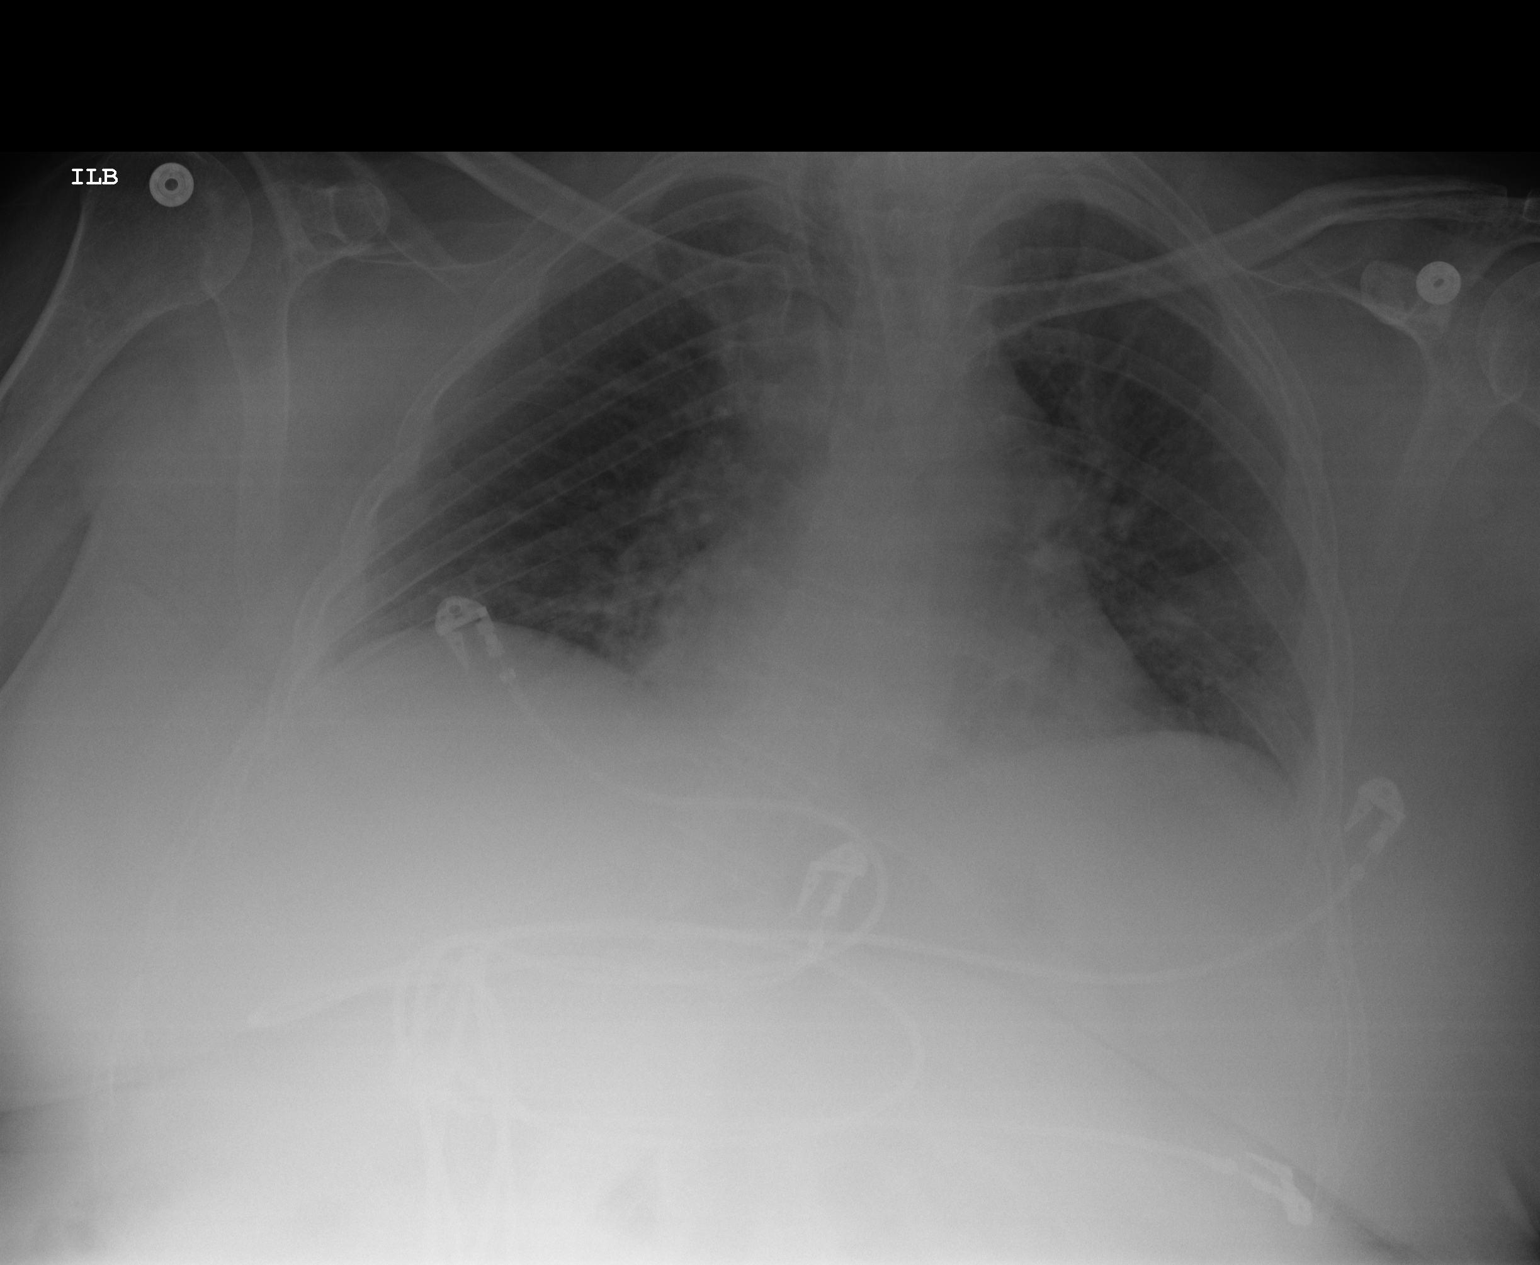
[im 2/2]
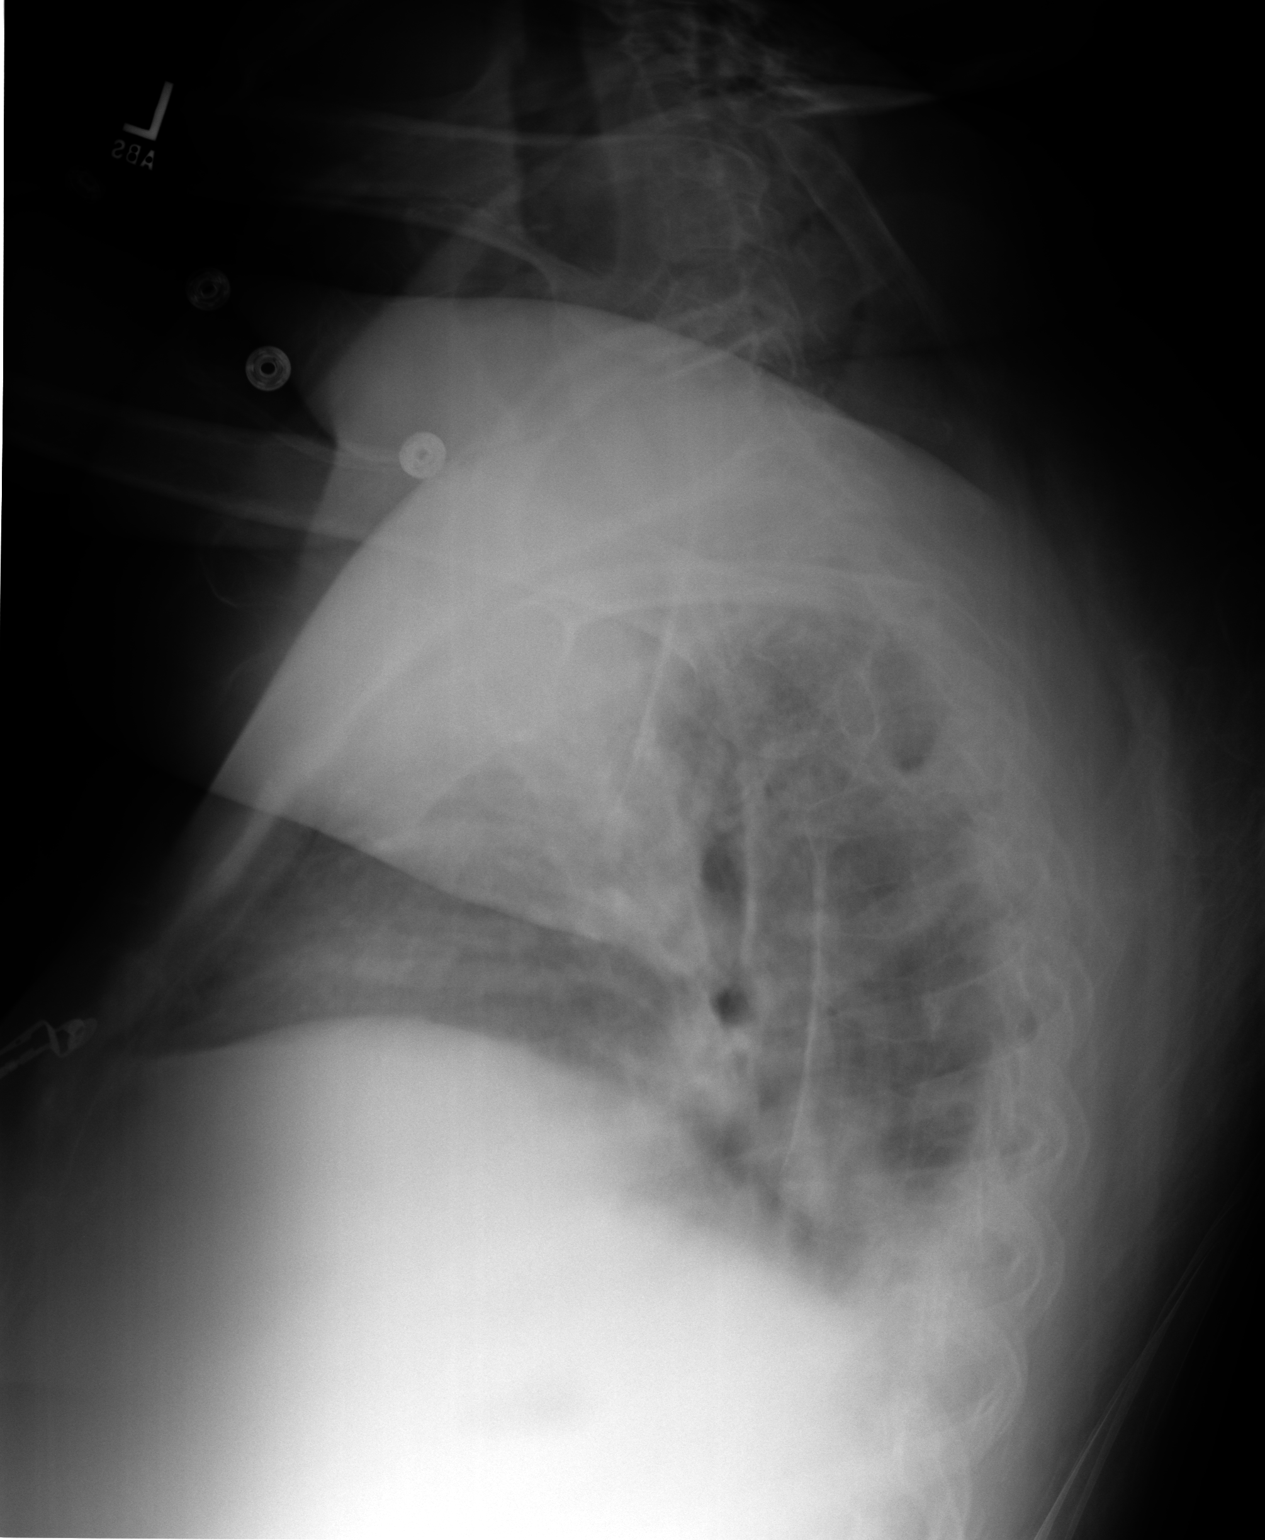

[2 of 2 positions shown; findings below may reference images not displayed]

PROCEDURE:     DXR - DXR CHEST PA (OR AP) AND LATERAL  - March 15, 2010  [DATE]

RESULT:     Comparison is made to the study of 03/13/2010. Respiratory motion
artifact is present. The patient is rotated toward the right. There appears
to be a mild scoliotic curvature concave to the right. Perihilar prominence
of the lung markings is noted. Bilateral perihilar edema or infiltrate is
not excluded. There is no effusion. There is no pneumothorax. The heart is
borderline enlarged.
IMPRESSION: Perihilar infiltrate versus mild edema greater on the left
than the right.

## 2013-11-19 ENCOUNTER — Emergency Department (HOSPITAL_COMMUNITY): Payer: Medicare Other

## 2013-11-19 ENCOUNTER — Inpatient Hospital Stay (HOSPITAL_COMMUNITY)
Admission: EM | Admit: 2013-11-19 | Discharge: 2013-11-23 | DRG: 871 | Disposition: A | Discharge status: Skilled Nursing Facility | Payer: Medicare Other | Attending: Internal Medicine | Admitting: Internal Medicine

## 2013-11-19 ENCOUNTER — Encounter (HOSPITAL_COMMUNITY): Payer: Self-pay | Admitting: Emergency Medicine

## 2013-11-19 DIAGNOSIS — R0602 Shortness of breath: Secondary | ICD-10-CM

## 2013-11-19 DIAGNOSIS — G40909 Epilepsy, unspecified, not intractable, without status epilepticus: Secondary | ICD-10-CM | POA: Diagnosis present

## 2013-11-19 DIAGNOSIS — Z1612 Extended spectrum beta lactamase (ESBL) resistance: Secondary | ICD-10-CM

## 2013-11-19 DIAGNOSIS — J9600 Acute respiratory failure, unspecified whether with hypoxia or hypercapnia: Secondary | ICD-10-CM | POA: Diagnosis present

## 2013-11-19 DIAGNOSIS — F3289 Other specified depressive episodes: Secondary | ICD-10-CM | POA: Diagnosis present

## 2013-11-19 DIAGNOSIS — R1310 Dysphagia, unspecified: Secondary | ICD-10-CM | POA: Diagnosis present

## 2013-11-19 DIAGNOSIS — Z86718 Personal history of other venous thrombosis and embolism: Secondary | ICD-10-CM

## 2013-11-19 DIAGNOSIS — A419 Sepsis, unspecified organism: Principal | ICD-10-CM | POA: Diagnosis present

## 2013-11-19 DIAGNOSIS — F411 Generalized anxiety disorder: Secondary | ICD-10-CM | POA: Diagnosis present

## 2013-11-19 DIAGNOSIS — N3000 Acute cystitis without hematuria: Secondary | ICD-10-CM

## 2013-11-19 DIAGNOSIS — Z6837 Body mass index (BMI) 37.0-37.9, adult: Secondary | ICD-10-CM

## 2013-11-19 DIAGNOSIS — J690 Pneumonitis due to inhalation of food and vomit: Secondary | ICD-10-CM | POA: Diagnosis present

## 2013-11-19 DIAGNOSIS — Z79899 Other long term (current) drug therapy: Secondary | ICD-10-CM

## 2013-11-19 DIAGNOSIS — Z66 Do not resuscitate: Secondary | ICD-10-CM | POA: Diagnosis present

## 2013-11-19 DIAGNOSIS — N390 Urinary tract infection, site not specified: Secondary | ICD-10-CM | POA: Diagnosis present

## 2013-11-19 DIAGNOSIS — R569 Unspecified convulsions: Secondary | ICD-10-CM | POA: Diagnosis present

## 2013-11-19 DIAGNOSIS — E039 Hypothyroidism, unspecified: Secondary | ICD-10-CM | POA: Diagnosis present

## 2013-11-19 DIAGNOSIS — E041 Nontoxic single thyroid nodule: Secondary | ICD-10-CM

## 2013-11-19 DIAGNOSIS — J449 Chronic obstructive pulmonary disease, unspecified: Secondary | ICD-10-CM | POA: Insufficient documentation

## 2013-11-19 DIAGNOSIS — R0609 Other forms of dyspnea: Secondary | ICD-10-CM | POA: Diagnosis not present

## 2013-11-19 DIAGNOSIS — I10 Essential (primary) hypertension: Secondary | ICD-10-CM | POA: Diagnosis present

## 2013-11-19 DIAGNOSIS — R532 Functional quadriplegia: Secondary | ICD-10-CM | POA: Diagnosis present

## 2013-11-19 DIAGNOSIS — Z7401 Bed confinement status: Secondary | ICD-10-CM

## 2013-11-19 DIAGNOSIS — J9601 Acute respiratory failure with hypoxia: Secondary | ICD-10-CM | POA: Diagnosis present

## 2013-11-19 DIAGNOSIS — G894 Chronic pain syndrome: Secondary | ICD-10-CM | POA: Diagnosis present

## 2013-11-19 DIAGNOSIS — F70 Mild intellectual disabilities: Secondary | ICD-10-CM | POA: Diagnosis present

## 2013-11-19 DIAGNOSIS — Z23 Encounter for immunization: Secondary | ICD-10-CM

## 2013-11-19 DIAGNOSIS — N200 Calculus of kidney: Secondary | ICD-10-CM

## 2013-11-19 DIAGNOSIS — J189 Pneumonia, unspecified organism: Secondary | ICD-10-CM | POA: Diagnosis present

## 2013-11-19 DIAGNOSIS — G809 Cerebral palsy, unspecified: Secondary | ICD-10-CM | POA: Diagnosis present

## 2013-11-19 DIAGNOSIS — A498 Other bacterial infections of unspecified site: Secondary | ICD-10-CM

## 2013-11-19 DIAGNOSIS — E785 Hyperlipidemia, unspecified: Secondary | ICD-10-CM | POA: Diagnosis present

## 2013-11-19 DIAGNOSIS — F329 Major depressive disorder, single episode, unspecified: Secondary | ICD-10-CM | POA: Diagnosis present

## 2013-11-19 LAB — CBC WITH DIFFERENTIAL/PLATELET
BASOS PCT: 0 % (ref 0–1)
Basophils Absolute: 0 10*3/uL (ref 0.0–0.1)
Eosinophils Absolute: 0.1 10*3/uL (ref 0.0–0.7)
Eosinophils Relative: 4 % (ref 0–5)
HEMATOCRIT: 44.9 % (ref 36.0–46.0)
HEMOGLOBIN: 14.1 g/dL (ref 12.0–15.0)
LYMPHS ABS: 1.1 10*3/uL (ref 0.7–4.0)
Lymphocytes Relative: 31 % (ref 12–46)
MCH: 30.4 pg (ref 26.0–34.0)
MCHC: 31.4 g/dL (ref 30.0–36.0)
MCV: 96.8 fL (ref 78.0–100.0)
MONO ABS: 0.5 10*3/uL (ref 0.1–1.0)
MONOS PCT: 16 % — AB (ref 3–12)
NEUTROS ABS: 1.7 10*3/uL (ref 1.7–7.7)
NEUTROS PCT: 49 % (ref 43–77)
Platelets: 178 10*3/uL (ref 150–400)
RBC: 4.64 MIL/uL (ref 3.87–5.11)
RDW: 15.4 % (ref 11.5–15.5)
WBC: 3.4 10*3/uL — ABNORMAL LOW (ref 4.0–10.5)

## 2013-11-19 LAB — COMPREHENSIVE METABOLIC PANEL
ALBUMIN: 3.3 g/dL — AB (ref 3.5–5.2)
ALK PHOS: 48 U/L (ref 39–117)
ALT: 21 U/L (ref 0–35)
ANION GAP: 10 (ref 5–15)
AST: 20 U/L (ref 0–37)
BILIRUBIN TOTAL: 0.3 mg/dL (ref 0.3–1.2)
BUN: 14 mg/dL (ref 6–23)
CHLORIDE: 106 meq/L (ref 96–112)
CO2: 30 meq/L (ref 19–32)
CREATININE: 0.47 mg/dL — AB (ref 0.50–1.10)
Calcium: 8.6 mg/dL (ref 8.4–10.5)
GFR calc Af Amer: >90 mL/min (ref >90)
Glucose, Bld: 111 mg/dL — ABNORMAL HIGH (ref 70–99)
POTASSIUM: 3.6 meq/L — AB (ref 3.7–5.3)
Sodium: 146 mEq/L (ref 137–147)
Total Protein: 6.2 g/dL (ref 6.0–8.3)

## 2013-11-19 LAB — URINE MICROSCOPIC-ADD ON

## 2013-11-19 LAB — BLOOD GAS, ARTERIAL
Acid-Base Excess: 4.8 mmol/L — ABNORMAL HIGH (ref 0.0–2.0)
BICARBONATE: 31.6 meq/L — AB (ref 20.0–24.0)
Drawn by: 232811
O2 Content: 10 L/min
O2 SAT: 98.5 %
PATIENT TEMPERATURE: 98.9
PO2 ART: 132 mmHg — AB (ref 80.0–100.0)
TCO2: 27.9 mmol/L (ref 0–100)
pCO2 arterial: 58.5 mmHg (ref 35.0–45.0)
pH, Arterial: 7.353 (ref 7.350–7.450)

## 2013-11-19 LAB — URINALYSIS, ROUTINE W REFLEX MICROSCOPIC
Bilirubin Urine: NEGATIVE
GLUCOSE, UA: NEGATIVE mg/dL
Ketones, ur: NEGATIVE mg/dL
Nitrite: NEGATIVE
PH: 6 (ref 5.0–8.0)
Protein, ur: NEGATIVE mg/dL
SPECIFIC GRAVITY, URINE: 1.016 (ref 1.005–1.030)
Urobilinogen, UA: 0.2 mg/dL (ref 0.0–1.0)

## 2013-11-19 LAB — I-STAT CG4 LACTIC ACID, ED: Lactic Acid, Venous: 0.74 mmol/L (ref 0.5–2.2)

## 2013-11-19 MED ORDER — VANCOMYCIN HCL IN DEXTROSE 1-5 GM/200ML-% IV SOLN
1000.0000 mg | Freq: Three times a day (TID) | INTRAVENOUS | Status: DC
  Administered 2013-11-20 – 2013-11-21 (×4): 1000 mg via INTRAVENOUS
  Filled 2013-11-19 (×4): qty 200

## 2013-11-19 MED ORDER — VANCOMYCIN HCL IN DEXTROSE 1-5 GM/200ML-% IV SOLN
1000.0000 mg | Freq: Once | INTRAVENOUS | Status: AC
  Administered 2013-11-19: 1000 mg via INTRAVENOUS
  Filled 2013-11-19: qty 200

## 2013-11-19 MED ORDER — CEFEPIME HCL 2 G IJ SOLR
2.0000 g | Freq: Once | INTRAMUSCULAR | Status: AC
  Administered 2013-11-19: 2 g via INTRAVENOUS
  Filled 2013-11-19: qty 2

## 2013-11-19 MED ORDER — SODIUM CHLORIDE 0.9 % IV BOLUS (SEPSIS)
1000.0000 mL | Freq: Once | INTRAVENOUS | Status: AC
  Administered 2013-11-19: 1000 mL via INTRAVENOUS

## 2013-11-19 MED ORDER — DEXTROSE 5 % IV SOLN
1.0000 g | Freq: Three times a day (TID) | INTRAVENOUS | Status: DC
  Administered 2013-11-20 – 2013-11-23 (×10): 1 g via INTRAVENOUS
  Filled 2013-11-19 (×11): qty 1

## 2013-11-19 MED ORDER — SODIUM CHLORIDE 0.9 % IV SOLN
INTRAVENOUS | Status: AC
  Administered 2013-11-20: 75 mL/h via INTRAVENOUS

## 2013-11-19 NOTE — H&P (Signed)
PCP:   Karlene Einstein, MD   Chief Complaint:  pna  HPI: 48 yo female with h/o cerebral palsy, bedbound, sent in from snf for unable to get iv access.  cxr was done yesterday for concern of pna and today snf was trying to start her on iv abx.  On arrival to ED she was very tachypneic and breathing described as agonal by edp.  Nasal trumpet was placed, which improved her breathing pattern and responsiveness.  Her baseline is poor.  She was hypoxic on arrival low 80s on RA, she is now on nrb.  Improved oxygen sats and becoming more responsive.  She has DNR paperwork with her.  Review of Systems:  Unable to obtain from pt  Past Medical History: Past Medical History  Diagnosis Date  . Sepsis(995.91)   . Hypertension   . DVT (deep vein thrombosis) in pregnancy   . Depression   . Chronic pain syndrome   . Allergic rhinitis   . Acute respiratory failure   . Epilepsy   . Migraine   . Chronic airway obstruction   . Cerebral palsy   . Dysphagia   . Hiatal hernia   . Morbid obesity 09-25-11    requires Hoyer lift. pt. doesn't stand or ambulate.  . Incontinent of urine 09-25-11    hx.  . Incontinent of feces 09-25-11    hx.  . Vitamin D deficiency   . Sepsis(995.91)     hx of   . Hyperlipidemia   . Mild intellectual disabilities   . Neurogenic bladder   . Hypothyroidism   . Anxiety   . Unspecified psychosis   . Muscle weakness   . Dysphasia   . Pneumonia     hx of asp pneumonia 1/11-1/19/12  . Sacral decubitus ulcer     hx of  . Nephrolithiasis 04/05/2012   Past Surgical History  Procedure Laterality Date  . Nephrolithotomy  10/07/2011    Procedure: NEPHROLITHOTOMY PERCUTANEOUS;  Surgeon: Marcine Matar, MD;  Location: [facility];  Service: Urology;  Laterality: Right;  kidney   . Cystoscopy with ureteroscopy  11/07/2011    Procedure: CYSTOSCOPY WITH URETEROSCOPY;  Surgeon: Marcine Matar, MD;  Location: [facility];  Service: Urology;  Laterality: Right;  Removal of right double  J stent, Insertion right double J stent  . Stone extraction with basket  11/07/2011    Procedure: STONE EXTRACTION WITH BASKET;  Surgeon: Marcine Matar, MD;  Location: [facility];  Service: Urology;  Laterality: Right;  . Nephrolithotomy  12/06/2011    Procedure: NEPHROLITHOTOMY PERCUTANEOUS;  Surgeon: Marcine Matar, MD;  Location: [facility];  Service: Urology;  Laterality: Right;   REPEAT PCNL     Medications: Prior to Admission medications   Medication Sig Start Date End Date Taking? Authorizing Provider  arformoterol (BROVANA) 15 MCG/2ML NEBU Take 15 mcg by nebulization 2 (two) times daily.   Yes Historical Provider, MD  baclofen (LIORESAL) 10 MG tablet Take 10 mg by mouth 4 (four) times daily.   Yes Historical Provider, MD  budesonide (PULMICORT) 0.25 MG/2ML nebulizer solution Take 0.25 mg by nebulization 2 (two) times daily.   Yes Historical Provider, MD  clonazePAM (KLONOPIN) 1 MG tablet Take 1 mg by mouth 2 (two) times daily.   Yes Historical Provider, MD  divalproex (DEPAKOTE) 250 MG DR tablet Take 750 mg by mouth at bedtime.   Yes Historical Provider, MD  fentaNYL (DURAGESIC - DOSED MCG/HR) 75 MCG/HR Place 75 mcg onto the skin every 3 (three) days.  Yes Historical Provider, MD  HYDROcodone-acetaminophen (NORCO/VICODIN) 5-325 MG per tablet Take 1 tablet by mouth every 6 (six) hours as needed for moderate pain.   Yes Historical Provider, MD  ketotifen (ZADITOR) 0.025 % ophthalmic solution Place 1 drop into both eyes 2 (two) times daily.   Yes Historical Provider, MD  ketotifen (ZADITOR) 0.025 % ophthalmic solution Place 1 drop into both eyes 2 (two) times daily.   Yes Historical Provider, MD  lamoTRIgine (LAMICTAL) 100 MG tablet Take 100 mg by mouth 2 (two) times daily.    Yes Historical Provider, MD  levETIRAcetam (KEPPRA) 250 MG tablet Take 250 mg by mouth every 12 (twelve) hours.   Yes Historical Provider, MD  levothyroxine (SYNTHROID, LEVOTHROID) 25 MCG tablet Take 25 mcg by mouth  daily before breakfast.    Yes Historical Provider, MD  levothyroxine (SYNTHROID, LEVOTHROID) 25 MCG tablet Take 25 mcg by mouth daily before breakfast.   Yes Historical Provider, MD  Melatonin-Pyridoxine (MELATIN PO) Take 1 tablet by mouth at bedtime.   Yes Historical Provider, MD  montelukast (SINGULAIR) 10 MG tablet Take 10 mg by mouth every morning.    Yes Historical Provider, MD  potassium chloride (K-DUR) 10 MEQ tablet Take 10 mEq by mouth daily.   Yes Historical Provider, MD  pravastatin (PRAVACHOL) 40 MG tablet Take 40 mg by mouth daily.    Yes Historical Provider, MD  pravastatin (PRAVACHOL) 40 MG tablet Take 40 mg by mouth daily.   Yes Historical Provider, MD  risperiDONE (RISPERDAL) 2 MG tablet Take 2 mg by mouth at bedtime.   Yes Historical Provider, MD  risperiDONE microspheres (RISPERDAL CONSTA) 50 MG injection Inject 50 mg into the muscle every 14 (fourteen) days.   Yes Historical Provider, MD  senna (SENOKOT) 8.6 MG TABS tablet Take 2 tablets by mouth daily.   Yes Historical Provider, MD  venlafaxine XR (EFFEXOR-XR) 150 MG 24 hr capsule Take 150 mg by mouth daily with breakfast.   Yes Historical Provider, MD  vitamin C (ASCORBIC ACID) 500 MG tablet Take 500 mg by mouth 2 (two) times daily.   Yes Historical Provider, MD  Vitamin D, Ergocalciferol, (DRISDOL) 50000 UNITS CAPS capsule Take 50,000 Units by mouth every 7 (seven) days.   Yes Historical Provider, MD  albuterol (PROVENTIL HFA;VENTOLIN HFA) 108 (90 BASE) MCG/ACT inhaler Inhale 2 puffs into the lungs every 4 (four) hours as needed for wheezing or shortness of breath. 04/07/12   Renae Fickle, MD  levalbuterol Pauline Aus) 0.63 MG/3ML nebulizer solution Take 3 mLs (0.63 mg total) by nebulization every 8 (eight) hours as needed for wheezing. 03/02/12   Tora Kindred York, PA-C  LORazepam (ATIVAN) 2 MG/ML concentrated solution Take 1 mg by mouth every 8 (eight) hours as needed for anxiety.     Historical Provider, MD  sodium chloride  (OCEAN) 0.65 % nasal spray Place 1 spray into the nose 2 (two) times daily.    Historical Provider, MD    Allergies:   Allergies  Allergen Reactions  . Sulfa Antibiotics Hives  . Tape Rash    Paper tape    Social History:  reports that she has never smoked. She has never used smokeless tobacco. She reports that she does not drink alcohol or use illicit drugs.  Family History: History reviewed. No pertinent family history.  Physical Exam: Filed Vitals:   11/19/13 2145 11/19/13 2200 11/19/13 2215 11/19/13 2230  BP: 141/68 148/55 139/69 136/92  Pulse: 108 110 108 115  Temp:  TempSrc:      Resp: Height:      Weight:      SpO2: 95% 92% 96% 95%   General appearance: moderate distress, slowed mentation and toxic Head: Normocephalic, without obvious abnormality, atraumatic Eyes: negative Nose: Nares normal. Septum midline. Mucosa normal. No drainage or sinus tenderness. Neck: no JVD and supple, symmetrical, trachea midline Lungs: diminished breath sounds bilaterally Heart: regular rate and rhythm, S1, S2 normal, no murmur, click, rub or gallop Abdomen: soft, non-tender; bowel sounds normal; no masses,  no organomegaly Extremities: extremities normal, atraumatic, no cyanosis or edema Pulses: 2+ and symmetric Skin: Skin color, texture, turgor normal. No rashes or lesions Neurologic: Mental status: alertness: lethargic Cranial nerves: normal  Labs on Admission:   Recent Labs  11/19/13 2058  NA 146  K 3.6*  CL 106  CO2 30  GLUCOSE 111*  BUN 14  CREATININE 0.47*  CALCIUM 8.6    Recent Labs  11/19/13 2058  AST 20  ALT 21  ALKPHOS 48  BILITOT 0.3  PROT 6.2  ALBUMIN 3.3*    Recent Labs  11/19/13 1945  WBC 3.4*  NEUTROABS 1.7  HGB 14.1  HCT 44.9  MCV 96.8  PLT 178    Radiological Exams on Admission: Dg Chest Port 1 View  11/19/2013   CLINICAL DATA:  Fever  EXAM: PORTABLE CHEST - 1 VIEW  COMPARISON:  Radiographs 08/26/2012  FINDINGS:  Normal cardiac silhouette. There is low lung volumes. There is patchy bibasilar airspace disease. No pneumothorax.  IMPRESSION: Mild patchy bibasilar airspace disease could represent pneumonia, edema, or aspiration pneumonitis.   Electronically Signed   By: Genevive Bi M.D.   On: 11/19/2013 20:04    Assessment/Plan  48 yo female with sepsis from HCAP/aspiration pna  Principal Problem:   HCAP (healthcare-associated pneumonia)-  pna pathway.  Place in stepdown due to oxygen requirements, she is dnr.  Suspect obstructive component on arrival since nasal trumpet helped.  Vanc/cefepime.  Ivf.    Active Problems:   Cerebral palsy   Morbid obesity   Acute respiratory failure with hypoxia  DNR.  Admit to stepdown.  Jandel Patriarca A 11/19/2013, 10:54 PM

## 2013-11-19 NOTE — ED Notes (Signed)
Found 2 pcs. Of fentanyl patches on pt.'s right shoulder, removed per MD's order.Wasted in sharps, witnessed by [employer]

## 2013-11-19 NOTE — Progress Notes (Signed)
ANTIBIOTIC CONSULT NOTE - INITIAL  Pharmacy Consult for Vancomycin, Cefepime Indication: pneumonia  Allergies  Allergen Reactions  . Sulfa Antibiotics Hives  . Tape Rash    Paper tape    Patient Measurements: Height: 5' (152.4 cm) Weight: 206 lb (93.441 kg) IBW/kg (Calculated) : 45.5  Vital Signs: Temp: 98.9 F (37.2 C) (09/18 1919) Temp src: Rectal (09/18 1919) BP: 149/76 mmHg (09/18 1918) Pulse Rate: 102 (09/18 1918) Intake/Output from previous day:    Labs:  Recent Labs  11/19/13 1945 11/19/13 2058  WBC 3.4*  --   HGB 14.1  --   PLT 178  --   CREATININE  --  0.47*   Estimated Creatinine Clearance: 88.8 ml/min (by C-G formula based on Cr of 0.47). No results found for this basename: VANCOTROUGH, VANCOPEAK, VANCORANDOM, GENTTROUGH, GENTPEAK, GENTRANDOM, TOBRATROUGH, TOBRAPEAK, TOBRARND, AMIKACINPEAK, AMIKACINTROU, AMIKACIN,  in the last 72 hours   Microbiology: No results found for this or any previous visit (from the past 720 hour(s)).  Medical History: Past Medical History  Diagnosis Date  . Sepsis(995.91)   . Hypertension   . DVT (deep vein thrombosis) in pregnancy   . Depression   . Chronic pain syndrome   . Allergic rhinitis   . Acute respiratory failure   . Epilepsy   . Migraine   . Chronic airway obstruction   . Cerebral palsy   . Dysphagia   . Hiatal hernia   . Morbid obesity 09-25-11    requires Hoyer lift. pt. doesn't stand or ambulate.  . Incontinent of urine 09-25-11    hx.  . Incontinent of feces 09-25-11    hx.  . Vitamin D deficiency   . Sepsis(995.91)     hx of   . Hyperlipidemia   . Mild intellectual disabilities   . Neurogenic bladder   . Hypothyroidism   . Anxiety   . Unspecified psychosis   . Muscle weakness   . Dysphasia   . Pneumonia     hx of asp pneumonia 1/11-1/19/12  . Sacral decubitus ulcer     hx of  . Nephrolithiasis 04/05/2012    Medications:  Anti-infectives   Start     Dose/Rate Route Frequency Ordered  Stop   11/19/13 1945  ceFEPIme (MAXIPIME) 2 g in dextrose 5 % 50 mL IVPB     2 g 100 mL/hr over 30 Minutes Intravenous  Once 11/19/13 1935     11/19/13 1945  vancomycin (VANCOCIN) IVPB 1000 mg/200 mL premix     1,000 mg 200 mL/hr over 60 Minutes Intravenous  Once 11/19/13 1935       Assessment: 60 yoF admitted 9/18 with aspiration pneumonia, HCAP.  Pharmacy consulted to dose Cefepime and Vancomycin.  9/18 >> Vanc >> 9/18 >> Cefepime >>     Tmax: 98.9  WBCs: 3.4  Renal: SCr 0.47, CrCl ~ 88 ml/min  Blood and urine cultures pending   Goal of Therapy:  Vancomycin trough level 15-20 mcg/ml Appropriate abx dosing, eradication of infection.  Plan:   Cefepime 1g IV q8h.  Vancomycin 1g IV q8h.  Measure Vanc trough at steady state.  Follow up renal fxn and culture results.   Lynann Beaver PharmD, BCPS Pager [phone] 11/19/2013 10:01 PM

## 2013-11-19 NOTE — ED Notes (Signed)
Received  A call from Nurse Lackart of [facility] , reported that pt.  has unclear speech but used to be responsive . today pt. Has been lethargic .  Reported to receive Klonopin  PO this 9am.  Md notified.

## 2013-11-19 NOTE — ED Notes (Signed)
Second attempt at in and out rn notified no urine return

## 2013-11-19 NOTE — ED Notes (Signed)
Per EMS pt comes from [facility] for fever and the facility was unable to obtain IV access to administer antibiotics for her known aspiration PNA according to chest xray done on 11/18/2013.  EMS vital signsBP 149/56, 98% O2 on NRB,  Pt O2 on room air was 83%; CBG 161.  Pt has Fentanyl patch on right chest.

## 2013-11-19 NOTE — ED Provider Notes (Signed)
CSN: [id number]     Arrival date & time 11/19/13  1913 History   First MD Initiated Contact with Patient 11/19/13 1918     Chief Complaint  Patient presents with  . lethargic   . Fever     (Consider location/radiation/quality/duration/timing/severity/associated sxs/prior Treatment) HPI Comments: 48 year old female with a history of multiple sclerosis presenting from her nursing facility secondary to aspiration pneumonia and altered mental status.  Per nursing report, she was diagnosed with pneumonia on chest x-ray performed yesterday. They were unable to obtain IV access, so I sent her to the emergency department. She was found to be hypoxic by EMS and placed on a nonrebreather. She is unable to provide any history secondary to altered mental status.  Level V Caveat applies.   Past Medical History  Diagnosis Date  . Sepsis(995.91)   . Hypertension   . DVT (deep vein thrombosis) in pregnancy   . Depression   . Chronic pain syndrome   . Allergic rhinitis   . Acute respiratory failure   . Epilepsy   . Migraine   . Chronic airway obstruction   . Cerebral palsy   . Dysphagia   . Hiatal hernia   . Morbid obesity 09-25-11    requires Hoyer lift. pt. doesn't stand or ambulate.  . Incontinent of urine 09-25-11    hx.  . Incontinent of feces 09-25-11    hx.  . Vitamin D deficiency   . Sepsis(995.91)     hx of   . Hyperlipidemia   . Mild intellectual disabilities   . Neurogenic bladder   . Hypothyroidism   . Anxiety   . Unspecified psychosis   . Muscle weakness   . Dysphasia   . Pneumonia     hx of asp pneumonia 1/11-1/19/12  . Sacral decubitus ulcer     hx of  . Nephrolithiasis 04/05/2012   Past Surgical History  Procedure Laterality Date  . Nephrolithotomy  10/07/2011    Procedure: NEPHROLITHOTOMY PERCUTANEOUS;  Surgeon: Marcine Matar, MD;  Location: [facility];  Service: Urology;  Laterality: Right;  kidney   . Cystoscopy with ureteroscopy  11/07/2011    Procedure: CYSTOSCOPY  WITH URETEROSCOPY;  Surgeon: Marcine Matar, MD;  Location: [facility];  Service: Urology;  Laterality: Right;  Removal of right double J stent, Insertion right double J stent  . Stone extraction with basket  11/07/2011    Procedure: STONE EXTRACTION WITH BASKET;  Surgeon: Marcine Matar, MD;  Location: [facility];  Service: Urology;  Laterality: Right;  . Nephrolithotomy  12/06/2011    Procedure: NEPHROLITHOTOMY PERCUTANEOUS;  Surgeon: Marcine Matar, MD;  Location: [facility];  Service: Urology;  Laterality: Right;   REPEAT PCNL    No family history on file. History  Substance Use Topics  . Smoking status: Never Smoker   . Smokeless tobacco: Never Used  . Alcohol Use: No   OB History   Grav Para Term Preterm Abortions TAB SAB Ect Mult Living                 Review of Systems  Unable to perform ROS: Mental status change      Allergies  Sulfa antibiotics and Tape  Home Medications   Prior to Admission medications   Medication Sig Start Date End Date Taking? Authorizing Provider  albuterol (PROVENTIL HFA;VENTOLIN HFA) 108 (90 BASE) MCG/ACT inhaler Inhale 2 puffs into the lungs every 4 (four) hours as needed for wheezing or shortness of breath. 04/07/12   Renae Fickle,  MD  arformoterol (BROVANA) 15 MCG/2ML NEBU Take 15 mcg by nebulization 2 (two) times daily.    Historical Provider, MD  ARIPiprazole (ABILIFY) 5 MG tablet Take 5 mg by mouth 2 (two) times daily.    Historical Provider, MD  aspirin EC 81 MG tablet Take 81 mg by mouth daily.    Historical Provider, MD  baclofen (LIORESAL) 10 MG tablet Take 10 mg by mouth 4 (four) times daily.     Historical Provider, MD  budesonide (PULMICORT) 0.25 MG/2ML nebulizer solution Take 0.25 mg by nebulization 2 (two) times daily.    Historical Provider, MD  fentaNYL (DURAGESIC - DOSED MCG/HR) 25 MCG/HR Place 1 patch onto the skin every 3 (three) days.    Historical Provider, MD  fexofenadine-pseudoephedrine (ALLEGRA-D 24) 180-240 MG per 24 hr  tablet Take 1 tablet by mouth daily.    Historical Provider, MD  HYDROcodone-acetaminophen (NORCO) 10-325 MG per tablet Take 1 tablet by mouth at bedtime as needed. For pain 04/07/12   Renae Fickle, MD  ketotifen (ZADITOR) 0.025 % ophthalmic solution Place 1 drop into both eyes 2 (two) times daily.    Historical Provider, MD  lamoTRIgine (LAMICTAL) 100 MG tablet Take 100 mg by mouth 2 (two) times daily.     Historical Provider, MD  levalbuterol Pauline Aus) 0.63 MG/3ML nebulizer solution Take 3 mLs (0.63 mg total) by nebulization every 8 (eight) hours as needed for wheezing. 03/02/12   Tora Kindred York, PA-C  levETIRAcetam (KEPPRA) 250 MG tablet Take 250 mg by mouth every 12 (twelve) hours.    Historical Provider, MD  levothyroxine (SYNTHROID, LEVOTHROID) 25 MCG tablet Take 25 mcg by mouth daily before breakfast.     Historical Provider, MD  LORazepam (ATIVAN) 2 MG/ML concentrated solution Take 1 mg by mouth every 8 (eight) hours as needed.    Historical Provider, MD  montelukast (SINGULAIR) 10 MG tablet Take 10 mg by mouth every morning.     Historical Provider, MD  nystatin (MYCOSTATIN/NYSTOP) 100000 UNIT/GM POWD Apply topically 2 times per day to intertrigenous rashed areas 04/07/12   Renae Fickle, MD  potassium citrate (UROCIT-K) 10 MEQ (1080 MG) SR tablet Take 1 tablet (10 mEq total) by mouth 3 (three) times daily with meals. 04/07/12   Renae Fickle, MD  pravastatin (PRAVACHOL) 40 MG tablet Take 40 mg by mouth daily.     Historical Provider, MD  sodium chloride (OCEAN) 0.65 % nasal spray Place 1 spray into the nose 2 (two) times daily.    Historical Provider, MD   BP 149/76  Pulse 102  Temp(Src) 98.9 F (37.2 C) (Rectal)  Resp 21  SpO2 99% Physical Exam  Nursing note and vitals reviewed. Constitutional: She appears well-developed and well-nourished. She appears lethargic. She appears toxic.  HENT:  Head: Normocephalic and atraumatic.  Mouth/Throat: Oropharynx is clear and moist.  Eyes:  Conjunctivae are normal. Pupils are equal, round, and reactive to light. No scleral icterus.  Neck: Neck supple.  Cardiovascular: Normal rate, regular rhythm, normal heart sounds and intact distal pulses.   No murmur heard. Pulmonary/Chest: Accessory muscle usage present. No stridor. She is in respiratory distress (agonal respirations). She has decreased breath sounds in the left middle field and the left lower field. She has no rales.  Abdominal: Soft. Bowel sounds are normal. She exhibits no distension. There is no tenderness.  Musculoskeletal: Normal range of motion.  Neurological: She appears lethargic. She is disoriented. GCS eye subscore is 2. GCS verbal subscore is 2. GCS motor  subscore is 4.  Skin: Skin is warm and dry. No rash noted.  Psychiatric: She has a normal mood and affect. Her behavior is normal.    ED Course  CRITICAL CARE Performed by: Blake Divine DAVID III Authorized by: Blake Divine DAVID III Total critical care time: 40 minutes Critical care time was exclusive of separately billable procedures and treating other patients. Critical care was necessary to treat or prevent imminent or life-threatening deterioration of the following conditions: respiratory failure, CNS failure or compromise and sepsis. Critical care was time spent personally by me on the following activities: development of treatment plan with patient or surrogate, discussions with consultants, evaluation of patient's response to treatment, examination of patient, obtaining history from patient or surrogate, ordering and performing treatments and interventions, ordering and review of laboratory studies, ordering and review of radiographic studies, pulse oximetry, re-evaluation of patient's condition and review of old charts.   (including critical care time) Labs Review Labs Reviewed  CBC WITH DIFFERENTIAL - Abnormal; Notable for the following:    WBC 3.4 (*)    Monocytes Relative 16 (*)    All other  components within normal limits  URINALYSIS, ROUTINE W REFLEX MICROSCOPIC - Abnormal; Notable for the following:    APPearance CLOUDY (*)    Hgb urine dipstick TRACE (*)    Leukocytes, UA LARGE (*)    All other components within normal limits  BLOOD GAS, ARTERIAL - Abnormal; Notable for the following:    pCO2 arterial 58.5 (*)    pO2, Arterial 132.0 (*)    Bicarbonate 31.6 (*)    Acid-Base Excess 4.8 (*)    All other components within normal limits  COMPREHENSIVE METABOLIC PANEL - Abnormal; Notable for the following:    Potassium 3.6 (*)    Glucose, Bld 111 (*)    Creatinine, Ser 0.47 (*)    Albumin 3.3 (*)    All other components within normal limits  CULTURE, BLOOD (ROUTINE X 2)  CULTURE, BLOOD (ROUTINE X 2)  URINE CULTURE  URINE MICROSCOPIC-ADD ON  I-STAT CG4 LACTIC ACID, ED    Imaging Review Dg Chest Port 1 View  11/19/2013   CLINICAL DATA:  Fever  EXAM: PORTABLE CHEST - 1 VIEW  COMPARISON:  Radiographs 08/26/2012  FINDINGS: Normal cardiac silhouette. There is low lung volumes. There is patchy bibasilar airspace disease. No pneumothorax.  IMPRESSION: Mild patchy bibasilar airspace disease could represent pneumonia, edema, or aspiration pneumonitis.   Electronically Signed   By: Genevive Bi M.D.   On: 11/19/2013 20:04  All radiology studies independently viewed by me.      EKG Interpretation None      MDM   Final diagnoses:  Acute respiratory failure with hypoxia  Cerebral palsy  HCAP (healthcare-associated pneumonia)  Morbid obesity  Shortness of breath    48 yo female presenting with PNA diagnosed at nursing facility.  She has a DNR order with her paperwork.  Agonal breathing improved when I placed nasal airway.  Code sepsis, broad spectrum HCAP abx.  Fluid bolus.    WOB and mental status marginally improved initially, but deteriorated again.  Unable to tolerate  O2.  Consulted Internal Medicine (Dr. Onalee Hua) for admission.    Candyce Churn III,  MD 11/19/13 [phone]

## 2013-11-19 NOTE — ED Notes (Signed)
Attempted in and out cath returned no urine Rn and MD made aware. Will attempt after fluids is complet

## 2013-11-20 DIAGNOSIS — R532 Functional quadriplegia: Secondary | ICD-10-CM | POA: Diagnosis present

## 2013-11-20 DIAGNOSIS — Z7401 Bed confinement status: Secondary | ICD-10-CM | POA: Diagnosis not present

## 2013-11-20 DIAGNOSIS — Z79899 Other long term (current) drug therapy: Secondary | ICD-10-CM | POA: Diagnosis not present

## 2013-11-20 DIAGNOSIS — E039 Hypothyroidism, unspecified: Secondary | ICD-10-CM | POA: Diagnosis present

## 2013-11-20 DIAGNOSIS — F329 Major depressive disorder, single episode, unspecified: Secondary | ICD-10-CM | POA: Diagnosis present

## 2013-11-20 DIAGNOSIS — R1310 Dysphagia, unspecified: Secondary | ICD-10-CM | POA: Diagnosis present

## 2013-11-20 DIAGNOSIS — E785 Hyperlipidemia, unspecified: Secondary | ICD-10-CM | POA: Diagnosis present

## 2013-11-20 DIAGNOSIS — F3289 Other specified depressive episodes: Secondary | ICD-10-CM | POA: Diagnosis present

## 2013-11-20 DIAGNOSIS — I10 Essential (primary) hypertension: Secondary | ICD-10-CM | POA: Diagnosis present

## 2013-11-20 DIAGNOSIS — R0609 Other forms of dyspnea: Secondary | ICD-10-CM | POA: Diagnosis present

## 2013-11-20 DIAGNOSIS — J9600 Acute respiratory failure, unspecified whether with hypoxia or hypercapnia: Secondary | ICD-10-CM | POA: Diagnosis present

## 2013-11-20 DIAGNOSIS — G40909 Epilepsy, unspecified, not intractable, without status epilepticus: Secondary | ICD-10-CM | POA: Diagnosis present

## 2013-11-20 DIAGNOSIS — N390 Urinary tract infection, site not specified: Secondary | ICD-10-CM

## 2013-11-20 DIAGNOSIS — Z23 Encounter for immunization: Secondary | ICD-10-CM | POA: Diagnosis not present

## 2013-11-20 DIAGNOSIS — Z6837 Body mass index (BMI) 37.0-37.9, adult: Secondary | ICD-10-CM | POA: Diagnosis not present

## 2013-11-20 DIAGNOSIS — A419 Sepsis, unspecified organism: Secondary | ICD-10-CM | POA: Diagnosis present

## 2013-11-20 DIAGNOSIS — J690 Pneumonitis due to inhalation of food and vomit: Secondary | ICD-10-CM | POA: Diagnosis present

## 2013-11-20 DIAGNOSIS — Z86718 Personal history of other venous thrombosis and embolism: Secondary | ICD-10-CM | POA: Diagnosis not present

## 2013-11-20 DIAGNOSIS — F70 Mild intellectual disabilities: Secondary | ICD-10-CM | POA: Diagnosis present

## 2013-11-20 DIAGNOSIS — G809 Cerebral palsy, unspecified: Secondary | ICD-10-CM | POA: Diagnosis present

## 2013-11-20 DIAGNOSIS — F411 Generalized anxiety disorder: Secondary | ICD-10-CM | POA: Diagnosis present

## 2013-11-20 DIAGNOSIS — J189 Pneumonia, unspecified organism: Secondary | ICD-10-CM | POA: Diagnosis present

## 2013-11-20 DIAGNOSIS — Z66 Do not resuscitate: Secondary | ICD-10-CM | POA: Diagnosis present

## 2013-11-20 DIAGNOSIS — R569 Unspecified convulsions: Secondary | ICD-10-CM | POA: Diagnosis present

## 2013-11-20 DIAGNOSIS — G894 Chronic pain syndrome: Secondary | ICD-10-CM | POA: Diagnosis present

## 2013-11-20 LAB — BASIC METABOLIC PANEL
Anion gap: 9 (ref 5–15)
BUN: 11 mg/dL (ref 6–23)
CO2: 32 mEq/L (ref 19–32)
Calcium: 8.7 mg/dL (ref 8.4–10.5)
Chloride: 106 mEq/L (ref 96–112)
Creatinine, Ser: 0.48 mg/dL — ABNORMAL LOW (ref 0.50–1.10)
GFR calc non Af Amer: >90 mL/min (ref >90)
GLUCOSE: 106 mg/dL — AB (ref 70–99)
POTASSIUM: 4.4 meq/L (ref 3.7–5.3)
Sodium: 147 mEq/L (ref 137–147)

## 2013-11-20 LAB — CBC WITH DIFFERENTIAL/PLATELET
Basophils Absolute: 0 10*3/uL (ref 0.0–0.1)
Basophils Relative: 0 % (ref 0–1)
EOS ABS: 0.1 10*3/uL (ref 0.0–0.7)
Eosinophils Relative: 1 % (ref 0–5)
HCT: 44.1 % (ref 36.0–46.0)
Hemoglobin: 13.6 g/dL (ref 12.0–15.0)
LYMPHS ABS: 1 10*3/uL (ref 0.7–4.0)
Lymphocytes Relative: 20 % (ref 12–46)
MCH: 30.2 pg (ref 26.0–34.0)
MCHC: 30.8 g/dL (ref 30.0–36.0)
MCV: 97.8 fL (ref 78.0–100.0)
Monocytes Absolute: 0.7 10*3/uL (ref 0.1–1.0)
Monocytes Relative: 14 % — ABNORMAL HIGH (ref 3–12)
NEUTROS PCT: 64 % (ref 43–77)
Neutro Abs: 3.3 10*3/uL (ref 1.7–7.7)
Platelets: ADEQUATE 10*3/uL (ref 150–400)
RBC: 4.51 MIL/uL (ref 3.87–5.11)
RDW: 14.8 % (ref 11.5–15.5)
WBC: 5.1 10*3/uL (ref 4.0–10.5)

## 2013-11-20 LAB — MRSA PCR SCREENING: MRSA BY PCR: NEGATIVE

## 2013-11-20 LAB — STREP PNEUMONIAE URINARY ANTIGEN: Strep Pneumo Urinary Antigen: NEGATIVE

## 2013-11-20 MED ORDER — SODIUM CHLORIDE 0.9 % IV SOLN
INTRAVENOUS | Status: DC
  Administered 2013-11-20: 1000 mL via INTRAVENOUS

## 2013-11-20 MED ORDER — ENOXAPARIN SODIUM 40 MG/0.4ML ~~LOC~~ SOLN
40.0000 mg | SUBCUTANEOUS | Status: DC
  Administered 2013-11-20 – 2013-11-23 (×4): 40 mg via SUBCUTANEOUS
  Filled 2013-11-20 (×5): qty 0.4

## 2013-11-20 MED ORDER — DEXTROSE 5 % IV SOLN
250.0000 mg | Freq: Three times a day (TID) | INTRAVENOUS | Status: DC
  Administered 2013-11-20 – 2013-11-23 (×9): 250 mg via INTRAVENOUS
  Filled 2013-11-20 (×11): qty 2.5

## 2013-11-20 MED ORDER — DEXTROSE 5 % IV SOLN: 1.0000 g | Freq: Three times a day (TID) | INTRAVENOUS | Status: DC

## 2013-11-20 MED ORDER — SODIUM CHLORIDE 0.9 % IV SOLN: INTRAVENOUS | Status: AC

## 2013-11-20 MED ORDER — LEVOTHYROXINE SODIUM 100 MCG IV SOLR
12.5000 ug | Freq: Every day | INTRAVENOUS | Status: DC
  Administered 2013-11-21 – 2013-11-23 (×3): 12.5 ug via INTRAVENOUS
  Filled 2013-11-20 (×3): qty 5

## 2013-11-20 MED ORDER — BUDESONIDE 0.25 MG/2ML IN SUSP
0.2500 mg | Freq: Two times a day (BID) | RESPIRATORY_TRACT | Status: DC
  Administered 2013-11-20 – 2013-11-23 (×6): 0.25 mg via RESPIRATORY_TRACT
  Filled 2013-11-20 (×9): qty 2

## 2013-11-20 MED ORDER — SODIUM CHLORIDE 0.9 % IV SOLN
250.0000 mg | Freq: Two times a day (BID) | INTRAVENOUS | Status: DC
  Administered 2013-11-20 – 2013-11-23 (×7): 250 mg via INTRAVENOUS
  Filled 2013-11-20 (×8): qty 2.5

## 2013-11-20 MED ORDER — ARFORMOTEROL TARTRATE 15 MCG/2ML IN NEBU
15.0000 ug | INHALATION_SOLUTION | Freq: Two times a day (BID) | RESPIRATORY_TRACT | Status: DC
  Administered 2013-11-20 – 2013-11-23 (×5): 15 ug via RESPIRATORY_TRACT
  Filled 2013-11-20 (×9): qty 2

## 2013-11-20 MED ORDER — IPRATROPIUM-ALBUTEROL 0.5-2.5 (3) MG/3ML IN SOLN: 3.0000 mL | RESPIRATORY_TRACT | Status: DC

## 2013-11-20 MED ORDER — IPRATROPIUM-ALBUTEROL 0.5-2.5 (3) MG/3ML IN SOLN
3.0000 mL | RESPIRATORY_TRACT | Status: DC | PRN
  Administered 2013-11-22: 3 mL via RESPIRATORY_TRACT
  Filled 2013-11-20: qty 3

## 2013-11-20 MED ORDER — INFLUENZA VAC SPLIT QUAD 0.5 ML IM SUSY
0.5000 mL | PREFILLED_SYRINGE | INTRAMUSCULAR | Status: AC
  Administered 2013-11-21: 0.5 mL via INTRAMUSCULAR
  Filled 2013-11-20 (×2): qty 0.5

## 2013-11-20 NOTE — Progress Notes (Addendum)
Patient ID: Kristi Perry, female   DOB: [birth year], 48 y.o.   MRN: [id number] [facility] PROGRESS NOTE  Kristi Perry MKL:[id number] DOB: 1965-03-30 DOA: 11/24/13 PCP: Kristi Kayser, MD  Brief narrative: 48 year old female with history of cerebral palsy, bed bound, history of seizure disorder, hypothyroidism who presented from SNF for respiratory distress, agonal breathing. On admission nsal trumpet was placed which improved patient's breathing pattern and responsiveness and it also increased her oxygen saturation form 89% to 100%. Pt was started on antibiotics for possible HCAP. She was admitted for SDU.   Assessment/Plan:    Principal Problem: Acute respiratory failure with hypoxia  Likely secondary to aspiration pneumonia and HCAP  We will restart brovana and pulmicort nebulizer treatments per home regimen. Added duoneb every 4 hours PRN shortness of breath and wheezing.   Continue vancomycin and cefepime for treatment of aspiration and healthcare acquired pneumonia.  Follow up blood culture results, strep pneumonia and legionella results.  Pt currently requires Ventimask to keep O2 saturation above 90%                 Active Problems: Sepsis  Sepsis criteria met with initial vitals signs HR 115, RR 9-21, oxygen saturation 89%. WBC count was 3.4 on admission. Lactic acid was WNL. CXR on admission showed possible bibasilar pneumonia. UA with evidence of UTI.  Treatment initiated with proper broad spectrum abx, vanco and cefepime.   IV fluids startd on admission, rate now 50 cc/hr  Aspiration pneumonitis / HCAP  Management as above with vanco and cefepime  Follow up blood culture results, legionella and strep pneumoniae   Aspiration precaution  SLP evaluation.   UTI  Large leukocytes seen on UA on admission. Cefepime will cover for UTI. Follow up urine culture result.    Cerebral palsy / Functional quadriplegia  No change; bedbound   History of seizure  disorder  Restart Keppra, Depakote. No IV alternative for Lamictal.  Seizure precaution.   History of hypothyroidism  Restart levothyroxine 12.5 mcg daily IV DVT Prophylaxis   Lovenox subQ ordered.    Code Status: DNR/DNI.  Family Communication: family not at the bedside this am Disposition Plan: Home when stable. Remains in SDU.   IV Access:   Peripheral IV Procedures and diagnostic studies:    Dg Chest Port 1 View 2013/11/24   Mild patchy bibasilar airspace disease could represent pneumonia, edema, or aspiration pneumonitis.     Medical Consultants:   None  Other Consultants:   None Anti-Infectives:   Vancomycin 2013/11/24 --> Cefepime 24-Nov-2013 -->    Leisa Lenz, MD  [facility] Pager [phone]  If 7PM-7AM, please contact night-coverage [email or web address] Password [email or web address] 11/20/2013, 2:08 PM   LOS: 1 day    HPI/Subjective: No acute overnight events.  Objective: Filed Vitals:   11/20/13 0600 11/20/13 0700 11/20/13 0800 11/20/13 1200  BP: 130/68 126/60    Pulse: 99 94    Temp:   97.7 F (36.5 C) 97.8 F (36.6 C)  TempSrc:   Axillary Axillary  Resp: 13 12    Height:      Weight:      SpO2: 100% 100%      Intake/Output Summary (Last 24 hours) at 11/20/13 1408 Last data filed at 11/20/13 1100  Gross per 24 hour  Intake   1350 ml  Output    400 ml  Net    950 ml    Exam:   General:  Pt is non verbal, has Ventimask on;  no distress  Cardiovascular: Regular rate and rhythm, S1/S2 appreciated   Respiratory: congested with rhonchi in upper lung lobes, no wheezing   Abdomen: Soft, non tender, non distended, bowel sounds present  Extremities: No edema, pulses DP and PT palpable bilaterally  Neuro: Grossly nonfocal  Data Reviewed: Basic Metabolic Panel:  Recent Labs Lab 11/19/13 2058 11/20/13 0350  NA 146 147  K 3.6* 4.4  CL 106 106  CO2 30 32  GLUCOSE 111* 106*  BUN 14 11  CREATININE 0.47* 0.48*  CALCIUM 8.6 8.7   Liver Function  Tests:  Recent Labs Lab 11/19/13 2058  AST 20  ALT 21  ALKPHOS 48  BILITOT 0.3  PROT 6.2  ALBUMIN 3.3*   No results found for this basename: LIPASE, AMYLASE,  in the last 168 hours No results found for this basename: AMMONIA,  in the last 168 hours CBC:  Recent Labs Lab 11/19/13 1945 11/20/13 0350  WBC 3.4* 5.1  NEUTROABS 1.7 3.3  HGB 14.1 13.6  HCT 44.9 44.1  MCV 96.8 97.8  PLT 178 PLATELET CLUMPS NOTED ON SMEAR, COUNT APPEARS ADEQUATE   Cardiac Enzymes: No results found for this basename: CKTOTAL, CKMB, CKMBINDEX, TROPONINI,  in the last 168 hours BNP: No components found with this basename: POCBNP,  CBG: No results found for this basename: GLUCAP,  in the last 168 hours  MRSA PCR SCREENING     Status: None   Collection Time    11/20/13  2:54 AM      Result Value Ref Range Status   MRSA by PCR NEGATIVE  NEGATIVE Final     Scheduled Meds: . arformoterol  15 mcg Nebulization BID  . budesonide  0.25 mg Nebulization BID  . ceFEPime (MAXIPIME) IV  1 g Intravenous 3 times per day  . enoxaparin (LOVENOX)  40 mg Subcutaneous Q24H  . ipratropium-albuterol  3 mL Nebulization Q4H  . levETIRAcetam  250 mg Intravenous BID  . [START ON 11/21/2013] levothyroxine  12.5 mcg Intravenous Daily  . valproate sodium  250 mg Intravenous 3 times per day  . vancomycin  1,000 mg Intravenous Q8H

## 2013-11-21 DIAGNOSIS — A419 Sepsis, unspecified organism: Principal | ICD-10-CM

## 2013-11-21 LAB — URINE CULTURE

## 2013-11-21 LAB — LEGIONELLA ANTIGEN, URINE: Legionella Antigen, Urine: NEGATIVE

## 2013-11-21 LAB — VANCOMYCIN, TROUGH: Vancomycin Tr: 13.9 ug/mL (ref 10.0–20.0)

## 2013-11-21 MED ORDER — CHLORHEXIDINE GLUCONATE 0.12 % MT SOLN
15.0000 mL | Freq: Two times a day (BID) | OROMUCOSAL | Status: DC
Start: 2013-11-21 — End: 2013-11-23
  Administered 2013-11-21 – 2013-11-23 (×5): 15 mL via OROMUCOSAL
  Filled 2013-11-21 (×6): qty 15

## 2013-11-21 MED ORDER — CETYLPYRIDINIUM CHLORIDE 0.05 % MT LIQD
7.0000 mL | Freq: Two times a day (BID) | OROMUCOSAL | Status: DC
  Administered 2013-11-21 – 2013-11-22 (×3): 7 mL via OROMUCOSAL

## 2013-11-21 MED ORDER — SODIUM CHLORIDE 0.9 % IV SOLN
INTRAVENOUS | Status: DC
  Administered 2013-11-21: 22:00:00 via INTRAVENOUS

## 2013-11-21 MED ORDER — SODIUM CHLORIDE 0.9 % IV SOLN
1250.0000 mg | Freq: Three times a day (TID) | INTRAVENOUS | Status: DC
  Administered 2013-11-21 – 2013-11-23 (×6): 1250 mg via INTRAVENOUS
  Filled 2013-11-21 (×7): qty 1250

## 2013-11-21 NOTE — Progress Notes (Signed)
REPORT GIVEN TO 5EAST RN RECEIVING PATIENT. NO CHANGE IN PATIENT CONDITION.

## 2013-11-21 NOTE — Progress Notes (Signed)
Patient ID: Kristi Perry, female   DOB: Jun 12, 1965, 48 y.o.   MRN: [id number] [facility] PROGRESS NOTE  Yui Mulvaney Haymore ZLD:[id number] DOB: 10/17/1965 DOA: 2013/12/14 PCP: Ezequiel Kayser, MD  Brief narrative: 48 year old female with history of cerebral palsy, bed bound, history of seizure disorder, hypothyroidism who presented from SNF for respiratory distress, agonal breathing. On admission nsal trumpet was placed which improved patient's breathing pattern and responsiveness and it also increased her oxygen saturation form 89% to 100%. Pt was started on antibiotics for possible HCAP. She was admitted for SDU.   Assessment/Plan:   Principal Problem:  Acute respiratory failure with hypoxia  Likely secondary to aspiration pneumonia and HCAP  Respiratory status better this am. Continue brovana and pulmicort nebulizer treatments per home regimen. Continue duoneb every 4 hours PRN shortness of breath and wheezing.  Continue vancomycin and cefepime for treatment of aspiration and healthcare acquired pneumonia.  Blood cultures pending, strep pneumonia negative; legionella pending. Medically stable to be transferred to medical floor. Active Problems:  Sepsis  Sepsis criteria met with initial vitals signs HR 115, RR 9-21, oxygen saturation 89%. WBC count was 3.4 on admission. Lactic acid was WNL. CXR on admission showed possible bibasilar pneumonia. UA with evidence of UTI.  Treatment initiated with proper broad spectrum abx, vanco and cefepime.  IV fluids startd on admission, rate now 50 cc/hr. Continue until PO intake improves. Aspiration pneumonitis / HCAP  Management as above with vanco and cefepime  Aspiration precaution  SLP evaluation. UTI (urinary tract infection) Large leukocytes seen on UA on admission. Cefepime will cover for UTI.  Urine culture with insignificant growth.  Cerebral palsy / Functional quadriplegia  No change; bedbound History of seizure disorder  Restart Keppra,  Depakote. No IV alternative for Lamictal.  Seizure precaution. History of hypothyroidism  Continue levothyroxine 12.5 mcg daily IV DVT Prophylaxis  Lovenox subQ ordered.    Code Status: DNR/DNI.  Family Communication: family not at the bedside this am  Disposition Plan: transfer to medical floor today.   IV Access:   Peripheral IV  Procedures and diagnostic studies:    Dg Chest Port 1 View 14-Dec-2013 Mild patchy bibasilar airspace disease could represent pneumonia, edema, or aspiration pneumonitis.   Medical Consultants:   None   Other Consultants:   None  Anti-Infectives:   Vancomycin 2013-12-14 -->  Cefepime 12-14-2013 -->    Leisa Lenz, MD  [facility] Pager [phone]  If 7PM-7AM, please contact night-coverage [email or web address] Password [email or web address] 11/21/2013, 10:14 AM   LOS: 2 days    HPI/Subjective: No acute overnight events.  Objective: Filed Vitals:   11/21/13 0700 11/21/13 0753 11/21/13 0800 11/21/13 0900  BP: 128/72  144/59 146/67  Pulse: 98  92 98  Temp:   98.9 F (37.2 C)   TempSrc:   Oral   Resp: _0 Height:      Weight:      SpO2: 93% 94% 95% 93%    Intake/Output Summary (Last 24 hours) at 11/21/13 1014 Last data filed at 11/21/13 0900  Gross per 24 hour  Intake 1907.5 ml  Output   2750 ml  Net -842.5 ml    Exam:   General:  Pt is much more alert this am, able to respond to questions, no distress  Cardiovascular: Regular rate and rhythm, S1/S2 appreciated   Respiratory: mild rhonchi in upper lung lobes, no wheezing   Abdomen: Soft, non tender, non distended, bowel sounds present  Extremities: No edema,  pulses DP and PT palpable bilaterally  Neuro: Grossly nonfocal  Data Reviewed: Basic Metabolic Panel:  Recent Labs Lab 11/19/13 2058 11/20/13 0350  NA 146 147  K 3.6* 4.4  CL 106 106  CO2 30 32  GLUCOSE 111* 106*  BUN 14 11  CREATININE 0.47* 0.48*  CALCIUM 8.6 8.7   Liver Function Tests:  Recent Labs Lab  11/19/13 2058  AST 20  ALT 21  ALKPHOS 48  BILITOT 0.3  PROT 6.2  ALBUMIN 3.3*   No results found for this basename: LIPASE, AMYLASE,  in the last 168 hours No results found for this basename: AMMONIA,  in the last 168 hours CBC:  Recent Labs Lab 11/19/13 1945 11/20/13 0350  WBC 3.4* 5.1  NEUTROABS 1.7 3.3  HGB 14.1 13.6  HCT 44.9 44.1  MCV 96.8 97.8  PLT 178 PLATELET CLUMPS NOTED ON SMEAR, COUNT APPEARS ADEQUATE   Cardiac Enzymes: No results found for this basename: CKTOTAL, CKMB, CKMBINDEX, TROPONINI,  in the last 168 hours BNP: No components found with this basename: POCBNP,  CBG: No results found for this basename: GLUCAP,  in the last 168 hours  URINE CULTURE     Status: None   Collection Time    11/19/13 10:30 PM      Result Value Ref Range Status   Specimen Description URINE, CATHETERIZED   Final   Value: INSIGNIFICANT GROWTH     Performed at [facility]   Report Status 11/21/2013 FINAL   Final  MRSA PCR SCREENING     Status: None   Collection Time    11/20/13  2:54 AM      Result Value Ref Range Status   MRSA by PCR NEGATIVE  NEGATIVE Final     Scheduled Meds: . arformoterol  15 mcg Nebulization BID  . budesonide  0.25 mg Nebulization BID  . ceFEPime (MAXIPIME) IV  1 g Intravenous 3 times per day  . enoxaparin (LOVENOX) injection  40 mg Subcutaneous Q24H  . levETIRAcetam  250 mg Intravenous BID  . levothyroxine  12.5 mcg Intravenous Daily  . valproate sodium  250 mg Intravenous 3 times per day  . vancomycin  1,250 mg Intravenous Q8H   Continuous Infusions: . sodium chloride 50 mL/hr at 11/21/13 [phone]

## 2013-11-21 NOTE — Progress Notes (Signed)
Nutrition Brief Note  Patient identified on the Malnutrition Screening Tool (MST) Report  Wt Readings from Last 15 Encounters:  11/20/13 193 lb 2 oz (87.6 kg)  04/03/12 216 lb 0.8 oz (98 kg)  02/27/12 214 lb 4.6 oz (97.2 kg)  12/05/11 211 lb (95.709 kg)  12/05/11 211 lb (95.709 kg)  11/07/11 206 lb 2.1 oz (93.5 kg)  11/07/11 206 lb 2.1 oz (93.5 kg)  10/07/11 206 lb 9.1 oz (93.7 kg)  10/07/11 206 lb 9.1 oz (93.7 kg)  10/07/11 206 lb 9.1 oz (93.7 kg)  08/20/11 204 lb (92.534 kg)    Body mass index is 37.72 kg/(m^2). Patient meets criteria for obesity based on current BMI.   Current diet order is NPO, patient is asking for food at this time. She says that she is hungry. Labs and medications reviewed.   No nutrition interventions warranted at this time. If nutrition issues arise, please consult RD.   Ebbie Latus RD, LDN

## 2013-11-21 NOTE — Evaluation (Signed)
Clinical/Bedside Swallow Evaluation Patient Details  Name: Kristi Perry MRN: [id number] Date of Birth: [birth year]  Today's Date: 11/21/2013 Time: 4098-1191 SLP Time Calculation (min): 28 min  Past Medical History:  Past Medical History  Diagnosis Date  . Sepsis(995.91)   . Hypertension   . DVT (deep vein thrombosis) in pregnancy   . Depression   . Chronic pain syndrome   . Allergic rhinitis   . Acute respiratory failure   . Epilepsy   . Migraine   . Chronic airway obstruction   . Cerebral palsy   . Dysphagia   . Hiatal hernia   . Morbid obesity 09-25-11    requires Hoyer lift. pt. doesn't stand or ambulate.  . Incontinent of urine 09-25-11    hx.  . Incontinent of feces 09-25-11    hx.  . Vitamin D deficiency   . Sepsis(995.91)     hx of   . Hyperlipidemia   . Mild intellectual disabilities   . Neurogenic bladder   . Hypothyroidism   . Anxiety   . Unspecified psychosis   . Muscle weakness   . Dysphasia   . Pneumonia     hx of asp pneumonia 1/11-1/19/12  . Sacral decubitus ulcer     hx of  . Nephrolithiasis 04/05/2012   Past Surgical History:  Past Surgical History  Procedure Laterality Date  . Nephrolithotomy  10/07/2011    Procedure: NEPHROLITHOTOMY PERCUTANEOUS;  Surgeon: Marcine Matar, MD;  Location: [facility];  Service: Urology;  Laterality: Right;  kidney   . Cystoscopy with ureteroscopy  11/07/2011    Procedure: CYSTOSCOPY WITH URETEROSCOPY;  Surgeon: Marcine Matar, MD;  Location: [facility];  Service: Urology;  Laterality: Right;  Removal of right double J stent, Insertion right double J stent  . Stone extraction with basket  11/07/2011    Procedure: STONE EXTRACTION WITH BASKET;  Surgeon: Marcine Matar, MD;  Location: [facility];  Service: Urology;  Laterality: Right;  . Nephrolithotomy  12/06/2011    Procedure: NEPHROLITHOTOMY PERCUTANEOUS;  Surgeon: Marcine Matar, MD;  Location: [facility];  Service: Urology;  Laterality: Right;   REPEAT PCNL    HPI:  48  yo female with h/o cerebral palsy, aspiration pna Jan 2012, dysphagia (no ST documentation found, pt. states she was at [facility]), epilepsy, hiatal hernia.  Per MD note pt. hypoxic on arrival.  Pt. reported consuming thickened liquids in the past.  CXR Mild patchy bibasilar airspace disease could represent pneumonia, edema, or aspiration pneumonitis.    Assessment / Plan / Recommendation Clinical Impression  Pt. with periods of lethargy ("zoning out") during assessment.  Exhibited discoordination of respirations/swallow with what appeared apneic periords as well as intermittent inhalations immediately post swallow indicative of disfunction.  Given clinical observations, lethargy and history of dysphagia, recommend continue NPO with ST returning nect date.  Informed RN several ice chips following oral care would be allowed if pt. requests.      Aspiration Risk   (mod-severe)    Diet Recommendation NPO;Ice chips PRN after oral care        Other  Recommendations Oral Care Recommendations: Oral care BID   Follow Up Recommendations   (TBD)    Frequency and Duration min 2x/week  2 weeks   Pertinent Vitals/Pain No pain         Swallow Study          Oral/Motor/Sensory Function Overall Oral Motor/Sensory Function: Impaired at baseline (hypotonia with cerebral palsy)   Ice Chips Ice chips:  Impaired Presentation: Spoon Oral Phase Impairments: Reduced lingual movement/coordination Oral Phase Functional Implications: Prolonged oral transit (midly prolonged)   Thin Liquid Thin Liquid: Impaired Presentation: Cup;Spoon;Straw Oral Phase Impairments: Reduced labial seal Pharyngeal  Phase Impairments: Suspected delayed Swallow (discoordinated respiration/swallow)    Nectar Thick Nectar Thick Liquid: Not tested   Honey Thick Honey Thick Liquid: Not tested   Puree Puree: Impaired Presentation: Spoon Pharyngeal Phase Impairments: Suspected delayed Swallow (respiratory/swallow discoordination)    Solid   GO    Solid: Not tested       Royce Macadamia 11/21/2013,5:46 PM   Breck Coons Lonell Face.Ed [employer] [phone]

## 2013-11-21 NOTE — Progress Notes (Signed)
ANTIBIOTIC CONSULT NOTE - FOLLOW UP  Pharmacy Consult for vancomycin Indication: pneumonia  Allergies  Allergen Reactions  . Sulfa Antibiotics Hives  . Tape Rash    Paper tape    Patient Measurements: Height: 5' (152.4 cm) Weight: 193 lb 2 oz (87.6 kg) IBW/kg (Calculated) : 45.5 Adjusted Body Weight:   Vital Signs: Temp: 98.6 F (37 C) (09/20 0000) Temp src: Oral (09/20 0000) BP: 150/66 mmHg (09/20 0400) Pulse Rate: 102 (09/20 0400) Intake/Output from previous day: 09/19 0701 - 09/20 0700 In: 1680 [I.V.:875; IV Piggyback:805] Out: 1750 [Urine:1750] Intake/Output from this shift: Total I/O In: 455 [I.V.:50; IV Piggyback:405] Out: 900 [Urine:900]  Labs:  Recent Labs  11/19/13 1945 11/19/13 2058 11/20/13 0350  WBC 3.4*  --  5.1  HGB 14.1  --  13.6  PLT 178  --  PLATELET CLUMPS NOTED ON SMEAR, COUNT APPEARS ADEQUATE  CREATININE  --  0.47* 0.48*   Estimated Creatinine Clearance: 85.5 ml/min (by C-G formula based on Cr of 0.48).  Recent Labs  11/21/13 0337  VANCOTROUGH 13.9     Microbiology: Recent Results (from the past 720 hour(s))  URINE CULTURE     Status: None   Collection Time    11/19/13 10:30 PM      Result Value Ref Range Status   Specimen Description URINE, CATHETERIZED   Final   Special Requests NONE   Final   Culture  Setup Time     Final   Value: 11/20/2013 00:46     Performed at [facility] Count     Final   Value: 3,000 COLONIES/ML     Performed at [facility]   Culture     Final   Value: INSIGNIFICANT GROWTH     Performed at [facility]   Report Status 11/21/2013 FINAL   Final  MRSA PCR SCREENING     Status: None   Collection Time    11/20/13  2:54 AM      Result Value Ref Range Status   MRSA by PCR NEGATIVE  NEGATIVE Final   Comment:            The GeneXpert MRSA Assay (FDA     approved for NASAL specimens     only), is one component of a     comprehensive MRSA colonization     surveillance  program. It is not     intended to diagnose MRSA     infection nor to guide or     monitor treatment for     MRSA infections.    Anti-infectives   Start     Dose/Rate Route Frequency Ordered Stop   11/21/13 1200  vancomycin (VANCOCIN) 1,250 mg in sodium chloride 0.9 % 250 mL IVPB     1,250 mg 166.7 mL/hr over 90 Minutes Intravenous Every 8 hours 11/21/13 0530     11/20/13 0400  ceFEPIme (MAXIPIME) 1 g in dextrose 5 % 50 mL IVPB     1 g 100 mL/hr over 30 Minutes Intravenous 3 times per day 11/19/13 2206     11/20/13 0400  vancomycin (VANCOCIN) IVPB 1000 mg/200 mL premix  Status:  Discontinued     1,000 mg 200 mL/hr over 60 Minutes Intravenous Every 8 hours 11/19/13 2206 11/21/13 0530   11/20/13 0257  ceFEPIme (MAXIPIME) 1 g in dextrose 5 % 50 mL IVPB  Status:  Discontinued     1 g 100 mL/hr over 30 Minutes Intravenous 3  times per day 11/20/13 0257 11/20/13 0302   11/19/13 1945  ceFEPIme (MAXIPIME) 2 g in dextrose 5 % 50 mL IVPB     2 g 100 mL/hr over 30 Minutes Intravenous  Once 11/19/13 1935 11/19/13 2053   11/19/13 1945  vancomycin (VANCOCIN) IVPB 1000 mg/200 mL premix     1,000 mg 200 mL/hr over 60 Minutes Intravenous  Once 11/19/13 1935 11/19/13 2200      Assessment: Patient with low vancomycin level, prior doses charted correctly.    Goal of Therapy:  Vancomycin trough level 15-20 mcg/ml  Plan:  Measure antibiotic drug levels at steady state Follow up culture results Change vancomycin to  iv q8hr  Darlina Guys, Jacquenette Shone Crowford 11/21/2013,5:31 AM

## 2013-11-22 MED ORDER — CHLORHEXIDINE GLUCONATE 0.12 % MT SOLN: 15.0000 mL | Freq: Two times a day (BID) | OROMUCOSAL | Status: DC

## 2013-11-22 MED ORDER — RESOURCE THICKENUP CLEAR PO POWD
ORAL | Status: DC | PRN
  Filled 2013-11-22: qty 125

## 2013-11-22 MED ORDER — LEVOFLOXACIN 750 MG PO TABS: 750.0000 mg | ORAL_TABLET | Freq: Every day | ORAL | Status: DC

## 2013-11-22 MED ORDER — LORAZEPAM 2 MG/ML IJ SOLN
1.0000 mg | Freq: Once | INTRAMUSCULAR | Status: AC
  Administered 2013-11-22: 1 mg via INTRAVENOUS
  Filled 2013-11-22: qty 1

## 2013-11-22 MED ORDER — RISPERIDONE 2 MG PO TABS: 2.0000 mg | ORAL_TABLET | Freq: Every day | ORAL | Status: DC

## 2013-11-22 MED ORDER — STARCH (THICKENING) PO POWD
ORAL | Status: DC | PRN
  Filled 2013-11-22: qty 227

## 2013-11-22 MED ORDER — FENTANYL 75 MCG/HR TD PT72: 75.0000 ug | MEDICATED_PATCH | TRANSDERMAL | Status: DC

## 2013-11-22 MED ORDER — HYDROCODONE-ACETAMINOPHEN 5-325 MG PO TABS: 1.0000 | ORAL_TABLET | Freq: Four times a day (QID) | ORAL | Status: DC | PRN

## 2013-11-22 MED ORDER — CETYLPYRIDINIUM CHLORIDE 0.05 % MT LIQD: 7.0000 mL | Freq: Two times a day (BID) | OROMUCOSAL | Status: DC

## 2013-11-22 MED ORDER — LORAZEPAM 2 MG/ML PO CONC: 1.0000 mg | Freq: Three times a day (TID) | ORAL | Status: DC | PRN

## 2013-11-22 MED ORDER — CLONAZEPAM 1 MG PO TABS: 1.0000 mg | ORAL_TABLET | Freq: Two times a day (BID) | ORAL | Status: DC

## 2013-11-22 MED ORDER — VENLAFAXINE HCL ER 150 MG PO CP24: 150.0000 mg | ORAL_CAPSULE | Freq: Every day | ORAL | Status: DC

## 2013-11-22 MED ORDER — RESOURCE THICKENUP CLEAR PO POWD: 1.0000 | ORAL | Status: DC | PRN

## 2013-11-22 NOTE — Discharge Summary (Signed)
Physician Discharge Summary  Kristi Perry WNU:[id number] DOB: 23-Nov-1965 DOA: 12-07-2013  PCP: Ezequiel Kayser, MD  Admit date: 12/07/13 Discharge date: 11/22/2013  Recommendations for Outpatient Follow-up:  1. Continue Levaquin 750 mg daily fo r7 days for pneumonia.  Dysphagia 1 diet recommended by SLP.  Discharge Diagnoses:  Principal Problem:   Acute respiratory failure with hypoxia Active Problems:   HCAP (healthcare-associated pneumonia)   Sepsis   Aspiration pneumonia   Cerebral palsy   Unspecified hypothyroidism   Seizures   UTI (urinary tract infection)    Discharge Condition: stable   Diet recommendation: as tolerated dysphagia 1 diet  History of present illness:  48 year old female with history of cerebral palsy, bed bound, history of seizure disorder, hypothyroidism who presented from SNF for respiratory distress, agonal breathing. On admission nsal trumpet was placed which improved patient's breathing pattern and responsiveness and it also increased her oxygen saturation form 89% to 100%. Pt was started on antibiotics for possible HCAP. She was admitted for SDU.   Assessment/Plan:   Principal Problem:  Acute respiratory failure with hypoxia  Likely secondary to aspiration pneumonia and HCAP  Continue brovana and pulmicort nebulizer treatments per home regimen. Continue xopenex PRN shortness of breath or wheezing  Was on vancomycin and cefepime for treatment of aspiration and healthcare acquired pneumonia. Levaquin for 7 days on discharge.  Blood cultures to date are negative, strep pneumonia negative; legionella negative.   Active Problems:  Sepsis  Sepsis criteria met with initial vitals signs HR 115, RR 9-21, oxygen saturation 89%. WBC count was 3.4 on admission. Lactic acid was WNL. CXR on admission showed possible bibasilar pneumonia. UA with evidence of UTI.  Treatment initiated with proper broad spectrum abx, vanco and cefepime. Levaquin 750 mg daily for  7 days on discharge.  Aspiration pneumonitis / HCAP  Management as above with vanco and cefepime until discharge. Levaquin for 7 days on discharge.  Dysphagia 1 diet per SLP evaluation.  UTI (urinary tract infection)  Large leukocytes seen on UA on admission. Cefepime covered for UTI. Levaquin also adequate for UTI treatment.  Urine culture with insignificant growth.  Cerebral palsy / Functional quadriplegia  No change; bedbound History of seizure disorder  Continue home meds for seizures.No seizures in hospital. History of hypothyroidism  Continue levothyroxine 25 mcq daily. DVT Prophylaxis  Lovenox subQ ordered while pt is in hospital.   Code Status: DNR/DNI.  Family Communication: family not at the bedside this am   IV Access:   Peripheral IV Procedures and diagnostic studies:    Dg Chest Port 1 View December 07, 2013 Mild patchy bibasilar airspace disease could represent pneumonia, edema, or aspiration pneumonitis.   Medical Consultants:   None   Other Consultants:   None   Anti-Infectives:   Vancomycin 12-07-2013 --> 11/22/2013 Cefepime 12/07/13 --> 11/22/2013 Levaquin for 7 days on discharge.    Signed:  Leisa Lenz, MD  [facility] 11/22/2013, 2:47 PM  Pager #: [phone]  Discharge Exam: Filed Vitals:   11/22/13 1415  BP: 111/53  Pulse: 80  Temp: 98.4 F (36.9 C)  Resp: 18   Filed Vitals:   11/22/13 0258 11/22/13 0530 11/22/13 1055 11/22/13 1415  BP: 103/74 117/54  111/53  Pulse:  82  80  Temp: 98.3 F (36.8 C) 98.2 F (36.8 C)  98.4 F (36.9 C)  TempSrc: Axillary Oral  Axillary  Resp: '20 20  18  ' Height:      Weight:      SpO2:  95% 96% 97%    General: Pt is not in acute distress Cardiovascular: Regular rate and rhythm, S1/S2 appreciated  Respiratory: bilateral air entry, no wheezing  Abdominal: Soft, non tender, non distended, bowel sounds +, no guarding Extremities: no edema, no cyanosis, pulses palpable bilaterally DP and PT Neuro:  Grossly nonfocal  Discharge Instructions  Discharge Instructions   Call MD for:  difficulty breathing, headache or visual disturbances    Complete by:  As directed      Call MD for:  persistant dizziness or light-headedness    Complete by:  As directed      Call MD for:  persistant nausea and vomiting    Complete by:  As directed      Call MD for:  severe uncontrolled pain    Complete by:  As directed      Diet - low sodium heart healthy    Complete by:  As directed      Discharge instructions    Complete by:  As directed   1. Continue Levaquin 750 mg daily fo r7 days for pneumonia.  Dysphagia 1 diet recommended by SLP.     Increase activity slowly    Complete by:  As directed             Medication List         albuterol 108 (90 BASE) MCG/ACT inhaler  Commonly known as:  PROVENTIL HFA;VENTOLIN HFA  Inhale 2 puffs into the lungs every 4 (four) hours as needed for wheezing or shortness of breath.     antiseptic oral rinse 0.05 % Liqd solution  Commonly known as:  CPC / CETYLPYRIDINIUM CHLORIDE 0.05%  7 mLs by Mouth Rinse route 2 times daily at 12 noon and 4 pm.     arformoterol 15 MCG/2ML Nebu  Commonly known as:  BROVANA  Take 15 mcg by nebulization 2 (two) times daily.     baclofen 10 MG tablet  Commonly known as:  LIORESAL  Take 10 mg by mouth 4 (four) times daily.     budesonide 0.25 MG/2ML nebulizer solution  Commonly known as:  PULMICORT  Take 0.25 mg by nebulization 2 (two) times daily.     chlorhexidine 0.12 % solution  Commonly known as:  PERIDEX  15 mLs by Mouth Rinse route 2 (two) times daily.     clonazePAM 1 MG tablet  Commonly known as:  KLONOPIN  Take 1 tablet (1 mg total) by mouth 2 (two) times daily.     divalproex 250 MG DR tablet  Commonly known as:  DEPAKOTE  Take 750 mg by mouth at bedtime.     fentaNYL 75 MCG/HR  Commonly known as:  DURAGESIC - dosed mcg/hr  Place 1 patch (75 mcg total) onto the skin every 3 (three) days.      HYDROcodone-acetaminophen 5-325 MG per tablet  Commonly known as:  NORCO/VICODIN  Take 1 tablet by mouth every 6 (six) hours as needed for moderate pain.     ketotifen 0.025 % ophthalmic solution  Commonly known as:  ZADITOR  Place 1 drop into both eyes 2 (two) times daily.     ketotifen 0.025 % ophthalmic solution  Commonly known as:  ZADITOR  Place 1 drop into both eyes 2 (two) times daily.     lamoTRIgine 100 MG tablet  Commonly known as:  LAMICTAL  Take 100 mg by mouth 2 (two) times daily.     levalbuterol 0.63 MG/3ML nebulizer solution  Commonly known  as:  XOPENEX  Take 3 mLs (0.63 mg total) by nebulization every 8 (eight) hours as needed for wheezing.     levETIRAcetam 250 MG tablet  Commonly known as:  KEPPRA  Take 250 mg by mouth every 12 (twelve) hours.     levofloxacin 750 MG tablet  Commonly known as:  LEVAQUIN  Take 1 tablet (750 mg total) by mouth daily.     levothyroxine 25 MCG tablet  Commonly known as:  SYNTHROID, LEVOTHROID  Take 25 mcg by mouth daily before breakfast.     LORazepam 2 MG/ML concentrated solution  Commonly known as:  ATIVAN  Take 0.5 mLs (1 mg total) by mouth every 8 (eight) hours as needed for anxiety.     MELATIN PO  Take 1 tablet by mouth at bedtime.     montelukast 10 MG tablet  Commonly known as:  SINGULAIR  Take 10 mg by mouth every morning.     potassium chloride 10 MEQ tablet  Commonly known as:  K-DUR  Take 10 mEq by mouth daily.     pravastatin 40 MG tablet  Commonly known as:  PRAVACHOL  Take 40 mg by mouth daily.     RESOURCE THICKENUP CLEAR Powd  Take 120 g by mouth as needed (to add to fluids for nectar thick).     risperiDONE 2 MG tablet  Commonly known as:  RISPERDAL  Take 1 tablet (2 mg total) by mouth at bedtime.     risperiDONE microspheres 50 MG injection  Commonly known as:  RISPERDAL CONSTA  Inject 50 mg into the muscle every 14 (fourteen) days.     senna 8.6 MG Tabs tablet  Commonly known as:   SENOKOT  Take 2 tablets by mouth daily.     sodium chloride 0.65 % nasal spray  Commonly known as:  OCEAN  Place 1 spray into the nose 2 (two) times daily.     venlafaxine XR 150 MG 24 hr capsule  Commonly known as:  EFFEXOR-XR  Take 1 capsule (150 mg total) by mouth daily with breakfast.     vitamin C 500 MG tablet  Commonly known as:  ASCORBIC ACID  Take 500 mg by mouth 2 (two) times daily.     Vitamin D (Ergocalciferol) 50000 UNITS Caps capsule  Commonly known as:  DRISDOL  Take 50,000 Units by mouth every 7 (seven) days.           Follow-up Information   Follow up with [facility], MD. Schedule an appointment as soon as possible for a visit in 2 weeks. (Follow up appt after recent hospitalization)    Specialty:  Internal Medicine   Contact information:   [place] [address] [phone]        The results of significant diagnostics from this hospitalization (including imaging, microbiology, ancillary and laboratory) are listed below for reference.    Significant Diagnostic Studies: Dg Chest Port 1 View  11/19/2013   CLINICAL DATA:  Fever  EXAM: PORTABLE CHEST - 1 VIEW  COMPARISON:  Radiographs 08/26/2012  FINDINGS: Normal cardiac silhouette. There is low lung volumes. There is patchy bibasilar airspace disease. No pneumothorax.  IMPRESSION: Mild patchy bibasilar airspace disease could represent pneumonia, edema, or aspiration pneumonitis.   Electronically Signed   By: Suzy Bouchard M.D.   On: 11/19/2013 20:04    Microbiology: Recent Results (from the past 240 hour(s))  CULTURE, BLOOD (ROUTINE X 2)     Status: None   Collection  Time    11/19/13  7:45 PM      Result Value Ref Range Status   Specimen Description BLOOD RIGHT ARM   Final   Special Requests BOTTLES DRAWN AEROBIC AND ANAEROBIC 5ML   Final   Culture  Setup Time     Final   Value: 11/20/2013 00:52     Performed at [facility]   Culture     Final   Value:        BLOOD  CULTURE RECEIVED NO GROWTH TO DATE CULTURE WILL BE HELD FOR 5 DAYS BEFORE ISSUING A FINAL NEGATIVE REPORT     Performed at [facility]   Report Status PENDING   Incomplete  CULTURE, BLOOD (ROUTINE X 2)     Status: None   Collection Time    11/19/13  7:45 PM      Result Value Ref Range Status   Specimen Description BLOOD LEFT ARM   Final   Special Requests BOTTLES DRAWN AEROBIC AND ANAEROBIC 5ML   Final   Culture  Setup Time     Final   Value: 11/20/2013 00:52     Performed at [facility]   Culture     Final   Value:        BLOOD CULTURE RECEIVED NO GROWTH TO DATE CULTURE WILL BE HELD FOR 5 DAYS BEFORE ISSUING A FINAL NEGATIVE REPORT     Performed at [facility]   Report Status PENDING   Incomplete  URINE CULTURE     Status: None   Collection Time    11/19/13 10:30 PM      Result Value Ref Range Status   Specimen Description URINE, CATHETERIZED   Final   Special Requests NONE   Final   Culture  Setup Time     Final   Value: 11/20/2013 00:46     Performed at [place]     Final   Value: 3,000 COLONIES/ML     Performed at [facility]   Culture     Final   Value: INSIGNIFICANT GROWTH     Performed at [facility]   Report Status 11/21/2013 FINAL   Final  MRSA PCR SCREENING     Status: None   Collection Time    11/20/13  2:54 AM      Result Value Ref Range Status   MRSA by PCR NEGATIVE  NEGATIVE Final   Comment:            The GeneXpert MRSA Assay (FDA     approved for NASAL specimens     only), is one component of a     comprehensive MRSA colonization     surveillance program. It is not     intended to diagnose MRSA     infection nor to guide or     monitor treatment for     MRSA infections.     Labs: Basic Metabolic Panel:  Recent Labs Lab 11/19/13 2058 11/20/13 0350  NA 146 147  K 3.6* 4.4  CL 106 106  CO2 30 32  GLUCOSE 111* 106*  BUN 14 11  CREATININE 0.47* 0.48*  CALCIUM 8.6 8.7    Liver Function Tests:  Recent Labs Lab 11/19/13 2058  AST 20  ALT 21  ALKPHOS 48  BILITOT 0.3  PROT 6.2  ALBUMIN 3.3*   No results found for this basename: LIPASE, AMYLASE,  in the last 168 hours  No results found for this basename: AMMONIA,  in the last 168 hours CBC:  Recent Labs Lab 11/19/13 1945 11/20/13 0350  WBC 3.4* 5.1  NEUTROABS 1.7 3.3  HGB 14.1 13.6  HCT 44.9 44.1  MCV 96.8 97.8  PLT 178 PLATELET CLUMPS NOTED ON SMEAR, COUNT APPEARS ADEQUATE   Cardiac Enzymes: No results found for this basename: CKTOTAL, CKMB, CKMBINDEX, TROPONINI,  in the last 168 hours BNP: BNP (last 3 results) No results found for this basename: PROBNP,  in the last 8760 hours CBG: No results found for this basename: GLUCAP,  in the last 168 hours  Time coordinating discharge: Over 30 minutes

## 2013-11-22 NOTE — Progress Notes (Signed)
Notified by RN pt with some change in mental status. Hold discharge and reassess in am if pt stable for D/C. [facility] [phone].

## 2013-11-22 NOTE — Progress Notes (Signed)
Clinical Social Work Department BRIEF PSYCHOSOCIAL ASSESSMENT 11/22/2013  Patient:  Kristi Perry, Kristi Perry     Account Number:  [id number]     Admit date:  11/19/2013  Clinical Social Worker:  Dennison Bulla  Date/Time:  11/22/2013 12:00 N  Referred by:  Physician  Date Referred:  11/22/2013 Referred for  SNF Placement   Other Referral:   Interview type:  Other - See comment Other interview type:   Facility    PSYCHOSOCIAL DATA Living Status:  FACILITY Admitted from facility:  [facility] Level of care:  Skilled Nursing Facility Primary support name:  Onalee Hua Primary support relationship to patient:  PARENT Degree of support available:   Unknown at this time    CURRENT CONCERNS Current Concerns  Post-Acute Placement   Other Concerns:    SOCIAL WORK ASSESSMENT / PLAN CSW received referral in order to assist with DC planning. Per chart review, patient is from [facility]. CSW reviewed chart and attempted to meet with patient. Patient is unable to communicate effectively and cannot fully participate in assessment.    No family was present so CSW reviewed chart. CSW called emergency numbers listed. CSW called [phone] and [phone] and was informed these were wrong numbers. [id number] and [phone] were disconnected. CSW spoke with SNF who reports patient is a long term resident at [facility]. SNF gave CSW additional number for [phone] for father Onalee Hua) and CSW called and left 2 messages on this number. SNF reports patient has limited visitors and it is often difficult to get in touch with family.    CSW will continue to follow. SNF is agreeable to accept when medically stable.   Assessment/plan status:  Psychosocial Support/Ongoing Assessment of Needs Other assessment/ plan:   Information/referral to community resources:   Will return to SNF    PATIENT'S/FAMILY'S RESPONSE TO PLAN OF CARE: Patient unable to participate in assessment. No family  present. SNF reports patient is a LT resident and they want her to return at DC. SNF reports that family is minimally involved and it is difficult to get them involved. SNF agreeable to contact CSW if family calls facility.       [place], [place] [phone]

## 2013-11-22 NOTE — Care Management Note (Signed)
    Page 1 of 1   11/22/2013     11:13:21 AM CARE MANAGEMENT NOTE 11/22/2013  Patient:  Kristi Perry, Kristi Perry   Account Number:  [id number]  Date Initiated:  11/22/2013  Documentation initiated by:  Lorenda Ishihara  Subjective/Objective Assessment:   48 yo female admitted pna and uti. PTA lived at [facility] SNF.     Action/Plan:   Return to SNF when stable   Anticipated DC Date:  11/22/2013   Anticipated DC Plan:  SKILLED NURSING FACILITY  In-house referral  Clinical Social Worker      DC Planning Services  CM consult      Choice offered to / List presented to:             Status of service:  Completed, signed off Medicare Important Message given?  YES (If response is "NO", the following Medicare IM given date fields will be blank) Date Medicare IM given:  11/22/2013 Medicare IM given by:  [facility] Date Additional Medicare IM given:   Additional Medicare IM given by:    Discharge Disposition:  SKILLED NURSING FACILITY  Per UR Regulation:  Reviewed for med. necessity/level of care/duration of stay  If discussed at Long Length of Stay Meetings, dates discussed:    Comments:

## 2013-11-22 NOTE — Progress Notes (Signed)
Clinical Social Work  MD reports DC is held for today. CSW updated SNF who remains agreeable to accept when stable.  [place], [place] [phone]

## 2013-11-22 NOTE — Progress Notes (Signed)
Speech Language Pathology Treatment: Dysphagia  Patient Details Name: Kristi Perry MRN: [id number] DOB: Mar 26, 1965 Today's Date: 11/22/2013 Time: 1135-1203 SLP Time Calculation (min): 28 min  Assessment / Plan / Recommendation Clinical Impression  Pt appears appropriate to begin conservative diet of puree and nectar thick liquids. No overt s/s aspiration observed with thin, nectar, or puree consistencies, however, silent aspiration cannot be detected at bedside. Pt is at high aspiration risk due to significant impulsivity with liquids, cognitive impairment, history of dysphagia and history of aspiration pneumonia. Objective study would be beneficial to formally assess swallow function and safety, however, MBS likely not feasible due to body habitus. FEES would be appropriate to objectively assess swallow, and could be scheduled as an outpatient, given DC possibly today per RN. If pt does DC to SNF today, recommend ST to continue to follow patient for diet tolerance and readiness to advance. Safe swallow precautions provided, with strict adherence recommended.   HPI HPI: 48 yo female with h/o cerebral palsy, aspiration pna Jan 2012, dysphagia (no ST documentation found, pt. states she was at [facility]), epilepsy, hiatal hernia.  Per MD note pt. hypoxic on arrival.  Pt. reported consuming thickened liquids in the past.  CXR Mild patchy bibasilar airspace disease could represent pneumonia, edema, or aspiration pneumonitis.  BSE completed 11/21/13, with recommendation for NPO status. ST follow up today to determine po readiness.   Pertinent Vitals Pain Assessment: No/denies pain  SLP Plan  Continue with current plan of care    Recommendations Diet recommendations: Dysphagia 1 (puree);Nectar-thick liquid Liquids provided via: Cup;Straw Medication Administration: Crushed with puree Supervision: Staff to assist with self feeding Compensations: Slow rate;Small sips/bites Postural Changes and/or Swallow  Maneuvers: Upright 30-60 min after meal;Seated upright 90 degrees              Oral Care Recommendations: Oral care BID Follow up Recommendations: 24 hour supervision/assistance Plan: Continue with current plan of care    GO   Celia B. Murvin [facility], CCC-SLP [phone]  Leigh Aurora 11/22/2013, 12:03 PM

## 2013-11-22 NOTE — Discharge Instructions (Signed)

## 2013-11-23 MED ORDER — HALOPERIDOL LACTATE 5 MG/ML IJ SOLN
2.5000 mg | Freq: Four times a day (QID) | INTRAMUSCULAR | Status: DC | PRN
  Administered 2013-11-23: 2.5 mg via INTRAVENOUS
  Filled 2013-11-23: qty 1

## 2013-11-23 MED ORDER — LORAZEPAM 2 MG/ML IJ SOLN
1.0000 mg | Freq: Once | INTRAMUSCULAR | Status: AC
  Administered 2013-11-23: 1 mg via INTRAVENOUS
  Filled 2013-11-23: qty 1

## 2013-11-23 MED ORDER — HALOPERIDOL LACTATE 5 MG/ML IJ SOLN
2.5000 mg | Freq: Once | INTRAMUSCULAR | Status: AC
  Administered 2013-11-23: 2.5 mg via INTRAVENOUS
  Filled 2013-11-23: qty 1

## 2013-11-23 NOTE — Progress Notes (Signed)
Report called to Fleet Contras at [facility]

## 2013-11-23 NOTE — Progress Notes (Signed)
Pt seen and examined at bedside. Pt medically stable for discharge to SNF today. [place] [facility] [phone]

## 2013-11-23 NOTE — Progress Notes (Signed)
Clinical Social Work  Father returned CSW phone call, CSW made father aware of patient's transport to [facility]. Father was thankful for the information and reports no further concern.   [place], [place]

## 2013-11-23 NOTE — Progress Notes (Signed)
Clinical Social Work Patient stable for DC. CSW faxed DC summary to SNF who is agreeable to accept today. Patient unable to fully communicate. CSW left message with patient's father Sanaz Scarlett [phone]). DC packet prepared with DC summary, FL2, hard scripts, and DNR form included. RN to call report to SNF. Transportation arranged via [facility] #: [id number].  CSW signing off, available if needed.  [place] [phone]

## 2013-11-26 LAB — CULTURE, BLOOD (ROUTINE X 2)
CULTURE: NO GROWTH
Culture: NO GROWTH

## 2014-03-29 IMAGING — CT CT ABD-PELV W/O CM
2 series · 13 of 41 positions shown, 19 images · non-contrast
Comparison: Abdominal pelvic CT 08/20/2011.

CLINICAL DATA: Follow-up percutaneous nephrolithotomy 10/07/2011.
Right abdominal and flank pain with hematuria.

CT ABDOMEN AND PELVIS WITHOUT CONTRAST
TECHNIQUE: Multidetector CT imaging of the abdomen and pelvis was
performed following the standard protocol without intravenous
contrast.

[Series 602: <mpr thick range> · coronal · 0.87mm/px · 12 of 91 slices shown, 17 images]
[im 7/91  soft-tissue]
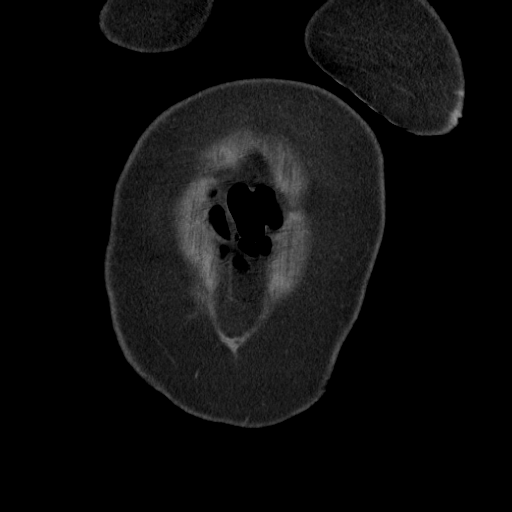
[im 7/91  lung]
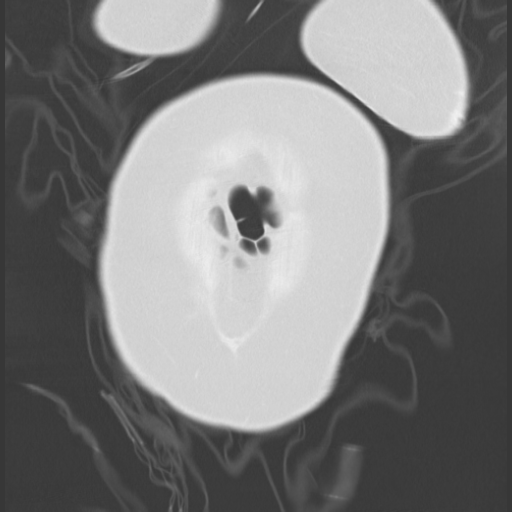
[im 7/91  bone]
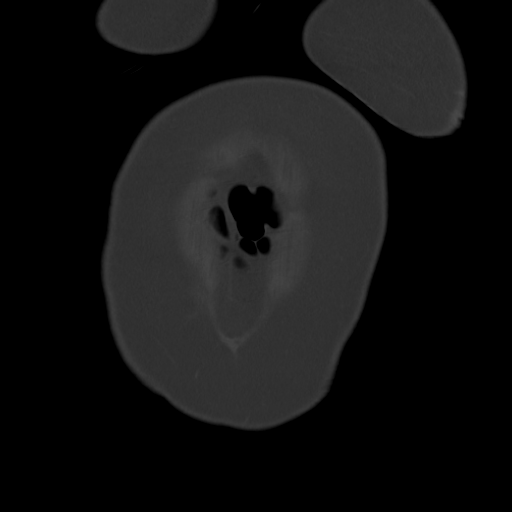
[im 14/91  soft-tissue]
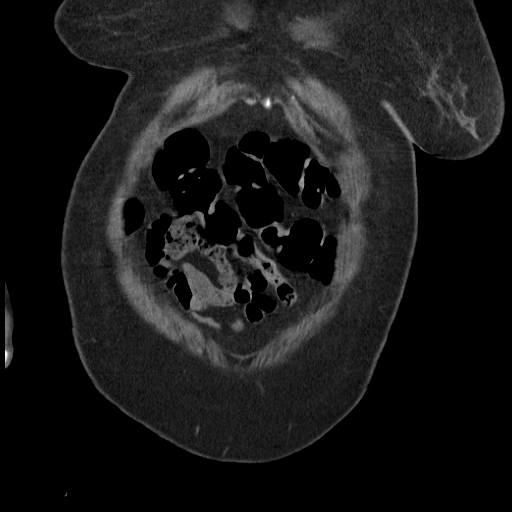
[im 14/91  lung]
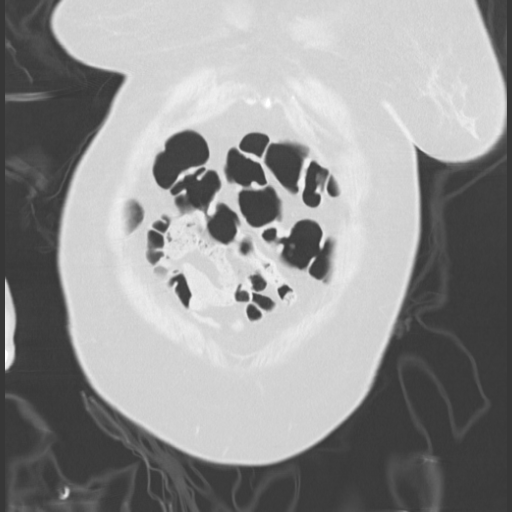
[im 21/91  lung]
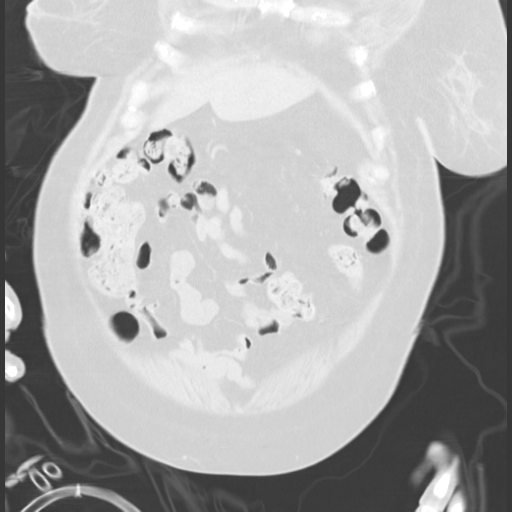
[im 28/91  soft-tissue]
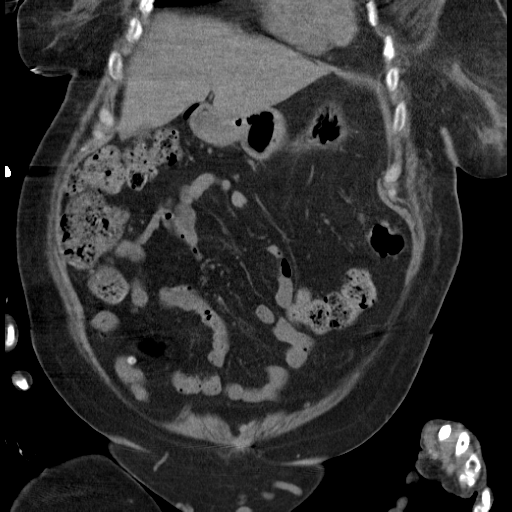
[im 28/91  lung]
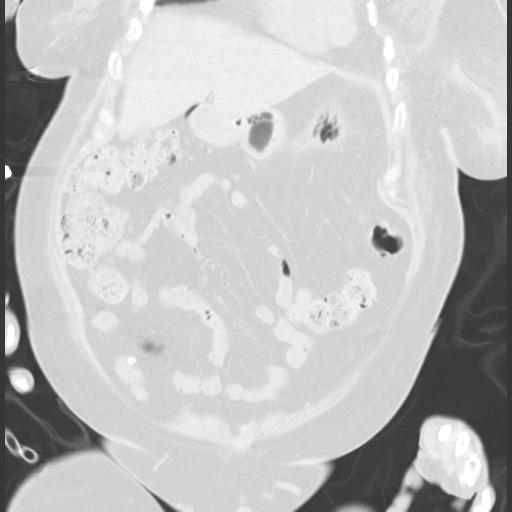
[im 35/91  soft-tissue]
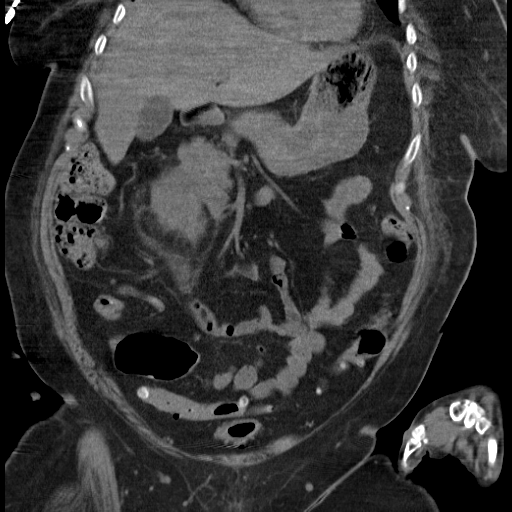
[im 41/91  soft-tissue]
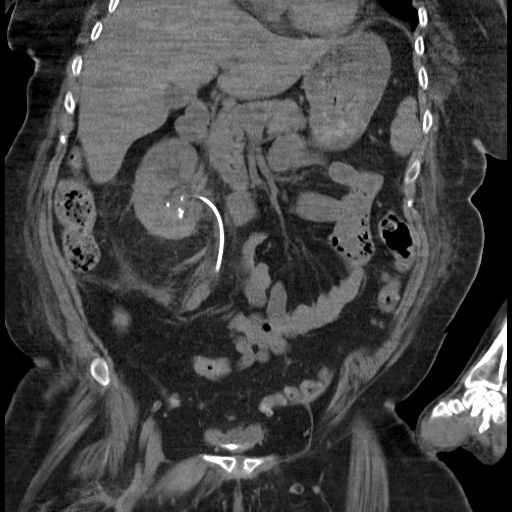
[im 49/91  soft-tissue]
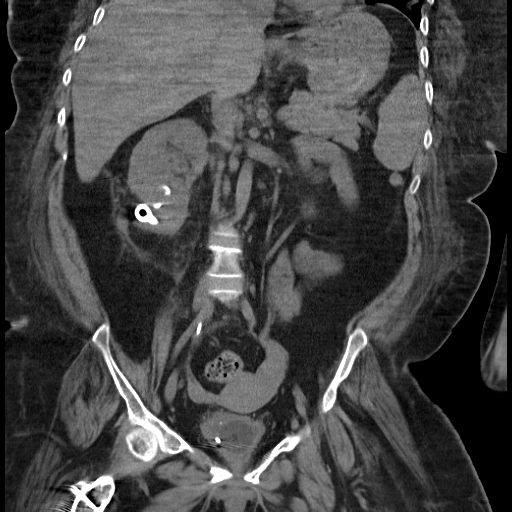
[im 56/91  soft-tissue]
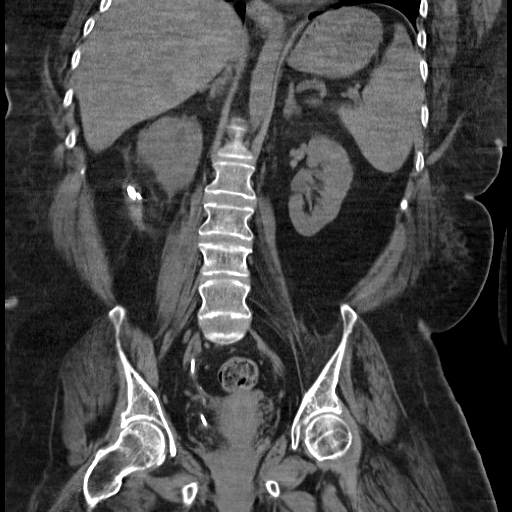
[im 61/91  soft-tissue]
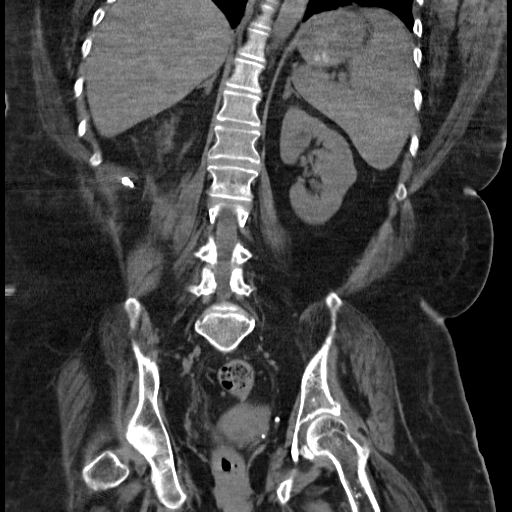
[im 70/91  soft-tissue]
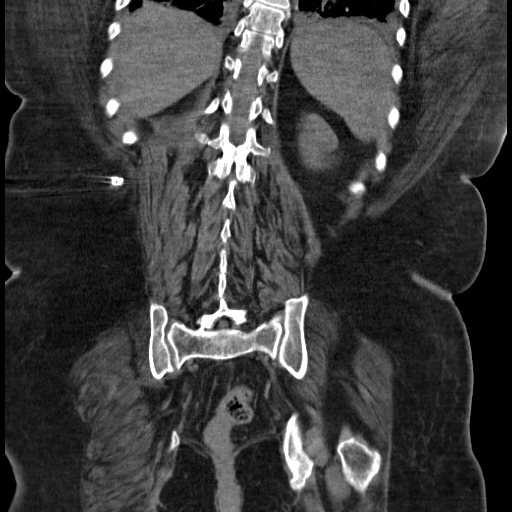
[im 70/91  bone]
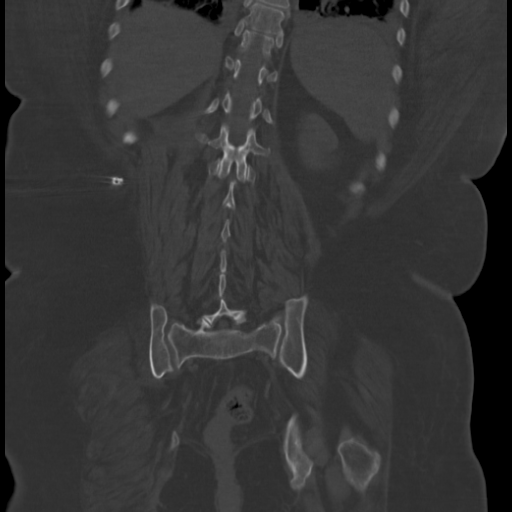
[im 77/91  soft-tissue]
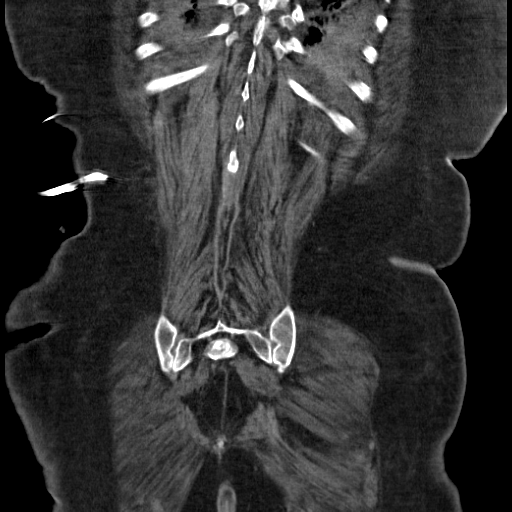
[im 84/91  soft-tissue]
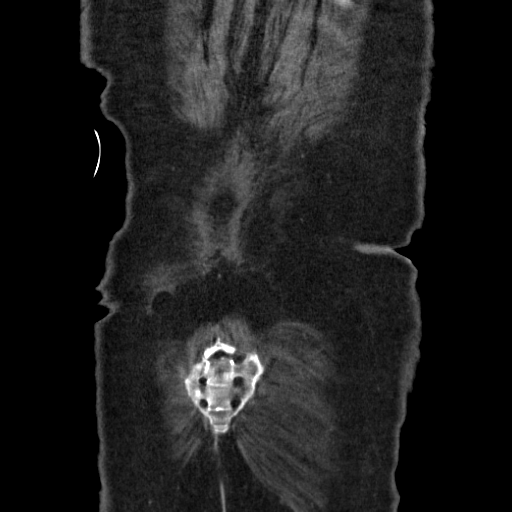

[Series 603: <mpr thick range(1)> · sagittal · 0.87mm/px · 1 of 122 slices shown, 2 images]
[im 57/122  soft-tissue]
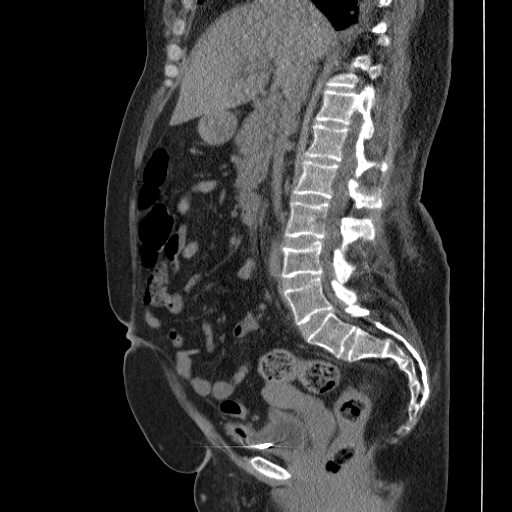
[im 57/122  bone]
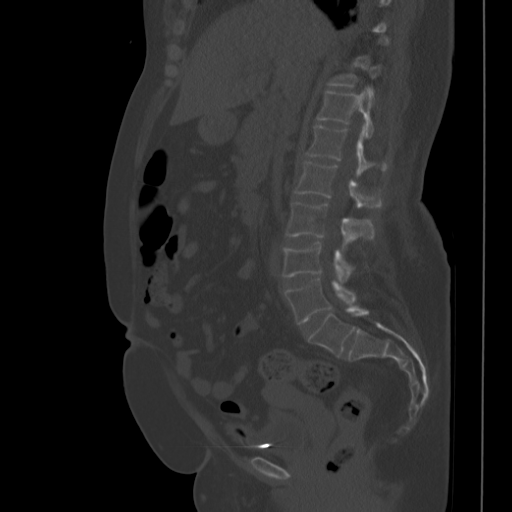

[13 of 41 positions shown; findings below may reference images not displayed]

FINDINGS: There are small bilateral pleural effusions and bibasilar
atelectasis.

The small caliber percutaneous right nephroureteral catheter is
well positioned, extending into the urinary bladder.  The large
caliber nephrostomy tube is positioned along the inferior aspect of
the right kidney with its tip outside the collecting system, likely
in the perinephric space. There is a small amount of air and fluid
within the inferior right perinephric space.  The previously
demonstrated partial staghorn calculus has been fragmented.  There
are residual fragments within the lower pole calyces and renal
pelvis measuring up to 12 mm in diameter.

There is no significant ureteral dilatation.  There are multiple
small stone fragments within the urinary bladder.  Foley catheter
is in place.

There is no evidence of left renal or ureteral calculus.  There is
no left-sided hydronephrosis.

There is stable hepatic steatosis.  No focal abnormalities are
identified.  As imaged in the noncontrast state, the spleen,
gallbladder, pancreas and adrenal glands appear unremarkable.
There is no evidence of bowel obstruction or retroperitoneal
hemorrhage.  The uterus and adnexa appear normal.  Sigmoid colon
diverticular changes are noted. Superficial pelvic varicosities are
stable.
IMPRESSION: 1.  Interval partial fragmentation of right lower pole partial
staghorn calculus.  There are multiple calculus fragments within
the lower pole calyces, renal pelvis and urinary bladder.
2.  Counsel catheter is positioned in the inferior right
perinephric space.  The nephroureteral catheter appears well
positioned.  No significant residual ureteral dilatation.
3.  Small pleural effusions and mild bibasilar atelectasis.

## 2016-09-28 ENCOUNTER — Emergency Department (HOSPITAL_COMMUNITY): Payer: Medicare Other

## 2016-09-28 ENCOUNTER — Inpatient Hospital Stay (HOSPITAL_COMMUNITY)
Admission: AD | Admit: 2016-09-28 | Discharge: 2016-10-10 | DRG: 871 | Disposition: A | Discharge status: Skilled Nursing Facility | Payer: Medicare Other | Attending: Internal Medicine | Admitting: Internal Medicine

## 2016-09-28 ENCOUNTER — Encounter (HOSPITAL_COMMUNITY): Payer: Self-pay | Admitting: Emergency Medicine

## 2016-09-28 DIAGNOSIS — Z515 Encounter for palliative care: Secondary | ICD-10-CM

## 2016-09-28 DIAGNOSIS — J690 Pneumonitis due to inhalation of food and vomit: Secondary | ICD-10-CM

## 2016-09-28 DIAGNOSIS — Z7951 Long term (current) use of inhaled steroids: Secondary | ICD-10-CM

## 2016-09-28 DIAGNOSIS — Z7189 Other specified counseling: Secondary | ICD-10-CM

## 2016-09-28 DIAGNOSIS — R532 Functional quadriplegia: Secondary | ICD-10-CM | POA: Diagnosis present

## 2016-09-28 DIAGNOSIS — A419 Sepsis, unspecified organism: Principal | ICD-10-CM

## 2016-09-28 DIAGNOSIS — N319 Neuromuscular dysfunction of bladder, unspecified: Secondary | ICD-10-CM | POA: Diagnosis present

## 2016-09-28 DIAGNOSIS — R402122 Coma scale, eyes open, to pain, at arrival to emergency department: Secondary | ICD-10-CM | POA: Diagnosis present

## 2016-09-28 DIAGNOSIS — Z7401 Bed confinement status: Secondary | ICD-10-CM

## 2016-09-28 DIAGNOSIS — J9601 Acute respiratory failure with hypoxia: Secondary | ICD-10-CM | POA: Diagnosis present

## 2016-09-28 DIAGNOSIS — R402212 Coma scale, best verbal response, none, at arrival to emergency department: Secondary | ICD-10-CM | POA: Diagnosis present

## 2016-09-28 DIAGNOSIS — Z86718 Personal history of other venous thrombosis and embolism: Secondary | ICD-10-CM

## 2016-09-28 DIAGNOSIS — R4189 Other symptoms and signs involving cognitive functions and awareness: Secondary | ICD-10-CM

## 2016-09-28 DIAGNOSIS — R633 Feeding difficulties, unspecified: Secondary | ICD-10-CM

## 2016-09-28 DIAGNOSIS — R569 Unspecified convulsions: Secondary | ICD-10-CM | POA: Diagnosis not present

## 2016-09-28 DIAGNOSIS — G894 Chronic pain syndrome: Secondary | ICD-10-CM | POA: Diagnosis present

## 2016-09-28 DIAGNOSIS — Z882 Allergy status to sulfonamides status: Secondary | ICD-10-CM

## 2016-09-28 DIAGNOSIS — R4702 Dysphasia: Secondary | ICD-10-CM | POA: Diagnosis present

## 2016-09-28 DIAGNOSIS — E559 Vitamin D deficiency, unspecified: Secondary | ICD-10-CM | POA: Diagnosis present

## 2016-09-28 DIAGNOSIS — R402342 Coma scale, best motor response, flexion withdrawal, at arrival to emergency department: Secondary | ICD-10-CM | POA: Diagnosis present

## 2016-09-28 DIAGNOSIS — R509 Fever, unspecified: Secondary | ICD-10-CM

## 2016-09-28 DIAGNOSIS — G40919 Epilepsy, unspecified, intractable, without status epilepticus: Secondary | ICD-10-CM | POA: Diagnosis present

## 2016-09-28 DIAGNOSIS — F419 Anxiety disorder, unspecified: Secondary | ICD-10-CM | POA: Diagnosis present

## 2016-09-28 DIAGNOSIS — Z66 Do not resuscitate: Secondary | ICD-10-CM | POA: Diagnosis present

## 2016-09-28 DIAGNOSIS — Z6837 Body mass index (BMI) 37.0-37.9, adult: Secondary | ICD-10-CM

## 2016-09-28 DIAGNOSIS — J189 Pneumonia, unspecified organism: Secondary | ICD-10-CM

## 2016-09-28 DIAGNOSIS — Z23 Encounter for immunization: Secondary | ICD-10-CM

## 2016-09-28 DIAGNOSIS — J449 Chronic obstructive pulmonary disease, unspecified: Secondary | ICD-10-CM | POA: Diagnosis present

## 2016-09-28 DIAGNOSIS — Z1612 Extended spectrum beta lactamase (ESBL) resistance: Secondary | ICD-10-CM | POA: Diagnosis present

## 2016-09-28 DIAGNOSIS — G40909 Epilepsy, unspecified, not intractable, without status epilepticus: Secondary | ICD-10-CM | POA: Diagnosis present

## 2016-09-28 DIAGNOSIS — K449 Diaphragmatic hernia without obstruction or gangrene: Secondary | ICD-10-CM | POA: Diagnosis present

## 2016-09-28 DIAGNOSIS — B9620 Unspecified Escherichia coli [E. coli] as the cause of diseases classified elsewhere: Secondary | ICD-10-CM | POA: Diagnosis present

## 2016-09-28 DIAGNOSIS — E039 Hypothyroidism, unspecified: Secondary | ICD-10-CM | POA: Diagnosis present

## 2016-09-28 DIAGNOSIS — E785 Hyperlipidemia, unspecified: Secondary | ICD-10-CM | POA: Diagnosis present

## 2016-09-28 DIAGNOSIS — G809 Cerebral palsy, unspecified: Secondary | ICD-10-CM | POA: Diagnosis present

## 2016-09-28 DIAGNOSIS — N390 Urinary tract infection, site not specified: Secondary | ICD-10-CM

## 2016-09-28 DIAGNOSIS — Z0189 Encounter for other specified special examinations: Secondary | ICD-10-CM

## 2016-09-28 DIAGNOSIS — L89312 Pressure ulcer of right buttock, stage 2: Secondary | ICD-10-CM | POA: Diagnosis present

## 2016-09-28 DIAGNOSIS — R1310 Dysphagia, unspecified: Secondary | ICD-10-CM

## 2016-09-28 DIAGNOSIS — I10 Essential (primary) hypertension: Secondary | ICD-10-CM | POA: Diagnosis present

## 2016-09-28 DIAGNOSIS — G40301 Generalized idiopathic epilepsy and epileptic syndromes, not intractable, with status epilepticus: Secondary | ICD-10-CM

## 2016-09-28 DIAGNOSIS — Z91048 Other nonmedicinal substance allergy status: Secondary | ICD-10-CM

## 2016-09-28 DIAGNOSIS — L8990 Pressure ulcer of unspecified site, unspecified stage: Secondary | ICD-10-CM | POA: Insufficient documentation

## 2016-09-28 HISTORY — DX: Unspecified convulsions: R56.9

## 2016-09-28 LAB — CBG MONITORING, ED: GLUCOSE-CAPILLARY: 217 mg/dL — AB (ref 65–99)

## 2016-09-28 MED ORDER — ACETAMINOPHEN 650 MG RE SUPP
650.0000 mg | Freq: Once | RECTAL | Status: AC
  Administered 2016-09-29: 650 mg via RECTAL
  Filled 2016-09-28: qty 1

## 2016-09-28 NOTE — ED Triage Notes (Signed)
[facility] BIB [facility] for [facility], hx of. Pt given 1mg  of ativan x2 IM at SNF, given 2.5 versed en route IV. Patient seized for approximately 20 minutes prior to versed. Pt diaphoretic and post-ictal. Pt is a DNR.

## 2016-09-28 NOTE — ED Notes (Signed)
Bed: RESA Expected date:  Expected time:  Means of arrival:  Comments: EMS 51 yo female seizure/Ativan 2 mg IM and Versed 2.5 mg

## 2016-09-29 ENCOUNTER — Inpatient Hospital Stay (HOSPITAL_COMMUNITY): Payer: Medicare Other

## 2016-09-29 DIAGNOSIS — E559 Vitamin D deficiency, unspecified: Secondary | ICD-10-CM | POA: Diagnosis present

## 2016-09-29 DIAGNOSIS — J9601 Acute respiratory failure with hypoxia: Secondary | ICD-10-CM

## 2016-09-29 DIAGNOSIS — G809 Cerebral palsy, unspecified: Secondary | ICD-10-CM | POA: Diagnosis present

## 2016-09-29 DIAGNOSIS — Z66 Do not resuscitate: Secondary | ICD-10-CM | POA: Diagnosis present

## 2016-09-29 DIAGNOSIS — J189 Pneumonia, unspecified organism: Secondary | ICD-10-CM

## 2016-09-29 DIAGNOSIS — E039 Hypothyroidism, unspecified: Secondary | ICD-10-CM | POA: Diagnosis present

## 2016-09-29 DIAGNOSIS — R532 Functional quadriplegia: Secondary | ICD-10-CM | POA: Diagnosis present

## 2016-09-29 DIAGNOSIS — R402212 Coma scale, best verbal response, none, at arrival to emergency department: Secondary | ICD-10-CM | POA: Diagnosis present

## 2016-09-29 DIAGNOSIS — G894 Chronic pain syndrome: Secondary | ICD-10-CM | POA: Diagnosis present

## 2016-09-29 DIAGNOSIS — R569 Unspecified convulsions: Secondary | ICD-10-CM

## 2016-09-29 DIAGNOSIS — N390 Urinary tract infection, site not specified: Secondary | ICD-10-CM

## 2016-09-29 DIAGNOSIS — R1310 Dysphagia, unspecified: Secondary | ICD-10-CM | POA: Diagnosis present

## 2016-09-29 DIAGNOSIS — R633 Feeding difficulties: Secondary | ICD-10-CM | POA: Diagnosis not present

## 2016-09-29 DIAGNOSIS — G40401 Other generalized epilepsy and epileptic syndromes, not intractable, with status epilepticus: Secondary | ICD-10-CM | POA: Diagnosis not present

## 2016-09-29 DIAGNOSIS — K449 Diaphragmatic hernia without obstruction or gangrene: Secondary | ICD-10-CM | POA: Diagnosis present

## 2016-09-29 DIAGNOSIS — Z23 Encounter for immunization: Secondary | ICD-10-CM | POA: Diagnosis not present

## 2016-09-29 DIAGNOSIS — J690 Pneumonitis due to inhalation of food and vomit: Secondary | ICD-10-CM | POA: Diagnosis present

## 2016-09-29 DIAGNOSIS — R402122 Coma scale, eyes open, to pain, at arrival to emergency department: Secondary | ICD-10-CM | POA: Diagnosis present

## 2016-09-29 DIAGNOSIS — A4151 Sepsis due to Escherichia coli [E. coli]: Secondary | ICD-10-CM | POA: Diagnosis not present

## 2016-09-29 DIAGNOSIS — G40919 Epilepsy, unspecified, intractable, without status epilepticus: Secondary | ICD-10-CM | POA: Diagnosis present

## 2016-09-29 DIAGNOSIS — Z0189 Encounter for other specified special examinations: Secondary | ICD-10-CM | POA: Diagnosis not present

## 2016-09-29 DIAGNOSIS — G40909 Epilepsy, unspecified, not intractable, without status epilepticus: Secondary | ICD-10-CM | POA: Diagnosis not present

## 2016-09-29 DIAGNOSIS — A419 Sepsis, unspecified organism: Secondary | ICD-10-CM | POA: Diagnosis present

## 2016-09-29 DIAGNOSIS — R402342 Coma scale, best motor response, flexion withdrawal, at arrival to emergency department: Secondary | ICD-10-CM | POA: Diagnosis present

## 2016-09-29 DIAGNOSIS — I10 Essential (primary) hypertension: Secondary | ICD-10-CM | POA: Diagnosis present

## 2016-09-29 DIAGNOSIS — Z7189 Other specified counseling: Secondary | ICD-10-CM | POA: Diagnosis not present

## 2016-09-29 DIAGNOSIS — R4189 Other symptoms and signs involving cognitive functions and awareness: Secondary | ICD-10-CM | POA: Diagnosis not present

## 2016-09-29 DIAGNOSIS — Z86718 Personal history of other venous thrombosis and embolism: Secondary | ICD-10-CM | POA: Diagnosis not present

## 2016-09-29 DIAGNOSIS — L89312 Pressure ulcer of right buttock, stage 2: Secondary | ICD-10-CM | POA: Diagnosis present

## 2016-09-29 DIAGNOSIS — N319 Neuromuscular dysfunction of bladder, unspecified: Secondary | ICD-10-CM | POA: Diagnosis present

## 2016-09-29 DIAGNOSIS — G40301 Generalized idiopathic epilepsy and epileptic syndromes, not intractable, with status epilepticus: Secondary | ICD-10-CM | POA: Diagnosis not present

## 2016-09-29 DIAGNOSIS — E785 Hyperlipidemia, unspecified: Secondary | ICD-10-CM | POA: Diagnosis present

## 2016-09-29 DIAGNOSIS — Z515 Encounter for palliative care: Secondary | ICD-10-CM | POA: Diagnosis not present

## 2016-09-29 DIAGNOSIS — R4702 Dysphasia: Secondary | ICD-10-CM | POA: Diagnosis present

## 2016-09-29 DIAGNOSIS — J9600 Acute respiratory failure, unspecified whether with hypoxia or hypercapnia: Secondary | ICD-10-CM | POA: Diagnosis not present

## 2016-09-29 LAB — CBC WITH DIFFERENTIAL/PLATELET
Basophils Absolute: 0.1 10*3/uL (ref 0.0–0.1)
Basophils Relative: 0 %
EOS ABS: 0.2 10*3/uL (ref 0.0–0.7)
Eosinophils Relative: 1 %
HCT: 50 % — ABNORMAL HIGH (ref 36.0–46.0)
HEMOGLOBIN: 17.2 g/dL — AB (ref 12.0–15.0)
LYMPHS ABS: 4.2 10*3/uL — AB (ref 0.7–4.0)
LYMPHS PCT: 15 %
MCH: 31.6 pg (ref 26.0–34.0)
MCHC: 34.4 g/dL (ref 30.0–36.0)
MCV: 91.7 fL (ref 78.0–100.0)
MONOS PCT: 6 %
Monocytes Absolute: 1.7 10*3/uL — ABNORMAL HIGH (ref 0.1–1.0)
NEUTROS PCT: 78 %
Neutro Abs: 21.3 10*3/uL — ABNORMAL HIGH (ref 1.7–7.7)
Platelets: 365 10*3/uL (ref 150–400)
RBC: 5.45 MIL/uL — ABNORMAL HIGH (ref 3.87–5.11)
RDW: 13.5 % (ref 11.5–15.5)
WBC: 27.4 10*3/uL — ABNORMAL HIGH (ref 4.0–10.5)

## 2016-09-29 LAB — URINALYSIS, ROUTINE W REFLEX MICROSCOPIC
BILIRUBIN URINE: NEGATIVE
Glucose, UA: 50 mg/dL — AB
Ketones, ur: NEGATIVE mg/dL
NITRITE: NEGATIVE
PH: 5 (ref 5.0–8.0)
Protein, ur: 100 mg/dL — AB
SPECIFIC GRAVITY, URINE: 1.012 (ref 1.005–1.030)

## 2016-09-29 LAB — LACTIC ACID, PLASMA: Lactic Acid, Venous: 1 mmol/L (ref 0.5–1.9)

## 2016-09-29 LAB — COMPREHENSIVE METABOLIC PANEL
ALBUMIN: 4.3 g/dL (ref 3.5–5.0)
ALK PHOS: 130 U/L — AB (ref 38–126)
ALT: 25 U/L (ref 14–54)
AST: 39 U/L (ref 15–41)
Anion gap: 16 — ABNORMAL HIGH (ref 5–15)
BILIRUBIN TOTAL: 0.6 mg/dL (ref 0.3–1.2)
BUN: 12 mg/dL (ref 6–20)
CALCIUM: 9.5 mg/dL (ref 8.9–10.3)
CO2: 25 mmol/L (ref 22–32)
Chloride: 102 mmol/L (ref 101–111)
Creatinine, Ser: 0.94 mg/dL (ref 0.44–1.00)
GFR calc Af Amer: >60 mL/min (ref >60)
GFR calc non Af Amer: >60 mL/min (ref >60)
GLUCOSE: 222 mg/dL — AB (ref 65–99)
POTASSIUM: 2.8 mmol/L — AB (ref 3.5–5.1)
Sodium: 143 mmol/L (ref 135–145)
TOTAL PROTEIN: 7.5 g/dL (ref 6.5–8.1)

## 2016-09-29 LAB — CBC
HEMATOCRIT: 49 % — AB (ref 36.0–46.0)
Hemoglobin: 16.5 g/dL — ABNORMAL HIGH (ref 12.0–15.0)
MCH: 32 pg (ref 26.0–34.0)
MCHC: 33.7 g/dL (ref 30.0–36.0)
MCV: 95 fL (ref 78.0–100.0)
PLATELETS: 239 10*3/uL (ref 150–400)
RBC: 5.16 MIL/uL — AB (ref 3.87–5.11)
RDW: 13.9 % (ref 11.5–15.5)
WBC: 28.6 10*3/uL — AB (ref 4.0–10.5)

## 2016-09-29 LAB — BASIC METABOLIC PANEL
ANION GAP: 7 (ref 5–15)
BUN: 8 mg/dL (ref 6–20)
CHLORIDE: 107 mmol/L (ref 101–111)
CO2: 29 mmol/L (ref 22–32)
Calcium: 7.1 mg/dL — ABNORMAL LOW (ref 8.9–10.3)
Creatinine, Ser: 0.67 mg/dL (ref 0.44–1.00)
GFR calc Af Amer: >60 mL/min (ref >60)
GLUCOSE: 160 mg/dL — AB (ref 65–99)
POTASSIUM: 3.6 mmol/L (ref 3.5–5.1)
Sodium: 143 mmol/L (ref 135–145)

## 2016-09-29 LAB — PROTIME-INR
INR: 1.39
PROTHROMBIN TIME: 17.2 s — AB (ref 11.4–15.2)

## 2016-09-29 LAB — I-STAT CG4 LACTIC ACID, ED: Lactic Acid, Venous: 5.55 mmol/L (ref 0.5–1.9)

## 2016-09-29 LAB — GLUCOSE, CAPILLARY
GLUCOSE-CAPILLARY: 148 mg/dL — AB (ref 65–99)
GLUCOSE-CAPILLARY: 155 mg/dL — AB (ref 65–99)
GLUCOSE-CAPILLARY: 112 mg/dL — AB (ref 65–99)
Glucose-Capillary: 134 mg/dL — ABNORMAL HIGH (ref 65–99)

## 2016-09-29 LAB — STREP PNEUMONIAE URINARY ANTIGEN: Strep Pneumo Urinary Antigen: NEGATIVE

## 2016-09-29 LAB — I-STAT BETA HCG BLOOD, ED (MC, WL, AP ONLY): I-stat hCG, quantitative: <5 m[IU]/mL (ref <5)

## 2016-09-29 LAB — MRSA PCR SCREENING: MRSA by PCR: NEGATIVE

## 2016-09-29 LAB — HIV ANTIBODY (ROUTINE TESTING W REFLEX): HIV Screen 4th Generation wRfx: NONREACTIVE

## 2016-09-29 LAB — VALPROIC ACID LEVEL: Valproic Acid Lvl: <10 ug/mL — ABNORMAL LOW (ref 50.0–100.0)

## 2016-09-29 MED ORDER — POTASSIUM CHLORIDE 10 MEQ/100ML IV SOLN
10.0000 meq | Freq: Once | INTRAVENOUS | Status: AC
  Administered 2016-09-29: 10 meq via INTRAVENOUS
  Filled 2016-09-29: qty 100

## 2016-09-29 MED ORDER — ACETAMINOPHEN 650 MG RE SUPP
650.0000 mg | Freq: Four times a day (QID) | RECTAL | Status: DC | PRN
  Administered 2016-10-01 – 2016-10-02 (×2): 650 mg via RECTAL
  Filled 2016-09-29 (×2): qty 1

## 2016-09-29 MED ORDER — ENOXAPARIN SODIUM 40 MG/0.4ML ~~LOC~~ SOLN
40.0000 mg | SUBCUTANEOUS | Status: DC
  Administered 2016-09-29 – 2016-10-10 (×12): 40 mg via SUBCUTANEOUS
  Filled 2016-09-29 (×13): qty 0.4

## 2016-09-29 MED ORDER — SODIUM CHLORIDE 0.9 % IV SOLN
500.0000 mg | Freq: Two times a day (BID) | INTRAVENOUS | Status: DC
  Administered 2016-09-29 – 2016-10-01 (×4): 500 mg via INTRAVENOUS
  Filled 2016-09-29 (×6): qty 5

## 2016-09-29 MED ORDER — SODIUM CHLORIDE 0.9 % IV SOLN
1000.0000 mg | Freq: Once | INTRAVENOUS | Status: AC
Start: 2016-09-29 — End: 2016-09-29
  Administered 2016-09-29: 1000 mg via INTRAVENOUS
  Filled 2016-09-29: qty 10

## 2016-09-29 MED ORDER — VALPROATE SODIUM 500 MG/5ML IV SOLN
500.0000 mg | Freq: Three times a day (TID) | INTRAVENOUS | Status: DC
  Administered 2016-09-29 – 2016-10-10 (×34): 500 mg via INTRAVENOUS
  Filled 2016-09-29 (×39): qty 5

## 2016-09-29 MED ORDER — SODIUM CHLORIDE 0.9 % IV BOLUS (SEPSIS)
1000.0000 mL | Freq: Once | INTRAVENOUS | Status: AC
  Administered 2016-09-29: 1000 mL via INTRAVENOUS

## 2016-09-29 MED ORDER — PIPERACILLIN-TAZOBACTAM 3.375 G IVPB
3.3750 g | Freq: Three times a day (TID) | INTRAVENOUS | Status: DC
  Filled 2016-09-29: qty 50

## 2016-09-29 MED ORDER — VANCOMYCIN HCL IN DEXTROSE 1-5 GM/200ML-% IV SOLN
1000.0000 mg | Freq: Once | INTRAVENOUS | Status: AC
  Administered 2016-09-29: 1000 mg via INTRAVENOUS
  Filled 2016-09-29: qty 200

## 2016-09-29 MED ORDER — LORAZEPAM 2 MG/ML IJ SOLN: 2.0000 mg | Freq: Once | INTRAMUSCULAR | Status: DC | PRN

## 2016-09-29 MED ORDER — LIP MEDEX EX OINT
TOPICAL_OINTMENT | CUTANEOUS | Status: AC
  Administered 2016-09-29: 22:00:00
  Filled 2016-09-29: qty 7

## 2016-09-29 MED ORDER — SODIUM CHLORIDE 0.9 % IV SOLN
1.0000 g | Freq: Three times a day (TID) | INTRAVENOUS | Status: DC
  Administered 2016-09-29 – 2016-10-02 (×11): 1 g via INTRAVENOUS
  Filled 2016-09-29 (×13): qty 1

## 2016-09-29 MED ORDER — VALPROATE SODIUM 500 MG/5ML IV SOLN
250.0000 mg | Freq: Three times a day (TID) | INTRAVENOUS | Status: DC
  Filled 2016-09-29: qty 2.5

## 2016-09-29 MED ORDER — PNEUMOCOCCAL VAC POLYVALENT 25 MCG/0.5ML IJ INJ
0.5000 mL | INJECTION | INTRAMUSCULAR | Status: AC
  Administered 2016-09-30: 0.5 mL via INTRAMUSCULAR
  Filled 2016-09-29: qty 0.5

## 2016-09-29 MED ORDER — SODIUM CHLORIDE 0.9 % IV SOLN
INTRAVENOUS | Status: DC
  Administered 2016-09-29 – 2016-10-02 (×5): via INTRAVENOUS

## 2016-09-29 MED ORDER — SODIUM CHLORIDE 0.9 % IV SOLN: 250.0000 mg | Freq: Two times a day (BID) | INTRAVENOUS | Status: DC

## 2016-09-29 MED ORDER — VANCOMYCIN HCL IN DEXTROSE 1-5 GM/200ML-% IV SOLN
1000.0000 mg | Freq: Two times a day (BID) | INTRAVENOUS | Status: DC
  Administered 2016-09-29 – 2016-09-30 (×2): 1000 mg via INTRAVENOUS
  Filled 2016-09-29 (×2): qty 200

## 2016-09-29 MED ORDER — PIPERACILLIN-TAZOBACTAM 3.375 G IVPB 30 MIN
3.3750 g | Freq: Once | INTRAVENOUS | Status: AC
  Administered 2016-09-29: 3.375 g via INTRAVENOUS
  Filled 2016-09-29: qty 50

## 2016-09-29 NOTE — Progress Notes (Signed)
Patient seen and examined this morning, admitted overnight by Dr. Julian ReilGardner. H&P reviewed, agree with the assessment and plan.  In brief, this is a 51 year old female with history of cerebral palsy, seizures, aspiration pneumonia who came in after prolonged diminished lung seizure at the SNF. She is unresponsive currently.   Post ictal coma and seizures -Seizure activity appears to have stopped. Continue Keppra and Depacon -She is comatose currently -DO NOT RESUSCITATE confirmed by mother to the admitting M.D., very poor prognosis. Unable to reach mother this morning, will attempt throughout the day   HCAP causing acute respiratory failure with hypoxia -Broad-spectrum antibiotics for now  Underlying cerebral palsy, bed bound state, functional quadriplegia, and seizure disorder -guarded prognosis   Ramandeep Arington M. Elvera LennoxGherghe, MD [facility] [phone]  If 7PM-7AM, please contact night-coverage [email or web address] Password [email or web address]

## 2016-09-29 NOTE — ED Notes (Signed)
Attempted to call report on patient, nurse to call back.

## 2016-09-29 NOTE — H&P (Signed)
History and Physical    Kristi LabradorWendy R Badilla MVH:[id number] DOB: 02/20/1966 DOA: 09/28/2016  PCP: Angela Coxasanayaka, [name], MD  [facility]  I have personally briefly reviewed patient[facility]  Chief Complaint: Seizures  HPI: Kristi HawthorneWendy R Perry is a 51 y.o. female with medical history significant of seizures, CP, chronic pain syndrome, PNA and aspiration PNA.  Patient brought in to ED after apparent 20 min seizure at SNF.  Was given 2mg  ativan at SNF which didn't stop seizure.  Was given 2.5mg  versed by EMS which did stop seizure.  Patient is post-ictal and essentially unresponsive in ED and not able to contribute to history.   ED Course: Tm 102.3.  Tachycardic to 130s initially which improves to 105 after IVF.  Was sating mid 5880s this improves with O2 via NRB, now sating 98% on NRB.  WBC 27.4k.  Lactate was obtained and was 5.55 (not sure if due to sepsis or seizure though.  UA shows UTI, CXR shows LLL PNA.   Review of Systems: Unable to perform on this comatose patient.  Past Medical History:  Diagnosis Date  . Acute respiratory failure (HCC)   . Allergic rhinitis   . Anxiety   . Cerebral palsy (HCC)   . Chronic airway obstruction (HCC)   . Chronic pain syndrome   . Depression   . DVT (deep vein thrombosis) in pregnancy (HCC)   . Dysphagia   . Dysphasia   . Epilepsy (HCC)   . Hiatal hernia   . Hyperlipidemia   . Hypertension   . Hypothyroidism   . Incontinent of feces 09-25-11   hx.  . Incontinent of urine 09-25-11   hx.  . Migraine   . Mild intellectual disabilities   . Morbid obesity (HCC) 09-25-11   requires Hoyer lift. pt. doesn't stand or ambulate.  . Muscle weakness   . Nephrolithiasis 04/05/2012  . Neurogenic bladder   . Pneumonia    hx of asp pneumonia 1/11-1/19/12  . Sacral decubitus ulcer    hx of  . Seizures (HCC)   . Sepsis(995.91)   . Sepsis(995.91)    hx of   . Unspecified psychosis   .  Vitamin D deficiency     Past Surgical History:  Procedure Laterality Date  . CYSTOSCOPY WITH URETEROSCOPY  11/07/2011   Procedure: CYSTOSCOPY WITH URETEROSCOPY;  Surgeon: Marcine MatarStephen Dahlstedt, MD;  Location: [facility];  Service: Urology;  Laterality: Right;  Removal of right double J stent, Insertion right double J stent  . NEPHROLITHOTOMY  10/07/2011   Procedure: NEPHROLITHOTOMY PERCUTANEOUS;  Surgeon: Marcine MatarStephen Dahlstedt, MD;  Location: [facility];  Service: Urology;  Laterality: Right;  kidney   . NEPHROLITHOTOMY  12/06/2011   Procedure: NEPHROLITHOTOMY PERCUTANEOUS;  Surgeon: Marcine MatarStephen Dahlstedt, MD;  Location: [facility];  Service: Urology;  Laterality: Right;   REPEAT PCNL   . STONE EXTRACTION WITH BASKET  11/07/2011   Procedure: STONE EXTRACTION WITH BASKET;  Surgeon: Marcine MatarStephen Dahlstedt, MD;  Location: [facility];  Service: Urology;  Laterality: Right;     reports that she has never smoked. She has never used smokeless tobacco. She reports that she does not drink alcohol or use drugs.  Allergies  Allergen Reactions  . Sulfa Antibiotics Hives  . Tape Rash    Paper tape    History reviewed. No pertinent family history. Family history unknown, patient unable to provide any history, patient is comatose with GCS of 3.  Prior to  Admission medications   Medication Sig Start Date End Date Taking? Authorizing Provider  acetaminophen (TYLENOL) 325 MG tablet Take 650 mg by mouth every 4 (four) hours as needed for mild pain or moderate pain.   Yes [provider]  albuterol (PROVENTIL HFA;VENTOLIN HFA) 108 (90 BASE) MCG/ACT inhaler Inhale 2 puffs into the lungs every 4 (four) hours as needed for wheezing or shortness of breath. 04/07/12  Yes Short, Thea Silversmith, MD  albuterol (PROVENTIL) (2.5 MG/3ML) 0.083% nebulizer solution Take 2.5 mg by nebulization every 8 (eight) hours as needed for wheezing or shortness of breath.   Yes [provider]  antiseptic oral rinse (BIOTENE) LIQD 15 mLs by Mouth Rinse  route as needed for dry mouth.   Yes [provider]  arformoterol (BROVANA) 15 MCG/2ML NEBU Take 15 mcg by nebulization 2 (two) times daily.   Yes [provider]  baclofen (LIORESAL) 10 MG tablet Take 10 mg by mouth 4 (four) times daily.   Yes [provider]  budesonide (PULMICORT) 0.25 MG/2ML nebulizer solution Take 0.25 mg by nebulization 2 (two) times daily as needed (SOB, wheezing).    Yes [provider]  clonazePAM (KLONOPIN) 1 MG tablet Take 1 tablet (1 mg total) by mouth 2 (two) times daily. Patient taking differently: Take 0.5 mg by mouth daily after breakfast.  11/22/13  Yes Alison Murray, MD  cloZAPine (CLOZARIL) 100 MG tablet Take 75 mg by mouth daily.   Yes [provider]  divalproex (DEPAKOTE) 250 MG DR tablet Take 750 mg by mouth at bedtime.   Yes [provider]  fentaNYL (DURAGESIC - DOSED MCG/HR) 75 MCG/HR Place 1 patch (75 mcg total) onto the skin every 3 (three) days. 11/22/13  Yes Alison Murray, MD  fexofenadine-pseudoephedrine (ALLEGRA-D) 60-120 MG 12 hr tablet Take 1 tablet by mouth daily.   Yes [provider]  fluticasone (FLONASE) 50 MCG/ACT nasal spray Place 2 sprays into both nostrils daily.   Yes [provider]  furosemide (LASIX) 20 MG tablet Take 20 mg by mouth daily.   Yes [provider]  HYDROcodone-acetaminophen (NORCO/VICODIN) 5-325 MG per tablet Take 1 tablet by mouth every 6 (six) hours as needed for moderate pain. 11/22/13  Yes Alison Murray, MD  ketotifen (ZADITOR) 0.025 % ophthalmic solution Place 1 drop into both eyes 2 (two) times daily.   Yes [provider]  lamoTRIgine (LAMICTAL) 100 MG tablet Take 100 mg by mouth 2 (two) times daily.    Yes [provider]  levETIRAcetam (KEPPRA) 250 MG tablet Take 250 mg by mouth every 12 (twelve) hours.   Yes [provider]  Melatonin-Pyridoxine (MELATIN PO) Take 3 mg by mouth at bedtime.    Yes [provider]  montelukast (SINGULAIR) 10 MG tablet Take 10 mg by mouth at bedtime.    Yes [provider]  potassium chloride (K-DUR) 10 MEQ tablet Take 10 mEq by mouth 3 (three) times daily.    Yes [provider]  pravastatin (PRAVACHOL) 40 MG tablet Take 40 mg by mouth daily.    Yes [provider]  risperidone (RISPERDAL) 4 MG tablet Take 4 mg by mouth at bedtime.   Yes [provider]  senna (SENOKOT) 8.6 MG TABS tablet Take 2 tablets by mouth daily.   Yes [provider]  sodium chloride (OCEAN) 0.65 % nasal spray Place 1 spray into the nose 2 (two) times daily.   Yes [provider]  venlafaxine  XR (EFFEXOR-XR) 150 MG 24 hr capsule Take 1 capsule (150 mg total) by mouth daily with breakfast. 11/22/13  Yes Alison Murrayevine, Alma M, MD  venlafaxine XR (EFFEXOR-XR) 75 MG 24 hr capsule Take 75 mg by mouth daily with breakfast.   Yes [provider]  vitamin C (ASCORBIC ACID) 500 MG tablet Take 500 mg by mouth 2 (two) times daily.   Yes [provider]  Vitamin D, Ergocalciferol, (DRISDOL) 50000 UNITS CAPS capsule Take 50,000 Units by mouth every 7 (seven) days.   Yes [provider]  LORazepam (ATIVAN) 2 MG/ML concentrated solution Take 0.5 mLs (1 mg total) by mouth every 8 (eight) hours as needed for anxiety. Patient not taking: Reported on 09/29/2016 11/22/13   Alison Murrayevine, Alma M, MD  risperiDONE (RISPERDAL) 2 MG tablet Take 1 tablet (2 mg total) by mouth at bedtime. Patient not taking: Reported on 09/29/2016 11/22/13   Alison Murrayevine, Alma M, MD    Physical Exam: Vitals:   09/29/16 0120 09/29/16 0130 09/29/16 0200 09/29/16 0230  BP:  (!) 146/72 (!) 142/61 132/64  Pulse:  (!) 122 (!) 112 (!) 105  Resp:  19 (!) 25 19  Temp:      TempSrc:      SpO2:  95% 97% 96%  Weight: 81.6 kg (180 lb)       Constitutional: Comatose with GCS of 3 Eyes: PERRL, lids and conjunctivae normal ENMT: Mucous membranes are moist. Posterior pharynx  clear of any exudate or lesions.Normal dentition.  Neck: normal, supple, no masses, no thyromegaly Respiratory: clear to auscultation bilaterally, no wheezing, no crackles. Normal respiratory effort. No accessory muscle use.  Cardiovascular: Mild Tachycardia, no murmurs / rubs / gallops. No extremity edema. 2+ pedal pulses. No carotid bruits.  Abdomen: no tenderness, no masses palpated. No hepatosplenomegaly. Bowel sounds positive.  Musculoskeletal: no clubbing / cyanosis. No joint deformity upper and lower extremities. Skin: no rashes, lesions, ulcers. No induration Neurologic: GCS 3 Psychiatric: Comatose with GCS 3   Labs on Admission: I have personally reviewed following labs and imaging studies  CBC:  Recent Labs Lab 09/29/16 0029  WBC 27.4*  NEUTROABS 21.3*  HGB 17.2*  HCT 50.0*  MCV 91.7  PLT 365   Basic Metabolic Panel:  Recent Labs Lab 09/29/16 0029  NA 143  K 2.8*  CL 102  CO2 25  GLUCOSE 222*  BUN 12  CREATININE 0.94  CALCIUM 9.5   GFR: CrCl cannot be calculated (Unknown ideal weight.). Liver Function Tests:  Recent Labs Lab 09/29/16 0029  AST 39  ALT 25  ALKPHOS 130*  BILITOT 0.6  PROT 7.5  ALBUMIN 4.3   No results for input(s): LIPASE, AMYLASE in the last 168 hours. No results for input(s): AMMONIA in the last 168 hours. Coagulation Profile:  Recent Labs Lab 09/29/16 0029  INR 1.39   Cardiac Enzymes: No results for input(s): CKTOTAL, CKMB, CKMBINDEX, TROPONINI in the last 168 hours. BNP (last 3 results) No results for input(s): PROBNP in the last 8760 hours. HbA1C: No results for input(s): HGBA1C in the last 72 hours. CBG:  Recent Labs Lab 09/28/16 2340  GLUCAP 217*   Lipid Profile: No results for input(s): CHOL, HDL, LDLCALC, TRIG, CHOLHDL, LDLDIRECT in the last 72 hours. Thyroid Function Tests: No results for input(s): TSH, T4TOTAL, FREET4, T3FREE, THYROIDAB in the last 72 hours. Anemia Panel: No results for input(s):  VITAMINB12, FOLATE, FERRITIN, TIBC, IRON, RETICCTPCT in the last 72 hours. Urine analysis:    Component Value Date/Time  COLORURINE YELLOW 09/29/2016 0054   APPEARANCEUR CLOUDY (A) 09/29/2016 0054   APPEARANCEUR Hazy 03/18/2012 1233   LABSPEC 1.012 09/29/2016 0054   LABSPEC 1.006 03/18/2012 1233   PHURINE 5.0 09/29/2016 0054   GLUCOSEU 50 (A) 09/29/2016 0054   GLUCOSEU Negative 03/18/2012 1233   HGBUR SMALL (A) 09/29/2016 0054   BILIRUBINUR NEGATIVE 09/29/2016 0054   BILIRUBINUR Negative 03/18/2012 1233   KETONESUR NEGATIVE 09/29/2016 0054   PROTEINUR 100 (A) 09/29/2016 0054   UROBILINOGEN 0.2 11/19/2013 2230   NITRITE NEGATIVE 09/29/2016 0054   LEUKOCYTESUR LARGE (A) 09/29/2016 0054   LEUKOCYTESUR 3+ 03/18/2012 1233    Radiological Exams on Admission: Dg Chest Port 1 View  Result Date: 09/29/2016 CLINICAL DATA:  Seizure activity.  Fever. EXAM: PORTABLE CHEST 1 VIEW COMPARISON:  11/19/2013 FINDINGS: Shallow inspiration. Elevation of the left hemidiaphragm. Consolidation or atelectasis in the left lung base behind the heart. This could indicate pneumonia. Blunting of the left costophrenic angle suggests a small pleural effusion. No pneumothorax. Normal heart size and pulmonary vascularity. IMPRESSION: Shallow inspiration with elevation of the left hemidiaphragm. Consolidation and effusion in the left base may represent pneumonia. Electronically Signed   By: Burman Nieves M.D.   On: 09/29/2016 01:14    EKG: Independently reviewed.  Assessment/Plan Principal Problem:   Post-ictal coma (HCC) Active Problems:   HCAP (healthcare-associated pneumonia)   Acute lower UTI   Acute respiratory failure with hypoxia (HCC)   Sepsis (HCC)   Seizures (HCC)    1. Post-ictal coma and seizures - 1. Seizures lysed for now after benzos 2. Spoke with Dr. Otelia Limes, informed him that patient would be NPO for immediate future due to GCS 3, do not feel comfortable or safe trying NGT or OGT in  this patient which would be high risk to cause vomiting and aspiration (and RN staff said they would refuse anyhow). 1. Keppra 1gm load then double home dose to 500mg  BID 2. Depacon double home dose to 500 TID 3. Cannot give Lamictal due to NPO status until / unless she wakes up. 3. Will get CT head given GCS 3 and seizures, though suspect post-ictal coma is still most likely.  Again patient has fairly extensive prior history of seizures it would appear. 2. Sepsis - either due to LLL HCAP as seen on CXR and/or UTI 1. Empiric merrem and vanc, note that patient has h/o MRSA and h/o ESBL as well 2. BCx, UCx, sputum Cx pending 3. Tylenol PRN fever 4. IVF: 3L NS bolus and 125 cc/hr 5. Tele monitor 3. HCAP causing acute respiratory failure with hypoxia - 1. Thankfully seems to have stabilized on NRB, initially sating 91% in ED, now 98% on NRB 2. PNA pathway 3. ABx and cultures as above  Discussion - This is a critically ill 51 yo patient who is comatose.  We will attempt to salvage with medical therapy but as noted below, the only reason she is not intubated with a GCS of 3 is her DNR status.  DNR status and intent to do "no life support or extreme measures" was confirmed with patients mother on phone.  Prognosis is guarded.  DVT prophylaxis: Lovenox Code Status: DNR/DNI - Yellow form at bedside, confirmed with mother on phone Family Communication: Spoke to mother on phone, number is [phone].  See also facesheet from SNF for more contact info. Disposition Plan: SNF after admit hopefully, though prognosis of GCS 3 patient not intubated is guarded. Consults called: None Admission status: Admit to inpatient -  critically ill patient   Hillary Bow DO [facility] Pager [phone]  If 7AM-7PM, please contact day team taking care of patient [email or web address] Password [email or web address]  09/29/2016, 3:12 AM

## 2016-09-29 NOTE — Progress Notes (Addendum)
Pharmacy Antibiotic Note  Kristi HawthorneWendy R Mccleod is a 51 y.o. female admitted on 09/28/2016 with LLL PNA with yx MRSA and ESBL.   Pharmacy has been consulted for vancomycin and meropenem dosing. WBC 27.4, Lactate 5.55, Temp 102.3. Creat WNL.  RN unable to get wt b/c pt non-verbal and non-ambulatory but guestimates 180 pounds = ~ 82 kg  Plan: Meropenem 1 gm IV q8  Vancomycin 1 gm IV q12. Goal trough 15-20 mcg/ml. F/u renal fxn, wbc, temp, culture data Vancomycin level as needed F/u accurate weight and height    Temp (24hrs), Avg:101.3 F (38.5 C), Min:100.3 F (37.9 C), Max:102.3 F (39.1 C)   Recent Labs Lab 09/29/16 0029 09/29/16 0037  WBC 27.4*  --   LATICACIDVEN  --  5.55*    CrCl cannot be calculated (Patient's most recent lab result is older than the maximum 21 days allowed.).    Allergies  Allergen Reactions  . Sulfa Antibiotics Hives  . Tape Rash    Paper tape    Antimicrobials this admission:  vanc 7/29>> Zosyn 7/29x 1 dose Meropenem 7/29>> Dose adjustments this admission:   Microbiology results: 7/29 BCx2>> 7/28 UCx>>  Thank you for allowing pharmacy to be a part of this patient's care. Herby AbrahamMichelle T. Izzabell Klasen, Pharm.D. [phone] 09/29/2016 1:32 AM

## 2016-09-29 NOTE — ED Provider Notes (Signed)
[facility]-EMERGENCY DEPT Provider Note   CSN: [id number] Arrival date & time: 09/28/16  2337   By signing my name below, I, Clarisse Gouge, attest that this documentation has been prepared under the direction and in the presence of Dione Booze, MD. Electronically signed, Clarisse Gouge, ED Scribe. 09/29/16. 12:21 AM.  History   Chief Complaint Chief Complaint  Patient presents with  . Seizures   LEVEL 5 CAVEAT: HPI and ROS limited due to altered mental status   The history is provided by medical records (nursing staff ). The history is limited by the condition of the patient. No language interpreter was used.    Kristi Perry is a 51 y.o. female with h/o cerebral palsy, epilepsy and DNR BIB EMS from [facility] to the Emergency Department concerning seizure activity that occurred shortly PTA. Pt allegedly given Ativan x 2 IM at nursing home without relief to seizure activity. EMS administered 2.5 versed shortly after arrival and the seizure activity stopped, per nurse. Triage report states pt seized for ~20 minutes prior to receiving versed. Pt has reportedly been unresponsive since, and arrived to [facility] ED unresponsive. Rectal fever of 102.3 noted on arrival; this has decreased to 100.3 on evaluation. Nursing staff states pt's blood pressure has decreased in [facility] ED.  Past Medical History:  Diagnosis Date  . Acute respiratory failure (HCC)   . Allergic rhinitis   . Anxiety   . Cerebral palsy (HCC)   . Chronic airway obstruction (HCC)   . Chronic pain syndrome   . Depression   . DVT (deep vein thrombosis) in pregnancy (HCC)   . Dysphagia   . Dysphasia   . Epilepsy (HCC)   . Hiatal hernia   . Hyperlipidemia   . Hypertension   . Hypothyroidism   . Incontinent of feces 09-25-11   hx.  . Incontinent of urine 09-25-11   hx.  . Migraine   . Mild intellectual disabilities   . Morbid obesity (HCC) 09-25-11   requires Hoyer lift. pt. doesn't stand or ambulate.  . Muscle  weakness   . Nephrolithiasis 04/05/2012  . Neurogenic bladder   . Pneumonia    hx of asp pneumonia 1/11-1/19/12  . Sacral decubitus ulcer    hx of  . Seizures (HCC)   . Sepsis(995.91)   . Sepsis(995.91)    hx of   . Unspecified psychosis   . Vitamin D deficiency     Patient Active Problem List   Diagnosis Date Noted  . Sepsis (HCC) 11/20/2013  . Unspecified hypothyroidism 11/20/2013  . Seizures (HCC) 11/20/2013  . Aspiration pneumonia (HCC) 11/20/2013  . UTI (urinary tract infection) 11/20/2013  . Acute respiratory failure with hypoxia (HCC) 11/19/2013  . Infection due to ESBL-producing Escherichia coli 04/07/2012  . Thyroid nodule 04/07/2012  . Nephrolithiasis 04/05/2012  . UTI (lower urinary tract infection) 04/03/2012  . HCAP (healthcare-associated pneumonia) 02/27/2012  . Cerebral palsy (HCC) 02/27/2012    Past Surgical History:  Procedure Laterality Date  . CYSTOSCOPY WITH URETEROSCOPY  11/07/2011   Procedure: CYSTOSCOPY WITH URETEROSCOPY;  Surgeon: Marcine Matar, MD;  Location: [facility] ORS;  Service: Urology;  Laterality: Right;  Removal of right double J stent, Insertion right double J stent  . NEPHROLITHOTOMY  10/07/2011   Procedure: NEPHROLITHOTOMY PERCUTANEOUS;  Surgeon: Marcine Matar, MD;  Location: [facility] ORS;  Service: Urology;  Laterality: Right;  kidney   . NEPHROLITHOTOMY  12/06/2011   Procedure: NEPHROLITHOTOMY PERCUTANEOUS;  Surgeon: Marcine Matar, MD;  Location: [facility] ORS;  Service: Urology;  Laterality: Right;   REPEAT PCNL   . STONE EXTRACTION WITH BASKET  11/07/2011   Procedure: STONE EXTRACTION WITH BASKET;  Surgeon: Marcine Matar, MD;  Location: [facility] ORS;  Service: Urology;  Laterality: Right;    OB History    No data available       Home Medications    Prior to Admission medications   Medication Sig Start Date End Date Taking? Authorizing Provider  albuterol (PROVENTIL HFA;VENTOLIN HFA) 108 (90 BASE) MCG/ACT inhaler Inhale 2 puffs into the  lungs every 4 (four) hours as needed for wheezing or shortness of breath. 04/07/12   Renae Fickle, MD  antiseptic oral rinse (CPC / CETYLPYRIDINIUM CHLORIDE 0.05%) 0.05 % LIQD solution 7 mLs by Mouth Rinse route 2 times daily at 12 noon and 4 pm. 11/22/13   Alison Murray, MD  arformoterol [facility]) 15 MCG/2ML NEBU Take 15 mcg by nebulization 2 (two) times daily.    [provider]  baclofen (LIORESAL) 10 MG tablet Take 10 mg by mouth 4 (four) times daily.    [provider]  budesonide (PULMICORT) 0.25 MG/2ML nebulizer solution Take 0.25 mg by nebulization 2 (two) times daily.    [provider]  chlorhexidine (PERIDEX) 0.12 % solution 15 mLs by Mouth Rinse route 2 (two) times daily. 11/22/13   Alison Murray, MD  clonazePAM (KLONOPIN) 1 MG tablet Take 1 tablet (1 mg total) by mouth 2 (two) times daily. 11/22/13   Alison Murray, MD  divalproex (DEPAKOTE) 250 MG DR tablet Take 750 mg by mouth at bedtime.    [provider]  fentaNYL (DURAGESIC - DOSED MCG/HR) 75 MCG/HR Place 1 patch (75 mcg total) onto the skin every 3 (three) days. 11/22/13   Alison Murray, MD  HYDROcodone-acetaminophen (NORCO/VICODIN) 5-325 MG per tablet Take 1 tablet by mouth every 6 (six) hours as needed for moderate pain. 11/22/13   Alison Murray, MD  ketotifen (ZADITOR) 0.025 % ophthalmic solution Place 1 drop into both eyes 2 (two) times daily.    [provider]  ketotifen (ZADITOR) 0.025 % ophthalmic solution Place 1 drop into both eyes 2 (two) times daily.    [provider]  lamoTRIgine (LAMICTAL) 100 MG tablet Take 100 mg by mouth 2 (two) times daily.     [provider]  levalbuterol Pauline Aus) 0.63 MG/3ML nebulizer solution Take 3 mLs (0.63 mg total) by nebulization every 8 (eight) hours as needed for wheezing. 03/02/12   Dellinger, Tora Kindred, PA-C  levETIRAcetam (KEPPRA) 250 MG tablet Take 250 mg by mouth every 12 (twelve) hours.    [provider]    levofloxacin (LEVAQUIN) 750 MG tablet Take 1 tablet (750 mg total) by mouth daily. 11/22/13   Alison Murray, MD  levothyroxine (SYNTHROID, LEVOTHROID) 25 MCG tablet Take 25 mcg by mouth daily before breakfast.     [provider]  LORazepam (ATIVAN) 2 MG/ML concentrated solution Take 0.5 mLs (1 mg total) by mouth every 8 (eight) hours as needed for anxiety. 11/22/13   Alison Murray, MD  Maltodextrin-Xanthan Gum (RESOURCE THICKENUP CLEAR) POWD Take 120 g by mouth as needed (to add to fluids for nectar thick). 11/22/13   Alison Murray, MD  Melatonin-Pyridoxine (MELATIN PO) Take 1 tablet by mouth at bedtime.    [provider]  montelukast (SINGULAIR) 10 MG tablet Take 10 mg by mouth every morning.     [provider]  potassium chloride (K-DUR) 10  MEQ tablet Take 10 mEq by mouth daily.    [provider]  pravastatin (PRAVACHOL) 40 MG tablet Take 40 mg by mouth daily.     [provider]  risperiDONE (RISPERDAL) 2 MG tablet Take 1 tablet (2 mg total) by mouth at bedtime. 11/22/13   Alison Murrayevine, Alma M, MD  risperiDONE microspheres (RISPERDAL CONSTA) 50 MG injection Inject 50 mg into the muscle every 14 (fourteen) days.    [provider]  senna (SENOKOT) 8.6 MG TABS tablet Take 2 tablets by mouth daily.    [provider]  sodium chloride (OCEAN) 0.65 % nasal spray Place 1 spray into the nose 2 (two) times daily.    [provider]  venlafaxine XR (EFFEXOR-XR) 150 MG 24 hr capsule Take 1 capsule (150 mg total) by mouth daily with breakfast. 11/22/13   Alison Murrayevine, Alma M, MD  vitamin C (ASCORBIC ACID) 500 MG tablet Take 500 mg by mouth 2 (two) times daily.    [provider]  Vitamin D, Ergocalciferol, (DRISDOL) 50000 UNITS CAPS capsule Take 50,000 Units by mouth every 7 (seven) days.    [provider]    Family History History reviewed. No pertinent family history.  Social History Social History  Substance Use  Topics  . Smoking status: Never Smoker  . Smokeless tobacco: Never Used  . Alcohol use No     Allergies   Sulfa antibiotics and Tape   Review of Systems Review of Systems  Unable to perform ROS: Mental status change     Physical Exam Updated Vital Signs BP (!) 150/88 (BP Location: Right Arm)   Pulse (!) 122   Temp (!) 102.3 F (39.1 C) (Rectal)   Resp 12   SpO2 94%   Physical Exam  Constitutional: She is oriented to person, place, and time. She appears well-developed and well-nourished.  unresponsive  HENT:  Head: Normocephalic and atraumatic.  Eyes: Pupils are equal, round, and reactive to light. EOM are normal.  Neck: Normal range of motion. Neck supple. No JVD present.  Cardiovascular: Regular rhythm and normal heart sounds.  Tachycardia present.   No murmur heard. Pulmonary/Chest: Effort normal and breath sounds normal. She has no wheezes. She has no rales. She exhibits no tenderness.  Abdominal: Soft. Bowel sounds are normal. She exhibits no distension and no mass. There is no tenderness.  Musculoskeletal: Normal range of motion. She exhibits no edema.  Trace edema  Lymphadenopathy:    She has no cervical adenopathy.  Neurological: She is alert and oriented to person, place, and time.  unresponsive  Skin: Skin is warm and dry. No rash noted.  Psychiatric: She has a normal mood and affect. Her behavior is normal. Judgment and thought content normal.  Nursing note and vitals reviewed.    ED Treatments / Results  DIAGNOSTIC STUDIES: Oxygen Saturation is 91-94% on NRB, low by my interpretation.    COORDINATION OF CARE: 12:18 AM-Discussed next steps with pt. Pt verbalized understanding and is agreeable with the plan. Imaging ordered.   Labs (all labs ordered are listed, but only abnormal results are displayed) Labs Reviewed  VALPROIC ACID LEVEL - Abnormal; Notable for the following:       Result Value   Valproic Acid Lvl <10 (*)    All other components  within normal limits  COMPREHENSIVE METABOLIC PANEL - Abnormal; Notable for the following:    Potassium 2.8 (*)    Glucose, Bld 222 (*)    Alkaline Phosphatase 130 (*)  Anion gap 16 (*)    All other components within normal limits  CBC WITH DIFFERENTIAL/PLATELET - Abnormal; Notable for the following:    WBC 27.4 (*)    RBC 5.45 (*)    Hemoglobin 17.2 (*)    HCT 50.0 (*)    Neutro Abs 21.3 (*)    Lymphs Abs 4.2 (*)    Monocytes Absolute 1.7 (*)    All other components within normal limits  PROTIME-INR - Abnormal; Notable for the following:    Prothrombin Time 17.2 (*)    All other components within normal limits  URINALYSIS, ROUTINE W REFLEX MICROSCOPIC - Abnormal; Notable for the following:    APPearance CLOUDY (*)    Glucose, UA 50 (*)    Hgb urine dipstick SMALL (*)    Protein, ur 100 (*)    Leukocytes, UA LARGE (*)    Bacteria, UA MANY (*)    Squamous Epithelial / LPF 6-30 (*)    All other components within normal limits  CBC - Abnormal; Notable for the following:    WBC 28.6 (*)    RBC 5.16 (*)    Hemoglobin 16.5 (*)    HCT 49.0 (*)    All other components within normal limits  BASIC METABOLIC PANEL - Abnormal; Notable for the following:    Glucose, Bld 160 (*)    Calcium 7.1 (*)    All other components within normal limits  GLUCOSE, CAPILLARY - Abnormal; Notable for the following:    Glucose-Capillary 155 (*)    All other components within normal limits  GLUCOSE, CAPILLARY - Abnormal; Notable for the following:    Glucose-Capillary 148 (*)    All other components within normal limits  CBG MONITORING, ED - Abnormal; Notable for the following:    Glucose-Capillary 217 (*)    All other components within normal limits  I-STAT CG4 LACTIC ACID, ED - Abnormal; Notable for the following:    Lactic Acid, Venous 5.55 (*)    All other components within normal limits  MRSA PCR SCREENING  CULTURE, BLOOD (ROUTINE X 2)  CULTURE, BLOOD (ROUTINE X 2)  URINE CULTURE   CULTURE, EXPECTORATED SPUTUM-ASSESSMENT  LACTIC ACID, PLASMA  HIV ANTIBODY (ROUTINE TESTING)  STREP PNEUMONIAE URINARY ANTIGEN  I-STAT BETA HCG BLOOD, ED ([facility], [facility], [facility] ONLY)  I-STAT CG4 LACTIC ACID, ED   Radiology Ct Head Wo Contrast  Result Date: 09/29/2016 CLINICAL DATA:  Seizures tonight.  Postictal at this time. EXAM: CT HEAD WITHOUT CONTRAST TECHNIQUE: Contiguous axial images were obtained from the base of the skull through the vertex without intravenous contrast. COMPARISON:  08/26/2012 FINDINGS: Brain: There is no intracranial hemorrhage, mass or evidence of acute infarction. There is mild generalized atrophy. There is moderate chronic microvascular ischemic change. There is no significant extra-axial fluid collection. No acute intracranial findings are evident. Vascular: No hyperdense vessel or unexpected calcification. Skull: Normal. Negative for fracture or focal lesion. Sinuses/Orbits: No acute finding. Other: None. IMPRESSION: No acute intracranial findings. There is mild generalized atrophy and moderate chronic white matter hypodensity which likely represents small vessel ischemic disease. Electronically Signed   By: Ellery Plunkaniel R Mitchell M.D.   On: 09/29/2016 04:01   Dg Chest Port 1 View  Result Date: 09/29/2016 CLINICAL DATA:  Seizure activity.  Fever. EXAM: PORTABLE CHEST 1 VIEW COMPARISON:  11/19/2013 FINDINGS: Shallow inspiration. Elevation of the left hemidiaphragm. Consolidation or atelectasis in the left lung base behind the heart. This could indicate pneumonia. Blunting of the left costophrenic angle  suggests a small pleural effusion. No pneumothorax. Normal heart size and pulmonary vascularity. IMPRESSION: Shallow inspiration with elevation of the left hemidiaphragm. Consolidation and effusion in the left base may represent pneumonia. Electronically Signed   By: Burman Nieves M.D.   On: 09/29/2016 01:14    Procedures Procedures (including critical care time) CRITICAL  CARE Performed by: WUJWJ,XBJYN Total critical care time: 90 minutes Critical care time was exclusive of separately billable procedures and treating other patients. Critical care was necessary to treat or prevent imminent or life-threatening deterioration. Critical care was time spent personally by me on the following activities: development of treatment plan with patient and/or surrogate as well as nursing, discussions with consultants, evaluation of patient's response to treatment, examination of patient, obtaining history from patient or surrogate, ordering and performing treatments and interventions, ordering and review of laboratory studies, ordering and review of radiographic studies, pulse oximetry and re-evaluation of patient's condition.  Medications Ordered in ED Medications  acetaminophen (TYLENOL) suppository 650 mg (not administered)     Initial Impression / Assessment and Plan / ED Course  I have reviewed the triage vital signs and the nursing notes.  Pertinent labs & imaging results that were available during my care of the patient were reviewed by me and considered in my medical decision making (see chart for details).  Patient presenting post seizure with fever and tachycardia and altered mentation. She fits sepsis criteria and is started on sepsis pathway. Chest x-ray shows possible healthcare associated pneumonia, so she is started on appropriate antibiotics. Urinalysis also suggests urinary tract infection. She is DO NOT RESUSCITATE, so intubation was not performed. Lactic acid is come back significantly elevated, so she was started on early goal-directed fluid therapy. With this treatment, hypoxia improved and heart rate came down. At no time was she hypotensive. Case is discussed with Dr. Julian Reil of triad hospitalists who agrees to admit the patient.  Final Clinical Impressions(s) / ED Diagnoses   Final diagnoses:  Sepsis, due to unspecified organism (HCC)  HCAP  (healthcare-associated pneumonia)  Urinary tract infection without hematuria, site unspecified    New Prescriptions New Prescriptions   No medications on file   I personally performed the services described in this documentation, which was scribed in my presence. The recorded information has been reviewed and is accurate.       Dione Booze, MD 09/29/16 [phone]

## 2016-09-29 NOTE — ED Notes (Signed)
In to administer tylenol suppository-noted moderate amount dried stool out of rectum-removed-patient impacted with stool

## 2016-09-29 NOTE — Consult Note (Signed)
Consultation Note Date: 09/29/2016   Patient Name: Kristi Perry  DOB: Jul 06, 1965  MRN: [id number]  Age / Sex: 51 y.o., female  PCP: Angela Cox, MD Referring Physician: Leatha Gilding, MD  Reason for Consultation: Establishing goals of care  HPI/Patient Profile: 52 y.o. female  with past medical history of  CP, seizures admitted on 09/28/2016 with sepsis, aspiration PNA.   Clinical Assessment and Goals of Care:  51 yo lady, has cerebral palsy, is with functional quadriplegia and bed bound state, has seizure disorder. She lives in [facility]. She has been admitted with sepsis, seizures, possible asp pna.   She is comatose in the step down unit at [facility]. A palliative consult has been placed for goals of care discussions with next of kin, patient's mother.   Patient is resting in bed, I have discussed in detail with bedside RNs, appreciate their information. While patient is maintaining her airway, she does not respond even to pain.   Patient seen, she does not open her eyes, she does not verbalize or follow commands, she muttered "hmm" when her name was called out loudly, she opened and closed her lower jaw spontaneously.   See recommendations below, thank you for the consult.   NEXT OF KIN Mother    SUMMARY OF RECOMMENDATIONS    1. Agree with current anti epileptic regimen 2. Agree with current Antibiotic regimen 3. Agree with DNR 4. Call placed, unable to discuss with mother, will re attempt later today. Will discuss hospital course with her, palliative will monitor trajectory and help guide decision making.  5. Add Ativan IV PRN if patient has new seizure activity noted.   Code Status/Advance Care Planning:  DNR    Symptom Management:    as above   Palliative Prophylaxis:   Delirium Protocol   Psycho-social/Spiritual:   Desire for further Chaplaincy  support:no  Additional Recommendations: Caregiving  Support/Resources  Prognosis:   Unable to determine  Discharge Planning: To Be Determined      Primary Diagnoses: Present on Admission: . HCAP (healthcare-associated pneumonia) . Acute respiratory failure with hypoxia (HCC) . Post-ictal coma (HCC) . Acute lower UTI . Sepsis (HCC)   I have reviewed the medical record, interviewed the patient and family, and examined the patient. The following aspects are pertinent.  Past Medical History:  Diagnosis Date  . Acute respiratory failure (HCC)   . Allergic rhinitis   . Anxiety   . Cerebral palsy (HCC)   . Chronic airway obstruction (HCC)   . Chronic pain syndrome   . Depression   . DVT (deep vein thrombosis) in pregnancy (HCC)   . Dysphagia   . Dysphasia   . Epilepsy (HCC)   . Hiatal hernia   . Hyperlipidemia   . Hypertension   . Hypothyroidism   . Incontinent of feces 09-25-11   hx.  . Incontinent of urine 09-25-11   hx.  . Migraine   . Mild intellectual disabilities   . Morbid obesity (HCC) 09-25-11  requires [employer] lift. pt. doesn't stand or ambulate.  . Muscle weakness   . Nephrolithiasis 04/05/2012  . Neurogenic bladder   . Pneumonia    hx of asp pneumonia 1/11-1/19/12  . Sacral decubitus ulcer    hx of  . Seizures (HCC)   . Sepsis(995.91)   . Sepsis(995.91)    hx of   . Unspecified psychosis   . Vitamin D deficiency    Social History   Social History  . Marital status: Married    Spouse name: N/A  . Number of children: N/A  . Years of education: N/A   Social History Main Topics  . Smoking status: Never Smoker  . Smokeless tobacco: Never Used  . Alcohol use No  . Drug use: No  . Sexual activity: No   Other Topics Concern  . None   Social History Narrative  . None   History reviewed. No pertinent family history. Scheduled Meds: . enoxaparin (LOVENOX) injection  40 mg Subcutaneous Q24H   Continuous Infusions: . sodium chloride 75 mL/hr  at 09/29/16 0644  . levETIRAcetam    . meropenem (MERREM) IV Stopped (09/29/16 1610)  . valproate sodium Stopped (09/29/16 0744)  . vancomycin     PRN Meds:.acetaminophen Medications Prior to Admission:  Prior to Admission medications   Medication Sig Start Date End Date Taking? Authorizing Provider  acetaminophen (TYLENOL) 325 MG tablet Take 650 mg by mouth every 4 (four) hours as needed for mild pain or moderate pain.   Yes [provider]  albuterol (PROVENTIL HFA;VENTOLIN HFA) 108 (90 BASE) MCG/ACT inhaler Inhale 2 puffs into the lungs every 4 (four) hours as needed for wheezing or shortness of breath. 04/07/12  Yes Short, Thea Silversmith, MD  albuterol (PROVENTIL) (2.5 MG/3ML) 0.083% nebulizer solution Take 2.5 mg by nebulization every 8 (eight) hours as needed for wheezing or shortness of breath.   Yes [provider]  antiseptic oral rinse (BIOTENE) LIQD 15 mLs by Mouth Rinse route as needed for dry mouth.   Yes [provider]  arformoterol (BROVANA) 15 MCG/2ML NEBU Take 15 mcg by nebulization 2 (two) times daily.   Yes [provider]  baclofen (LIORESAL) 10 MG tablet Take 10 mg by mouth 4 (four) times daily.   Yes [provider]  budesonide (PULMICORT) 0.25 MG/2ML nebulizer solution Take 0.25 mg by nebulization 2 (two) times daily as needed (SOB, wheezing).    Yes [provider]  clonazePAM (KLONOPIN) 1 MG tablet Take 1 tablet (1 mg total) by mouth 2 (two) times daily. Patient taking differently: Take 0.5 mg by mouth daily after breakfast.  11/22/13  Yes Alison Murray, MD  cloZAPine (CLOZARIL) 100 MG tablet Take 75 mg by mouth daily.   Yes [provider]  divalproex (DEPAKOTE) 250 MG DR tablet Take 750 mg by mouth at bedtime.   Yes [provider]  fentaNYL (DURAGESIC - DOSED MCG/HR) 75 MCG/HR Place 1 patch (75 mcg total) onto the skin every 3 (three) days. 11/22/13  Yes Alison Murray, MD  fexofenadine-pseudoephedrine  (ALLEGRA-D) 60-120 MG 12 hr tablet Take 1 tablet by mouth daily.   Yes [provider]  fluticasone (FLONASE) 50 MCG/ACT nasal spray Place 2 sprays into both nostrils daily.   Yes [provider]  furosemide (LASIX) 20 MG tablet Take 20 mg by mouth daily.   Yes [provider]  HYDROcodone-acetaminophen (NORCO/VICODIN) 5-325 MG per tablet Take 1 tablet by mouth every 6 (six) hours as needed for  moderate pain. 11/22/13  Yes Alison Murrayevine, Alma M, MD  ketotifen (ZADITOR) 0.025 % ophthalmic solution Place 1 drop into both eyes 2 (two) times daily.   Yes [provider]  lamoTRIgine (LAMICTAL) 100 MG tablet Take 100 mg by mouth 2 (two) times daily.    Yes [provider]  levETIRAcetam (KEPPRA) 250 MG tablet Take 250 mg by mouth every 12 (twelve) hours.   Yes [provider]  Melatonin-Pyridoxine (MELATIN PO) Take 3 mg by mouth at bedtime.    Yes [provider]  montelukast (SINGULAIR) 10 MG tablet Take 10 mg by mouth at bedtime.    Yes [provider]  potassium chloride (K-DUR) 10 MEQ tablet Take 10 mEq by mouth 3 (three) times daily.    Yes [provider]  pravastatin (PRAVACHOL) 40 MG tablet Take 40 mg by mouth daily.    Yes [provider]  risperidone (RISPERDAL) 4 MG tablet Take 4 mg by mouth at bedtime.   Yes [provider]  senna (SENOKOT) 8.6 MG TABS tablet Take 2 tablets by mouth daily.   Yes [provider]  sodium chloride (OCEAN) 0.65 % nasal spray Place 1 spray into the nose 2 (two) times daily.   Yes [provider]  venlafaxine XR (EFFEXOR-XR) 150 MG 24 hr capsule Take 1 capsule (150 mg total) by mouth daily with breakfast. 11/22/13  Yes Alison Murrayevine, Alma M, MD  venlafaxine XR (EFFEXOR-XR) 75 MG 24 hr capsule Take 75 mg by mouth daily with breakfast.   Yes [provider]  vitamin C (ASCORBIC ACID) 500 MG tablet Take 500 mg by mouth 2 (two) times daily.   Yes [provider]  Vitamin D, Ergocalciferol, (DRISDOL) 50000 UNITS CAPS capsule Take 50,000 Units by mouth every 7 (seven) days.   Yes [provider]  LORazepam (ATIVAN) 2 MG/ML concentrated solution Take 0.5 mLs (1 mg total) by mouth every 8 (eight) hours as needed for anxiety. Patient not taking: Reported on 09/29/2016 11/22/13   Alison Murrayevine, Alma M, MD  risperiDONE (RISPERDAL) 2 MG tablet Take 1 tablet (2 mg total) by mouth at bedtime. Patient not taking: Reported on 09/29/2016 11/22/13   Alison Murrayevine, Alma M, MD   Allergies  Allergen Reactions  . Sulfa Antibiotics Hives  . Tape Rash    Paper tape   Review of Systems Non verbal  Physical Exam Young lady resting in bed Does not open eyes Does not follow commands S1 S2 Shallow diminished breath sounds Utters "hmm" when name is called, opens and closes lower jaw spontaneously Non verbal No edema  Vital Signs: BP 123/65   Pulse 96   Temp 97.6 F (36.4 C) (Axillary)   Resp 19   Wt 81.6 kg (180 lb)   LMP  (LMP Unknown) Comment: pt unresponsive   SpO2 100%   BMI 35.15 kg/m  Pain Assessment: CPOT       SpO2: SpO2: 100 % O2 Device:SpO2: 100 % O2 Flow Rate: .   IO: Intake/output summary:  Intake/Output Summary (Last 24 hours) at 09/29/16 1128 Last data filed at 09/29/16 0644  Gross per 24 hour  Intake          3736.25 ml  Output                0 ml  Net          3736.25 ml    LBM: Last BM Date:  (unknown) Baseline Weight: Weight: 81.6 kg (180 lb) Most  recent weight: Weight: 81.6 kg (180 lb)     Palliative Assessment/Data:   Flowsheet Rows     Most Recent Value  Intake Tab  Referral Department  Hospitalist  Unit at Time of Referral  Intermediate Care Unit  Palliative Care Primary Diagnosis  Other (Comment) [seizure, sepsis, pna, CP]  Palliative Care Type  New Palliative care  Reason for referral  Clarify Goals of Care  Date first seen by Palliative Care  09/29/16  Clinical Assessment  Palliative Performance  Scale Score  10%  Pain Max last 24 hours  2  Pain Min Last 24 hours  1  Psychosocial & Spiritual Assessment  Palliative Care Outcomes  Patient/Family meeting held?  No  Palliative Care Outcomes  Other (Comment) [call placed, unable to reach mother ]      Time In:  10 Time Out:  11 Time Total:  60 min  Greater than 50%  of this time was spent counseling and coordinating care related to the above assessment and plan.  Signed by: Rosalin HawkingZeba Rashee Marschall, MD  [phone]  [phone]  Please contact Palliative Medicine Team phone at [phone] for questions and concerns.  For individual provider: See Loretha StaplerAmion

## 2016-09-30 LAB — BASIC METABOLIC PANEL
ANION GAP: 12 (ref 5–15)
BUN: <5 mg/dL — ABNORMAL LOW (ref 6–20)
CHLORIDE: 98 mmol/L — AB (ref 101–111)
CO2: 26 mmol/L (ref 22–32)
Calcium: 8.2 mg/dL — ABNORMAL LOW (ref 8.9–10.3)
Creatinine, Ser: 0.42 mg/dL — ABNORMAL LOW (ref 0.44–1.00)
GFR calc non Af Amer: >60 mL/min (ref >60)
Glucose, Bld: 114 mg/dL — ABNORMAL HIGH (ref 65–99)
POTASSIUM: 3 mmol/L — AB (ref 3.5–5.1)
SODIUM: 136 mmol/L (ref 135–145)

## 2016-09-30 LAB — CBC
HCT: 44.8 % (ref 36.0–46.0)
HEMOGLOBIN: 15.6 g/dL — AB (ref 12.0–15.0)
MCH: 31.3 pg (ref 26.0–34.0)
MCHC: 34.8 g/dL (ref 30.0–36.0)
MCV: 89.8 fL (ref 78.0–100.0)
Platelets: 200 10*3/uL (ref 150–400)
RBC: 4.99 MIL/uL (ref 3.87–5.11)
RDW: 13.3 % (ref 11.5–15.5)
WBC: 16.1 10*3/uL — AB (ref 4.0–10.5)

## 2016-09-30 MED ORDER — RESOURCE THICKENUP CLEAR PO POWD
ORAL | Status: DC | PRN
  Filled 2016-09-30 (×2): qty 125

## 2016-09-30 MED ORDER — ORAL CARE MOUTH RINSE
15.0000 mL | Freq: Two times a day (BID) | OROMUCOSAL | Status: DC
  Administered 2016-09-30 – 2016-10-10 (×16): 15 mL via OROMUCOSAL

## 2016-09-30 MED ORDER — POTASSIUM CHLORIDE 10 MEQ/100ML IV SOLN
10.0000 meq | INTRAVENOUS | Status: AC
  Administered 2016-09-30 (×2): 10 meq via INTRAVENOUS
  Filled 2016-09-30 (×2): qty 100

## 2016-09-30 MED ORDER — CHLORHEXIDINE GLUCONATE 0.12 % MT SOLN
15.0000 mL | Freq: Two times a day (BID) | OROMUCOSAL | Status: DC
  Administered 2016-09-30 – 2016-10-10 (×16): 15 mL via OROMUCOSAL
  Filled 2016-09-30 (×11): qty 15

## 2016-09-30 NOTE — Progress Notes (Signed)
PROGRESS NOTE  Kristi Perry ZOX:[id number] DOB: 08-28-1965 DOA: 09/28/2016 PCP: Angela Cox, MD   LOS: 1 day   Brief Narrative / Interim history: 51-year-old female with history of cerebral palsy, seizures, aspiration pneumonia who came in after prolonged seizure activity at the SNF.  She was admitted to the hospital in guarded prognosis being comatose and unresponsive, placed in stepdown and started on IV antiepileptics.  Assessment & Plan: Principal Problem:   Post-ictal coma (HCC) Active Problems:   HCAP (healthcare-associated pneumonia)   Acute lower UTI   Acute respiratory failure with hypoxia (HCC)   Sepsis (HCC)   Seizures (HCC)   Post ictal coma and seizures -Seizure activity appears to have stopped. Continue Keppra and Depacon IV -She appears to be improving today, she is awake and asking for her mom -Asked speech to see patient, if she is able to eat will switch her to her home antiepileptic regimen.  Likely her seizures were in the setting of an active infectious process  Sepsis due to HCAP causing acute respiratory failure with hypoxia -Broad-spectrum antibiotics for now with meropenem -MRSA PCR was negative, discontinue vancomycin  UTI -Continue meropenem, cultures are pending  Underlying cerebral palsy, bed bound state, functional quadriplegia, and seizure disorder -guarded prognosis overall -Palliative care consulted, appreciate input   DVT prophylaxis: Lovenox Code Status: Full code Family Communication: no family at bedside Disposition Plan: home when ready   Consultants:   Palliative care  Procedures:   None  Antimicrobials:  Vancomycin 7/29 >> 7/30   Meropenem 7/29 >>  Subjective: -Alert, awake, interacts some with me but does not follow all the commands, asking for her mom and "not to be left alone"  Objective: Vitals:   09/30/16 0700 09/30/16 0800 09/30/16 0900 09/30/16 1000  BP: (!) 147/81 136/78 (!) 143/81 (!) 141/79    Pulse: (!) 103 (!) 108 (!) 101 97  Resp: (!) 22 16 (!) 24 20  Temp:  98.7 F (37.1 C)    TempSrc:  Oral    SpO2: 95% 95% 97% 97%  Weight:      Height:        Intake/Output Summary (Last 24 hours) at 09/30/16 1012 Last data filed at 09/30/16 0645  Gross per 24 hour  Intake             3070 ml  Output             2000 ml  Net             1070 ml   Filed Weights   09/29/16 0120 09/29/16 1200  Weight: 81.6 kg (180 lb) 67.9 kg (149 lb 11.1 oz)    Examination:  Vitals:   09/30/16 0700 09/30/16 0800 09/30/16 0900 09/30/16 1000  BP: (!) 147/81 136/78 (!) 143/81 (!) 141/79  Pulse: (!) 103 (!) 108 (!) 101 97  Resp: (!) 22 16 (!) 24 20  Temp:  98.7 F (37.1 C)    TempSrc:  Oral    SpO2: 95% 95% 97% 97%  Weight:      Height:        Constitutional: NAD Eyes: lids and conjunctivae normal Respiratory: coarse bilaterally, no wheezing, no crackles. Normal respiratory effort. Cardiovascular: Regular rate and rhythm, no murmurs / rubs / gallops. No LE edema. 2+ pedal pulses.  Abdomen: no tenderness. Bowel sounds positive.  Neurologic: cerebral palsy.   Data Reviewed: I have independently reviewed following labs and imaging studies   CBC:  Recent Labs  Lab 09/29/16 0029 09/29/16 0630 09/30/16 0355  WBC 27.4* 28.6* 16.1*  NEUTROABS 21.3*  --   --   HGB 17.2* 16.5* 15.6*  HCT 50.0* 49.0* 44.8  MCV 91.7 95.0 89.8  PLT 365 239 200   Basic Metabolic Panel:  Recent Labs Lab 09/29/16 0029 09/29/16 0630 09/30/16 0355  NA 143 143 136  K 2.8* 3.6 3.0*  CL 102 107 98*  CO2 25 29 26   GLUCOSE 222* 160* 114*  BUN 12 8 <5*  CREATININE 0.94 0.67 0.42*  CALCIUM 9.5 7.1* 8.2*   GFR: Estimated Creatinine Clearance: 66.8 mL/min (A) (by C-G formula based on SCr of 0.42 mg/dL (L)). Liver Function Tests:  Recent Labs Lab 09/29/16 0029  AST 39  ALT 25  ALKPHOS 130*  BILITOT 0.6  PROT 7.5  ALBUMIN 4.3   No results for input(s): LIPASE, AMYLASE in the last 168 hours. No  results for input(s): AMMONIA in the last 168 hours. Coagulation Profile:  Recent Labs Lab 09/29/16 0029  INR 1.39   Cardiac Enzymes: No results for input(s): CKTOTAL, CKMB, CKMBINDEX, TROPONINI in the last 168 hours. BNP (last 3 results) No results for input(s): PROBNP in the last 8760 hours. HbA1C: No results for input(s): HGBA1C in the last 72 hours. CBG:  Recent Labs Lab 09/28/16 2340 09/29/16 0750 09/29/16 0812 09/29/16 1227 09/29/16 1638  GLUCAP 217* 155* 148* 134* 112*   Lipid Profile: No results for input(s): CHOL, HDL, LDLCALC, TRIG, CHOLHDL, LDLDIRECT in the last 72 hours. Thyroid Function Tests: No results for input(s): TSH, T4TOTAL, FREET4, T3FREE, THYROIDAB in the last 72 hours. Anemia Panel: No results for input(s): VITAMINB12, FOLATE, FERRITIN, TIBC, IRON, RETICCTPCT in the last 72 hours. Urine analysis:    Component Value Date/Time   COLORURINE YELLOW 09/29/2016 0054   APPEARANCEUR CLOUDY (A) 09/29/2016 0054   APPEARANCEUR Hazy 03/18/2012 1233   LABSPEC 1.012 09/29/2016 0054   LABSPEC 1.006 03/18/2012 1233   PHURINE 5.0 09/29/2016 0054   GLUCOSEU 50 (A) 09/29/2016 0054   GLUCOSEU Negative 03/18/2012 1233   HGBUR SMALL (A) 09/29/2016 0054   BILIRUBINUR NEGATIVE 09/29/2016 0054   BILIRUBINUR Negative 03/18/2012 1233   KETONESUR NEGATIVE 09/29/2016 0054   PROTEINUR 100 (A) 09/29/2016 0054   UROBILINOGEN 0.2 11/19/2013 2230   NITRITE NEGATIVE 09/29/2016 0054   LEUKOCYTESUR LARGE (A) 09/29/2016 0054   LEUKOCYTESUR 3+ 03/18/2012 1233   Sepsis Labs: Invalid input(s): PROCALCITONIN, LACTICIDVEN  Recent Results (from the past 240 hour(s))  Culture, blood (Routine x 2)     Status: None (Preliminary result)   Collection Time: 09/29/16 12:27 AM  Result Value Ref Range Status   Specimen Description BLOOD LEFT HAND  Final   Special Requests   Final    BOTTLES DRAWN AEROBIC AND ANAEROBIC Blood Culture adequate volume   Culture   Final    NO GROWTH < 12  HOURS Performed at [facility], [address] [place], [place] [address]    Report Status PENDING  Incomplete  Culture, blood (Routine x 2)     Status: None (Preliminary result)   Collection Time: 09/29/16 12:27 AM  Result Value Ref Range Status   Specimen Description BLOOD RIGHT ANTECUBITAL  Final   Special Requests   Final    BOTTLES DRAWN AEROBIC AND ANAEROBIC Blood Culture adequate volume   Culture   Final    NO GROWTH < 12 HOURS Performed at [facility], [address] [place], [place] [address]  Report Status PENDING  Incomplete  MRSA PCR Screening     Status: None   Collection Time: 09/29/16  5:23 AM  Result Value Ref Range Status   MRSA by PCR NEGATIVE NEGATIVE Final    Comment:        The GeneXpert MRSA Assay (FDA approved for NASAL specimens only), is one component of a comprehensive MRSA colonization surveillance program. It is not intended to diagnose MRSA infection nor to guide or monitor treatment for MRSA infections.       Radiology Studies: Ct Head Wo Contrast  Result Date: 09/29/2016 CLINICAL DATA:  Seizures tonight.  Postictal at this time. EXAM: CT HEAD WITHOUT CONTRAST TECHNIQUE: Contiguous axial images were obtained from the base of the skull through the vertex without intravenous contrast. COMPARISON:  08/26/2012 FINDINGS: Brain: There is no intracranial hemorrhage, mass or evidence of acute infarction. There is mild generalized atrophy. There is moderate chronic microvascular ischemic change. There is no significant extra-axial fluid collection. No acute intracranial findings are evident. Vascular: No hyperdense vessel or unexpected calcification. Skull: Normal. Negative for fracture or focal lesion. Sinuses/Orbits: No acute finding. Other: None. IMPRESSION: No acute intracranial findings. There is mild generalized atrophy and moderate chronic white matter hypodensity which likely represents small vessel ischemic disease. Electronically  Signed   By: Ellery Plunkaniel R Mitchell M.D.   On: 09/29/2016 04:01   Dg Chest Port 1 View  Result Date: 09/29/2016 CLINICAL DATA:  Seizure activity.  Fever. EXAM: PORTABLE CHEST 1 VIEW COMPARISON:  11/19/2013 FINDINGS: Shallow inspiration. Elevation of the left hemidiaphragm. Consolidation or atelectasis in the left lung base behind the heart. This could indicate pneumonia. Blunting of the left costophrenic angle suggests a small pleural effusion. No pneumothorax. Normal heart size and pulmonary vascularity. IMPRESSION: Shallow inspiration with elevation of the left hemidiaphragm. Consolidation and effusion in the left base may represent pneumonia. Electronically Signed   By: Burman NievesWilliam  Stevens M.D.   On: 09/29/2016 01:14   Scheduled Meds: . enoxaparin (LOVENOX) injection  40 mg Subcutaneous Q24H   Continuous Infusions: . sodium chloride 75 mL/hr at 09/30/16 0631  . levETIRAcetam 500 mg (09/30/16 0953)  . meropenem (MERREM) IV Stopped (09/30/16 0554)  . valproate sodium Stopped (09/30/16 0734)  . vancomycin Stopped (09/30/16 0214)    Pamella Pertostin Sherrey North, MD, PhD [facility] Pager [phone]  If 7PM-7AM, please contact night-coverage [email or web address] Password [email or web address] 09/30/2016, 10:12 AM

## 2016-09-30 NOTE — [place] FL2 (Addendum)
[place] MEDICAID FL2 LEVEL OF CARE SCREENING TOOL     IDENTIFICATION  Patient Name: Kristi LabradorWendy R Weed Birthdate: 04/15/1965 Sex: female Admission Date (Current Location): 09/28/2016  [facility] [place]   Facility and Address:  [facility],  [address], [place] [address]      Provider Number: [id number]  Attending Physician Name and Address:  Leatha GildingGherghe, Costin M, MD  Relative Name and Phone Number:       Current Level of Care: Hospital Recommended Level of Care: Skilled Nursing Facility Prior Approval Number:    Date Approved/Denied:   PASRR Number: [id number]   Discharge Plan: SNF    Current Diagnoses: Patient Active Problem List   Diagnosis Date Noted  . Post-ictal coma (HCC) 09/29/2016  . Sepsis (HCC) 11/20/2013  . Unspecified hypothyroidism 11/20/2013  . Seizures (HCC) 11/20/2013  . Aspiration pneumonia (HCC) 11/20/2013  . UTI (urinary tract infection) 11/20/2013  . Acute respiratory failure with hypoxia (HCC) 11/19/2013  . Infection due to ESBL-producing Escherichia coli 04/07/2012  . Thyroid nodule 04/07/2012  . Nephrolithiasis 04/05/2012  . Acute lower UTI 04/03/2012  . HCAP (healthcare-associated pneumonia) 02/27/2012  . Cerebral palsy (HCC) 02/27/2012    Orientation RESPIRATION BLADDER Height & Weight     Self  Normal External catheter Weight: 67.9 kg (149 lb 11.1 oz) Height:  4\' 9"  (144.8 cm)  BEHAVIORAL SYMPTOMS/MOOD NEUROLOGICAL BOWEL NUTRITION STATUS  Other (Comment) (no behaviors) Convulsions/Seizures Incontinent  (see dc summary)  AMBULATORY STATUS COMMUNICATION OF NEEDS Skin   Total Care Verbally Other (Comment) (Stage 2 pressure ulcer on buttocks)                       Personal Care Assistance Level of Assistance  Bathing, Feeding, Dressing, Total care Bathing Assistance: Maximum assistance Feeding assistance: Maximum assistance Dressing Assistance: Maximum assistance Total Care Assistance: Maximum  assistance   Functional Limitations Info  Sight, Hearing, Speech Sight Info: Adequate Hearing Info: Adequate Speech Info: Adequate    SPECIAL CARE FACTORS FREQUENCY                       Contractures Contractures Info: Not present    Additional Factors Info  Code Status, Allergies, Psychotropic, Isolation Precautions Code Status Info: DNR Allergies Info: SULFA ANTIBIOTICS Psychotropic Info: Effexor-XR, risperdal, ativan, klonpin   Isolation Precautions Info:   h/o MRSA and h/o ESBL      Current Medications (09/30/2016):  This is the current hospital active medication list Current Facility-Administered Medications  Medication Dose Route Frequency Provider Last Rate Last Dose  . 0.9 %  sodium chloride infusion   Intravenous Continuous Hillary BowGardner, Jared M, DO 75 mL/hr at 09/30/16 0631    . acetaminophen (TYLENOL) suppository 650 mg  650 mg Rectal Q6H PRN Hillary BowGardner, Jared M, DO      . chlorhexidine (PERIDEX) 0.12 % solution 15 mL  15 mL Mouth Rinse BID Leatha GildingGherghe, Costin M, MD   15 mL at 09/30/16 1320  . enoxaparin (LOVENOX) injection 40 mg  40 mg Subcutaneous Q24H Lyda PeroneGardner, Jared M, DO   40 mg at 09/30/16 [id number]  . levETIRAcetam (KEPPRA) 500 mg in sodium chloride 0.9 % 100 mL IVPB  500 mg Intravenous Q12H Hillary BowGardner, Jared M, DO   Stopped at 09/30/16 1008  . LORazepam (ATIVAN) injection 2 mg  2 mg Intravenous Once PRN Rosalin HawkingAnwar, Zeba, MD      . MEDLINE mouth rinse  15 mL Mouth Rinse  [id number] Leatha GildingGherghe, Costin M, MD   15 mL at 09/30/16 1320  . meropenem (MERREM) 1 g in sodium chloride 0.9 % 100 mL IVPB  1 g Intravenous Q8H Dione BoozeGlick, David, MD   Stopped at 09/30/16 1349  . RESOURCE THICKENUP CLEAR   Oral PRN Leatha GildingGherghe, Costin M, MD      . valproate (DEPACON) 500 mg in dextrose 5 % 50 mL IVPB  500 mg Intravenous Q8H Gardner, Jared M, DO 55 mL/hr at 09/30/16 1319 500 mg at 09/30/16 1319     Discharge Medications: Please see discharge summary for a list of discharge medications.  Relevant Imaging  Results:  Relevant Lab Results:   Additional Information SS # [id number]  Pt will need LTC  Haidinger, Dickey GaveJamie Lee, LCSW

## 2016-09-30 NOTE — Evaluation (Signed)
Clinical/Bedside Swallow Evaluation Patient Details  Name: Kristi Perry MRN: [id number] Date of Birth: 05/14/1965  Today's Date: 09/30/2016 Time: SLP Start Time (ACUTE ONLY): 1138 SLP Stop Time (ACUTE ONLY): 1212 SLP Time Calculation (min) (ACUTE ONLY): 34 min  Past Medical History:  Past Medical History:  Diagnosis Date  . Acute respiratory failure (HCC)   . Allergic rhinitis   . Anxiety   . Cerebral palsy (HCC)   . Chronic airway obstruction (HCC)   . Chronic pain syndrome   . Depression   . DVT (deep vein thrombosis) in pregnancy (HCC)   . Dysphagia   . Dysphasia   . Epilepsy (HCC)   . Hiatal hernia   . Hyperlipidemia   . Hypertension   . Hypothyroidism   . Incontinent of feces 09-25-11   hx.  . Incontinent of urine 09-25-11   hx.  . Migraine   . Mild intellectual disabilities   . Morbid obesity (HCC) 09-25-11   requires Hoyer lift. pt. doesn't stand or ambulate.  . Muscle weakness   . Nephrolithiasis 04/05/2012  . Neurogenic bladder   . Pneumonia    hx of asp pneumonia 1/11-1/19/12  . Sacral decubitus ulcer    hx of  . Seizures (HCC)   . Sepsis(995.91)   . Sepsis(995.91)    hx of   . Unspecified psychosis   . Vitamin D deficiency    Past Surgical History:  Past Surgical History:  Procedure Laterality Date  . CYSTOSCOPY WITH URETEROSCOPY  11/07/2011   Procedure: CYSTOSCOPY WITH URETEROSCOPY;  Surgeon: Marcine MatarStephen Dahlstedt, MD;  Location: [facility];  Service: Urology;  Laterality: Right;  Removal of right double J stent, Insertion right double J stent  . NEPHROLITHOTOMY  10/07/2011   Procedure: NEPHROLITHOTOMY PERCUTANEOUS;  Surgeon: Marcine MatarStephen Dahlstedt, MD;  Location: [facility];  Service: Urology;  Laterality: Right;  kidney   . NEPHROLITHOTOMY  12/06/2011   Procedure: NEPHROLITHOTOMY PERCUTANEOUS;  Surgeon: Marcine MatarStephen Dahlstedt, MD;  Location: [facility];  Service: Urology;  Laterality: Right;   REPEAT PCNL   . STONE EXTRACTION WITH BASKET  11/07/2011   Procedure: STONE EXTRACTION  WITH BASKET;  Surgeon: Marcine MatarStephen Dahlstedt, MD;  Location: [facility];  Service: Urology;  Laterality: Right;   HPI:  51 yo adm from [facility] with seizures.  PMH + for CP, asp pna, functional quadriparesis - bedbound.  Swallow eval ordered.  Per review of soft chart, pt on puree/thin at SNF.  Pt has not had prior imaging swallow studies.    Assessment / Plan / Recommendation Clinical Impression  Pt presents with symptoms consistent with oral dysphagia due to CP - lingual thrusting, anterior spillage of liquids.  Pt overtly coughed with single ice chip =- concerning for premature spillage into pharynx/larynx prior to swallow trigger.  Swallow is delayed up to 11 seconds but increased efficiency noted with further intake.  As pt presents with ? aspiration pna, seizure  - recommend to modify diet to decrease aspiration risk.  Recommend puree/nectar with full supervision and esophageal precautions. Of note, pt does have C5-C6 osteophyte anterior that may further impair swallow.   SLP Visit Diagnosis: Dysphagia, oral phase (R13.11)    Aspiration Risk  Moderate aspiration risk    Diet Recommendation Dysphagia 1 (Puree);Nectar-thick liquid   Liquid Administration via: Cup;No straw Medication Administration: Whole meds with puree (crush if large and not contraindicated) Compensations: Slow rate;Small sips/bites;Minimize environmental distractions Postural Changes: Seated upright at 90 degrees;Remain upright for at least 30 minutes after po intake  Other  Recommendations Oral Care Recommendations: Oral care BID   Follow up Recommendations        Frequency and Duration min 1 x/week  1 week       Prognosis Prognosis for Safe Diet Advancement: Fair Barriers to Reach Goals: Cognitive deficits;Time post onset      Swallow Study   General Date of Onset: 09/30/16 HPI: 51 yo adm from [facility] with seizures.  PMH + for CP, asp pna, functional quadriparesis - bedbound.  Swallow  eval ordered.  Per review of soft chart, pt on puree/thin at SNF.  Pt has not had prior imaging swallow studies.  Type of Study: Bedside Swallow Evaluation Diet Prior to this Study: NPO Temperature Spikes Noted: No Respiratory Status: Nasal cannula History of Recent Intubation: No Behavior/Cognition: Alert;Requires cueing;Distractible (inconsistently follows directions) Oral Cavity Assessment: Lesions (appearance of lesion on left anterior tongue, ? due to seizure) Oral Care Completed by SLP: No Oral Cavity - Dentition: Edentulous Vision: Impaired for self-feeding Self-Feeding Abilities: Needs assist Patient Positioning: Partially reclined (pt appears kyphotic, difficult to position, head leaned toward left, max assist to hold to midline) Baseline Vocal Quality: Low vocal intensity Volitional Cough: Cognitively unable to elicit Volitional Swallow: Unable to elicit    Oral/Motor/Sensory Function Overall Oral Motor/Sensory Function: Generalized oral weakness (? mild right facial weakness, pt leans head toward left during session, ? dyskinesia - lingual )   Ice Chips Ice chips: Impaired Presentation: Spoon Oral Phase Functional Implications: Oral holding (suspect premature spillage of ice chip into pharynx) Pharyngeal Phase Impairments: Cough - Immediate   Thin Liquid Thin Liquid: Impaired Presentation: Cup;Spoon;Straw Oral Phase Impairments: Reduced lingual movement/coordination;Reduced labial seal Oral Phase Functional Implications: Prolonged oral transit;Left anterior spillage Pharyngeal  Phase Impairments: Suspected delayed Swallow    Nectar Thick Nectar Thick Liquid: Impaired Presentation: Cup Oral Phase Impairments: Reduced labial seal;Reduced lingual movement/coordination Oral phase functional implications: Prolonged oral transit;Other (comment) (lingual thrusting)   Honey Thick Honey Thick Liquid: Not tested   Puree Puree: Impaired Presentation: Spoon Oral Phase Impairments:  Reduced labial seal;Reduced lingual movement/coordination Oral Phase Functional Implications: Prolonged oral transit;Other (comment) (lingual thrusting)   Solid   GO   Solid: Not tested        Chales AbrahamsKimball, Damiyah Ditmars Ann 09/30/2016,12:27 PM Donavan Burnetamara Bobi Daudelin, MS [facility] SLP [phone]

## 2016-09-30 NOTE — Clinical Social Work Note (Signed)
Clinical Social Work Assessment  Patient Details  Name: Kristi Perry MRN: [id number] Date of Birth: 02/28/1966  Date of referral:  09/30/16               Reason for consult:  Facility Placement, Discharge Planning                Permission sought to share information with:  Case Manager Permission granted to share information::  Yes, Verbal Permission Granted  Name::        Agency::     Relationship::     Contact Information:     Housing/Transportation Living arrangements for the past 2 months:  Skilled Nursing Facility Source of Information:  Parent, Facility Patient Interpreter Needed:  None Criminal Activity/Legal Involvement Pertinent to Current Situation/Hospitalization:  No - Comment as needed Significant Relationships:  Parents Lives with:  Facility Resident Do you feel safe going back to the place where you live?  Yes Need for family participation in patient care:  Yes (Comment)  Care giving concerns:  No concerns reported at this time.   Social Worker assessment / plan:  [facility] with Post-ictal coma. Pt is now alert, oriented x1, and unable to participate in dc planning. CSW contacted pt's mother to confirm dc planning. [facility] and pt's mother would like pt to return to SNF at dc. [facility], clinicals sent for review, and dc plan confirmed. CSW will continue to follow to assist with d/c planning needs.  Employment status:  Disabled (Comment on whether or not currently receiving Disability) Insurance information:  Medicare PT Recommendations:  Not assessed at this time Information / Referral to community resources:     Patient/Family's Response to care:  Pt will return to SNF at dc.  Patient/Family's Understanding of and Emotional Response to Diagnosis, Current Treatment, and Prognosis:  Pt's mother has not yet spoken to MD. Dr. Linna DarnerAnwar attempted to contact pt's mother this am without  success. CSW alerted Dr. Linna DarnerAnwar that pt's mother was available by phone this afternoon. Pt 's mother is not feeling well and is unable to visit at this time.  Emotional Assessment Appearance:  Appears stated age Attitude/Demeanor/Rapport:  Other (no behaviors) Affect (typically observed):  Anxious Orientation:  Oriented to Self Alcohol / Substance use:  Not Applicable Psych involvement (Current and /or in the community):  No (Comment)  Discharge Needs  Concerns to be addressed:  Discharge Planning Concerns Readmission within the last 30 days:  No Current discharge risk:  None Barriers to Discharge:  No Barriers Identified   Kristi AsalHaidinger, Tammy Ericsson Lee, LCSW  [phone] 09/30/2016, 1:30 PM

## 2016-09-30 NOTE — Progress Notes (Signed)
Daily Progress Note   Patient Name: Kristi Perry Perry       Date: 09/30/2016 DOB: 07/24/1965  Age: 51 y.o. MRN#: [id number] Attending Physician: Leatha GildingGherghe, Costin M, MD Primary Care Physician: Angela Coxasanayaka, [name], MD Admit Date: 09/28/2016  Reason for Consultation/Follow-up: Establishing goals of care  Subjective:  much more awake alert Eyes open Resting in bed Is confused, tearful, " I don't want to be here by myself." I told Kristi Perry FailWendy that I would call her mom and let her know. See below:   Length of Stay: 1  Current Medications: Scheduled Meds:  . enoxaparin (LOVENOX) injection  40 mg Subcutaneous Q24H  . pneumococcal 23 valent vaccine  0.5 mL Intramuscular Tomorrow-1000    Continuous Infusions: . sodium chloride 75 mL/hr at 09/30/16 0631  . levETIRAcetam Stopped (09/29/16 2158)  . meropenem (MERREM) IV Stopped (09/30/16 0554)  . valproate sodium Stopped (09/30/16 0734)  . vancomycin Stopped (09/30/16 0214)    PRN Meds: acetaminophen, LORazepam  Physical Exam         Awake alert Mid acute distress S1 S2 Clear Abdomen soft Trace edema  Vital Signs: BP 136/78   Pulse (!) 108   Temp 98.7 F (37.1 C) (Oral)   Resp 16   Ht 4\' 9"  (1.448 m)   Wt 67.9 kg (149 lb 11.1 oz)   LMP  (LMP Unknown) Comment: pt unresponsive   SpO2 95%   BMI 32.39 kg/m  SpO2: SpO2: 95 % O2 Device: O2 Device: Nasal Cannula O2 Flow Rate: O2 Flow Rate (L/min): 3 L/min  Intake/output summary:  Intake/Output Summary (Last 24 hours) at 09/30/16 0948 Last data filed at 09/30/16 0645  Gross per 24 hour  Intake             3070 ml  Output             2000 ml  Net             1070 ml   LBM: Last BM Date:  (unknown) Baseline Weight: Weight: 81.6 kg (180 lb) Most recent weight: Weight: 67.9 kg (149 lb  11.1 oz)       Palliative Assessment/Data:    Flowsheet Rows     Most Recent Value  Intake Tab  Referral Department  Hospitalist  Unit at Time of Referral  Intermediate Care Unit  Palliative Care Primary Diagnosis  Other (Comment) [seizure, sepsis, pna, CP]  Palliative Care Type  New Palliative care  Reason for referral  Clarify Goals of Care  Date first seen by Palliative Care  09/29/16  Clinical Assessment  Palliative Performance Scale Score  10%  Pain Max last 24 hours  2  Pain Min Last 24 hours  1  Psychosocial & Spiritual Assessment  Palliative Care Outcomes  Patient/Family meeting held?  No  Palliative Care Outcomes  Other (Comment) [call placed, unable to reach mother ]      Patient Active Problem List   Diagnosis Date Noted  . Post-ictal coma (HCC) 09/29/2016  . Sepsis (HCC) 11/20/2013  . Unspecified hypothyroidism 11/20/2013  . Seizures (HCC) 11/20/2013  . Aspiration pneumonia (HCC) 11/20/2013  . UTI (urinary tract infection) 11/20/2013  . Acute respiratory failure with hypoxia (HCC) 11/19/2013  . Infection due to ESBL-producing Escherichia coli 04/07/2012  . Thyroid nodule 04/07/2012  . Nephrolithiasis 04/05/2012  . Acute lower UTI 04/03/2012  . HCAP (healthcare-associated pneumonia) 02/27/2012  . Cerebral palsy (HCC) 02/27/2012    Palliative Care Assessment & Plan   Patient Profile:  51 year old female with history of cerebral palsy, seizures, aspiration pneumonia who came in after prolonged diminished lung seizure at the SNF Assessment:  Post ictal coma and seizures HCAP causing acute respiratory failure with hypoxia Underlying cerebral palsy, bed bound state, functional quadriplegia, and seizure disorder Recommendations/Plan:   follow hospital course, monitor Po intake, monitor physical activity  Recommend SNF with palliative to follow on d/c  Nursing notes reviewed, patient's mother has requested we call her with updates, that due to health  condition, she is not able to come to the hospital, hence, call placed, at both home and cell numbers, unable to reach. I will give my number to bedside RN, if patient's mother calls the hospital, I would be happy to get in touch with her and update her regarding the patient's condition.    Code Status:    Code Status Orders        Start     Ordered   09/29/16 0258  Do not attempt resuscitation (DNR)  Continuous    Question Answer Comment  In the event of cardiac or respiratory ARREST Do not call a "code blue"   In the event of cardiac or respiratory ARREST Do not perform Intubation, CPR, defibrillation or ACLS   In the event of cardiac or respiratory ARREST Use medication by any route, position, wound care, and other measures to relive pain and suffering. May use oxygen, suction and manual treatment of airway obstruction as needed for comfort.      09/29/16 0305    Code Status History    Date Active Date Inactive Code Status Order ID Comments User Context   11/20/2013  1:13 AM 11/23/2013  4:06 PM DNR [id number]  Haydee Monicaavid, Rachal A, MD ED   04/03/2012  9:06 AM 04/07/2012  4:13 PM Full Code [id number]  Orlando Pennerraegbunam, Ifeoma Kate, RN Inpatient   02/28/2012  2:00 AM 03/02/2012  7:25 PM DNR [id number]  Rolan Lipaallahan, Thomas Michael, NP Inpatient   02/27/2012  6:14 PM 02/28/2012  2:00 AM Full Code [id number]  Zerita BoersWhitaker, Lonnie R, RN Inpatient    Advance Directive Documentation     Most Recent Value  Type of Advance Directive  Out of facility DNR (pink MOST or yellow form)  Pre-existing out of facility DNR order (yellow form or pink MOST form)  Yellow form  placed in chart (order not valid for inpatient use)  "MOST" Form in Place?  -       Prognosis:   guarded   Discharge Planning:  Recommend patient go back to her SNF with palliative on d/c.   Care plan was discussed with patient, Dr Elvera Lennox, bedside RN.  Call placed at both home and cell number for mother, unable to reach.   Thank you for allowing  the Palliative Medicine Team to assist in the care of this patient.   Time In: 9 Time Out: 9.25 Total Time 25 Prolonged Time Billed  no       Greater than 50%  of this time was spent counseling and coordinating care related to the above assessment and plan.  Rosalin Hawking, MD [phone]  Please contact Palliative Medicine Team phone at [phone] for questions and concerns.

## 2016-09-30 NOTE — Care Management Note (Signed)
Case Management Note  Patient Details  Name: Kristi Perry MRN: [id number] Date of Birth: 06/18/1965  Subjective/Objective:    Hx of functional quadriplegia, seizures, sepsis, poss. Asp. pna                Action/Plan: Date:  September 30, 2016 Chart reviewed for concurrent status and case management needs. Will continue to follow patient progress. Discharge Planning: following for needs Expected discharge date: [id number] Marcelle SmilingRhonda Davis, BSN, [place]3, [place]   [phone]  Expected Discharge Date:                  Expected Discharge Plan:  Home/Self Care  In-House Referral:     Discharge planning Services  CM Consult  Post Acute Care Choice:    Choice offered to:     DME Arranged:    DME Agency:     HH Arranged:    HH Agency:     Status of Service:  In process, will continue to follow  If discussed at Long Length of Stay Meetings, dates discussed:    Additional Comments:  Golda AcreDavis, Rhonda Lynn, RN 09/30/2016, 8:46 AM

## 2016-09-30 NOTE — Progress Notes (Signed)
Initial Nutrition Assessment  DOCUMENTATION CODES:   Obesity unspecified  INTERVENTION:  - Diet advancement as medically feasible.  - Will monitor for needs at time of RD follow-up.  NUTRITION DIAGNOSIS:   Inadequate oral intake related to inability to eat as evidenced by NPO status.  GOAL:   Patient will meet greater than or equal to 90% of their needs  MONITOR:   Diet advancement, Weight trends, Labs, Skin  REASON FOR ASSESSMENT:   Low Braden  ASSESSMENT:   51 y.o. female with medical history significant of seizures, CP, chronic pain syndrome, PNA and aspiration PNA.  Patient brought in to ED after apparent 20 min seizure at SNF.  Was given 2mg  ativan at SNF which didn't stop seizure.  Was given 2.5mg  versed by EMS which did stop seizure.  Patient is post-ictal and essentially unresponsive in ED and not able to contribute to history.  Pt seen for low Braden. BMI indicates obesity. Pt was unresponsive at time of admission on 7/28 and throughout the day yesterday. Per H&P on 7/28, pt NPO d/t GCS and staff did not feel comfortable attempting NGT/OGT placement d/t high risk of vomiting/aspiration. Pt is awake this AM although she remains confused. She is not a good historian and unable to provide any information. Pt has no teeth and is difficult to understand; RN reports that [facility] and staff there was unsure if pt has dentures or not. RN also reports plan for SLP evaluation today; will monitor for recommendations from SLP concerning diet.   Physical assessment shows mild/moderate muscle wasting to legs only. Both legs are contracted; suspect wasting d/t CP and RN reports pt is wheelchair-bound at baseline. No muscle or fat wasting to upper body. No weight hx in the chart since 11/20/13 when pt weighed 193 lbs. Based on current weight this indicates 44 lb weight loss (23% body weight) in ~3 years which is not significant for time frame. Will  monitor weight trends closely during hospitalization.  Medications reviewed; 10 mEq IC KCl x2 runs today.  Labs reviewed; CBGs: 112-155 mg/dL, K:3 mmol/L, Cl: 98 mmol/L, BUN: <5 mg/dL, creatinine: 9.620.42 mg/dL, Ca: 8.2 mg/dL.  IVF: NS @ 75 mL/hr.     Diet Order:  Diet NPO time specified  Skin:  Wound (see comment) (Stage 2 R buttocks pressure injury)  Last BM:  PTA/unknown  Height:   Ht Readings from Last 1 Encounters:  09/29/16 4\' 9"  (1.448 m)    Weight:   Wt Readings from Last 1 Encounters:  09/29/16 149 lb 11.1 oz (67.9 kg)    Ideal Body Weight:  41.45 kg  BMI:  Body mass index is 32.39 kg/m.  Estimated Nutritional Needs:   Kcal:  1495-1700 (22-25 kcal/kg)  Protein:  60-70 grams  Fluid:  >/= 1.6 L/day  EDUCATION NEEDS:   No education needs identified at this time    Trenton GammonJessica Kitiara Hintze, MS, RD, LDN, CNSC Inpatient Clinical Dietitian Pager # [phone] After hours/weekend pager # [phone]

## 2016-10-01 ENCOUNTER — Inpatient Hospital Stay (HOSPITAL_COMMUNITY)
Admit: 2016-10-01 | Discharge: 2016-10-01 | Disposition: A | Discharge status: Home / Self Care | Payer: Medicare Other | Attending: Internal Medicine | Admitting: Internal Medicine

## 2016-10-01 DIAGNOSIS — G40909 Epilepsy, unspecified, not intractable, without status epilepticus: Secondary | ICD-10-CM

## 2016-10-01 DIAGNOSIS — G894 Chronic pain syndrome: Secondary | ICD-10-CM

## 2016-10-01 LAB — CBC
HEMATOCRIT: 41 % (ref 36.0–46.0)
Hemoglobin: 14.2 g/dL (ref 12.0–15.0)
MCH: 30.7 pg (ref 26.0–34.0)
MCHC: 34.6 g/dL (ref 30.0–36.0)
MCV: 88.6 fL (ref 78.0–100.0)
PLATELETS: 183 10*3/uL (ref 150–400)
RBC: 4.63 MIL/uL (ref 3.87–5.11)
RDW: 13.1 % (ref 11.5–15.5)
WBC: 9.8 10*3/uL (ref 4.0–10.5)

## 2016-10-01 LAB — BASIC METABOLIC PANEL
Anion gap: 9 (ref 5–15)
BUN: 6 mg/dL (ref 6–20)
CHLORIDE: 101 mmol/L (ref 101–111)
CO2: 33 mmol/L — AB (ref 22–32)
CREATININE: 0.34 mg/dL — AB (ref 0.44–1.00)
Calcium: 8.8 mg/dL — ABNORMAL LOW (ref 8.9–10.3)
GFR calc Af Amer: >60 mL/min (ref >60)
GFR calc non Af Amer: >60 mL/min (ref >60)
Glucose, Bld: 117 mg/dL — ABNORMAL HIGH (ref 65–99)
POTASSIUM: 3 mmol/L — AB (ref 3.5–5.1)
Sodium: 143 mmol/L (ref 135–145)

## 2016-10-01 LAB — VALPROIC ACID LEVEL: Valproic Acid Lvl: 18 ug/mL — ABNORMAL LOW (ref 50.0–100.0)

## 2016-10-01 LAB — URINE CULTURE: Culture: >=100000 — AB

## 2016-10-01 MED ORDER — SODIUM CHLORIDE 0.9 % IV SOLN
1000.0000 mg | Freq: Two times a day (BID) | INTRAVENOUS | Status: DC
  Administered 2016-10-01 – 2016-10-06 (×11): 1000 mg via INTRAVENOUS
  Filled 2016-10-01 (×12): qty 10

## 2016-10-01 MED ORDER — FENTANYL 25 MCG/HR TD PT72
25.0000 ug | MEDICATED_PATCH | TRANSDERMAL | Status: DC
  Administered 2016-10-01 – 2016-10-10 (×4): 25 ug via TRANSDERMAL
  Filled 2016-10-01 (×4): qty 1

## 2016-10-01 MED ORDER — FENTANYL CITRATE (PF) 100 MCG/2ML IJ SOLN
12.5000 ug | INTRAMUSCULAR | Status: DC | PRN
  Administered 2016-10-05 – 2016-10-06 (×5): 25 ug via INTRAVENOUS
  Filled 2016-10-01 (×5): qty 2

## 2016-10-01 MED ORDER — LABETALOL HCL 5 MG/ML IV SOLN
5.0000 mg | INTRAVENOUS | Status: DC | PRN
  Administered 2016-10-01: 5 mg via INTRAVENOUS
  Filled 2016-10-01 (×2): qty 4

## 2016-10-01 MED ORDER — FENTANYL CITRATE (PF) 100 MCG/2ML IJ SOLN: 12.5000 ug | INTRAMUSCULAR | Status: DC | PRN

## 2016-10-01 MED ORDER — LAMOTRIGINE 100 MG PO TABS: 100.0000 mg | ORAL_TABLET | Freq: Two times a day (BID) | ORAL | Status: DC

## 2016-10-01 NOTE — Progress Notes (Addendum)
PROGRESS NOTE  Kristi LabradorWendy R Michna ZOX:[id number] DOB: 05/04/1965 DOA: 09/28/2016 PCP: Angela Coxasanayaka, [name], MD   LOS: 2 days   Brief Narrative / Interim history: 51 year old female with history of cerebral palsy, seizures, aspiration pneumonia who came in after prolonged seizure activity at the SNF.  She was admitted to the hospital in guarded prognosis being comatose and unresponsive, placed in stepdown and started on IV antiepileptics.  Patient was initially comatose, and it was felt like she will not survive the first 24 hours.  Palliative care was consulted at that time.  With antiepileptics IV as well as antibiotics, patient's mental status slowly improved and she is approaching baseline.  Assessment & Plan: Principal Problem:   Post-ictal coma (HCC) Active Problems:   HCAP (healthcare-associated pneumonia)   Acute lower UTI   Acute respiratory failure with hypoxia (HCC)   Sepsis (HCC)   Seizures (HCC)   Post ictal coma and seizures -Seizure activity appears to have stopped, and her mental status is slowly returning to baseline -She appears to be improving today, she is awake and asking for her mom -Speech therapy evaluated patient and currently she is on dysphagia 1 diet. -We will resume home Lamictal and continue Keppra and Depacon and IV forms for now  Addendum: Patient with likely another seizure episode this afternoon, no seizure activity noted now, but appears post ictal. Will d/c diet and po meds. Obtain an EEG  Sepsis due to HCAP causing acute respiratory failure with hypoxia -Broad-spectrum antibiotics for now with meropenem -MRSA PCR was negative, discontinued vancomycin  UTI -Continue meropenem, urine cultures with E. coli which is found to be ESBL, continue meropenem.  Today is day #3, will probably need 7-10 days  Underlying cerebral palsy, bed bound state, functional quadriplegia, and seizure disorder -guarded prognosis overall -Palliative care consulted,  appreciate input  Hypertension -More hypertensive and tachycardic today, discussed with Dr. Neale BurlyFreeman from palliative, he feels like may be related to pain as patient is on fentanyl patch at home which was discontinued on admission due to comatose state, agree with resumption of her fentanyl patch.  If blood pressure continues to be elevated may need to be on antihypertensive medications.  At home she is on Lasix.   -For now use IV labetalol as needed  DVT prophylaxis: Lovenox Code Status: Full code Family Communication: attempted to call mother Disposition Plan: home when ready   Consultants:   Palliative care  Procedures:   None  Antimicrobials:  Vancomycin 7/29 >> 7/30   Meropenem 7/29 >>  Subjective: -Alert, awake, does interact with me and answers basic questions  Objective: Vitals:   10/01/16 0319 10/01/16 0400 10/01/16 0800 10/01/16 0900  BP:  (!) 163/88 (!) 171/90 (!) 193/114  Pulse:  79 92 (!) 120  Resp:  (!) 22 (!) 34 18  Temp: 98.5 F (36.9 C)  98.1 F (36.7 C)   TempSrc: Oral  Oral   SpO2:  97% 95% (!) 86%  Weight:      Height:        Intake/Output Summary (Last 24 hours) at 10/01/16 1140 Last data filed at 10/01/16 0904  Gross per 24 hour  Intake             2630 ml  Output             1250 ml  Net             1380 ml   Filed Weights   09/29/16 0120 09/29/16 1200  Weight: 81.6 kg (180 lb) 67.9 kg (149 lb 11.1 oz)    Examination:  Vitals:   10/01/16 0319 10/01/16 0400 10/01/16 0800 10/01/16 0900  BP:  (!) 163/88 (!) 171/90 (!) 193/114  Pulse:  79 92 (!) 120  Resp:  (!) 22 (!) 34 18  Temp: 98.5 F (36.9 C)  98.1 F (36.7 C)   TempSrc: Oral  Oral   SpO2:  97% 95% (!) 86%  Weight:      Height:        Constitutional: NAD Eyes: PERRL, lids and conjunctivae normal ENMT: Mucous membranes are moist.  Respiratory: clear to auscultation bilaterally, no wheezing, no crackles. Normal respiratory effort.  Cardiovascular: Regular rate and rhythm,  no murmurs / rubs / gallops. No LE edema.  Abdomen: no tenderness. Bowel sounds positive.  Neurologic: cerebral palsy   Data Reviewed: I have independently reviewed following labs and imaging studies   CBC:  Recent Labs Lab 09/29/16 0029 09/29/16 0630 09/30/16 0355 10/01/16 0312  WBC 27.4* 28.6* 16.1* 9.8  NEUTROABS 21.3*  --   --   --   HGB 17.2* 16.5* 15.6* 14.2  HCT 50.0* 49.0* 44.8 41.0  MCV 91.7 95.0 89.8 88.6  PLT 365 239 200 183   Basic Metabolic Panel:  Recent Labs Lab 09/29/16 0029 09/29/16 0630 09/30/16 0355 10/01/16 0312  NA 143 143 136 143  K 2.8* 3.6 3.0* 3.0*  CL 102 107 98* 101  CO2 25 29 26  33*  GLUCOSE 222* 160* 114* 117*  BUN 12 8 <5* 6  CREATININE 0.94 0.67 0.42* 0.34*  CALCIUM 9.5 7.1* 8.2* 8.8*   GFR: Estimated Creatinine Clearance: 66.8 mL/min (A) (by C-G formula based on SCr of 0.34 mg/dL (L)). Liver Function Tests:  Recent Labs Lab 09/29/16 0029  AST 39  ALT 25  ALKPHOS 130*  BILITOT 0.6  PROT 7.5  ALBUMIN 4.3   No results for input(s): LIPASE, AMYLASE in the last 168 hours. No results for input(s): AMMONIA in the last 168 hours. Coagulation Profile:  Recent Labs Lab 09/29/16 0029  INR 1.39   Cardiac Enzymes: No results for input(s): CKTOTAL, CKMB, CKMBINDEX, TROPONINI in the last 168 hours. BNP (last 3 results) No results for input(s): PROBNP in the last 8760 hours. HbA1C: No results for input(s): HGBA1C in the last 72 hours. CBG:  Recent Labs Lab 09/28/16 2340 09/29/16 0750 09/29/16 0812 09/29/16 1227 09/29/16 1638  GLUCAP 217* 155* 148* 134* 112*   Lipid Profile: No results for input(s): CHOL, HDL, LDLCALC, TRIG, CHOLHDL, LDLDIRECT in the last 72 hours. Thyroid Function Tests: No results for input(s): TSH, T4TOTAL, FREET4, T3FREE, THYROIDAB in the last 72 hours. Anemia Panel: No results for input(s): VITAMINB12, FOLATE, FERRITIN, TIBC, IRON, RETICCTPCT in the last 72 hours. Urine analysis:    Component  Value Date/Time   COLORURINE YELLOW 09/29/2016 0054   APPEARANCEUR CLOUDY (A) 09/29/2016 0054   APPEARANCEUR Hazy 03/18/2012 1233   LABSPEC 1.012 09/29/2016 0054   LABSPEC 1.006 03/18/2012 1233   PHURINE 5.0 09/29/2016 0054   GLUCOSEU 50 (A) 09/29/2016 0054   GLUCOSEU Negative 03/18/2012 1233   HGBUR SMALL (A) 09/29/2016 0054   BILIRUBINUR NEGATIVE 09/29/2016 0054   BILIRUBINUR Negative 03/18/2012 1233   KETONESUR NEGATIVE 09/29/2016 0054   PROTEINUR 100 (A) 09/29/2016 0054   UROBILINOGEN 0.2 11/19/2013 2230   NITRITE NEGATIVE 09/29/2016 0054   LEUKOCYTESUR LARGE (A) 09/29/2016 0054   LEUKOCYTESUR 3+ 03/18/2012 1233   Sepsis Labs: Invalid input(s): PROCALCITONIN, LACTICIDVEN  Recent Results (from the past 240 hour(s))  Culture, blood (Routine x 2)     Status: None (Preliminary result)   Collection Time: 09/29/16 12:27 AM  Result Value Ref Range Status   Specimen Description BLOOD LEFT HAND  Final   Special Requests   Final    BOTTLES DRAWN AEROBIC AND ANAEROBIC Blood Culture adequate volume   Culture   Final    NO GROWTH 1 DAY Performed at [facility], [address] [place], [place] [address]    Report Status PENDING  Incomplete  Culture, blood (Routine x 2)     Status: None (Preliminary result)   Collection Time: 09/29/16 12:27 AM  Result Value Ref Range Status   Specimen Description BLOOD RIGHT ANTECUBITAL  Final   Special Requests   Final    BOTTLES DRAWN AEROBIC AND ANAEROBIC Blood Culture adequate volume   Culture   Final    NO GROWTH 1 DAY Performed at [facility], [address] [place], [place] [address]    Report Status PENDING  Incomplete  Urine culture     Status: Abnormal   Collection Time: 09/29/16 12:53 AM  Result Value Ref Range Status   Specimen Description URINE, CATHETERIZED  Final   Special Requests NONE  Final   Culture (A)  Final    >=100,000 COLONIES/mL ESCHERICHIA COLI Confirmed Extended Spectrum Beta-Lactamase Producer  (ESBL) Performed at [facility], [address] [place], [place] [address]    Report Status 10/01/2016 FINAL  Final   Organism ID, Bacteria ESCHERICHIA COLI (A)  Final      Susceptibility   Escherichia coli - MIC*    AMPICILLIN >=32 RESISTANT Resistant     CEFAZOLIN >=64 RESISTANT Resistant     CEFTRIAXONE >=64 RESISTANT Resistant     CIPROFLOXACIN >=4 RESISTANT Resistant     GENTAMICIN <=1 SENSITIVE Sensitive     IMIPENEM <=0.25 SENSITIVE Sensitive     NITROFURANTOIN <=16 SENSITIVE Sensitive     TRIMETH/SULFA <=20 SENSITIVE Sensitive     AMPICILLIN/SULBACTAM 8 SENSITIVE Sensitive     PIP/TAZO <=4 SENSITIVE Sensitive     Extended ESBL POSITIVE Resistant     * >=100,000 COLONIES/mL ESCHERICHIA COLI  MRSA PCR Screening     Status: None   Collection Time: 09/29/16  5:23 AM  Result Value Ref Range Status   MRSA by PCR NEGATIVE NEGATIVE Final    Comment:        The GeneXpert MRSA Assay (FDA approved for NASAL specimens only), is one component of a comprehensive MRSA colonization surveillance program. It is not intended to diagnose MRSA infection nor to guide or monitor treatment for MRSA infections.       Radiology Studies: No results found. Scheduled Meds: . chlorhexidine  15 mL Mouth Rinse BID  . enoxaparin (LOVENOX) injection  40 mg Subcutaneous Q24H  . fentaNYL  25 mcg Transdermal Q72H  . mouth rinse  15 mL Mouth Rinse q12n4p   Continuous Infusions: . sodium chloride 75 mL/hr at 09/30/16 2312  . levETIRAcetam 500 mg (10/01/16 0904)  . meropenem (MERREM) IV Stopped (10/01/16 0543)  . valproate sodium Stopped (10/01/16 0700)    Pamella Pertostin Gianna Calef, MD, PhD [facility] Pager [phone]  If 7PM-7AM, please contact night-coverage [email or web address] Password [email or web address] 10/01/2016, 11:40 AM

## 2016-10-01 NOTE — Progress Notes (Addendum)
Daily Progress Note   Patient Name: Kristi Perry       Date: 10/01/2016 DOB: 04/23/1965  Age: 51 y.o. MRN#: [id number] Attending Physician: Leatha GildingGherghe, Costin M, MD Primary Care Physician: Angela Coxasanayaka, [name], MD Admit Date: 09/28/2016  Reason for Consultation/Follow-up: Establishing goals of care  Subjective: awake alert Eyes open Resting in bed Responds yes when asked about pain.  Groans during skin exam (while checking for fentanyl patch). See below:   Length of Stay: 2  Current Medications: Scheduled Meds:  . chlorhexidine  15 mL Mouth Rinse BID  . enoxaparin (LOVENOX) injection  40 mg Subcutaneous Q24H  . mouth rinse  15 mL Mouth Rinse q12n4p    Continuous Infusions: . sodium chloride 75 mL/hr at 09/30/16 2312  . levETIRAcetam 500 mg (10/01/16 0904)  . meropenem (MERREM) IV Stopped (10/01/16 0543)  . valproate sodium Stopped (10/01/16 0700)    PRN Meds: acetaminophen, labetalol, LORazepam, RESOURCE THICKENUP CLEAR  Physical Exam         Awake alert Mild distress with movement S1 S2 Clear Abdomen soft Trace edema  Vital Signs: BP (!) 193/114   Pulse (!) 120   Temp 98.1 F (36.7 C) (Oral)   Resp 18   Ht 4\' 9"  (1.448 m)   Wt 67.9 kg (149 lb 11.1 oz)   LMP  (LMP Unknown) Comment: pt unresponsive   SpO2 (!) 86%   BMI 32.39 kg/m  SpO2: SpO2: (!) 86 % O2 Device: O2 Device: Nasal Cannula O2 Flow Rate: O2 Flow Rate (L/min): 2 L/min  Intake/output summary:   Intake/Output Summary (Last 24 hours) at 10/01/16 0945 Last data filed at 10/01/16 [id number]  Gross per 24 hour  Intake             3045 ml  Output             1650 ml  Net             1395 ml   LBM: Last BM Date:  (unknown) Baseline Weight: Weight: 81.6 kg (180 lb) Most recent weight: Weight: 67.9 kg (149  lb 11.1 oz)       Palliative Assessment/Data:    Flowsheet Rows     Most Recent Value  Intake Tab  Referral Department  Hospitalist  Unit at Time of Referral  Intermediate Care Unit  Palliative Care Primary Diagnosis  Other (Comment) [seizure, sepsis, pna, CP]  Palliative Care Type  New Palliative care  Reason for referral  Clarify Goals of Care  Date first seen by Palliative Care  09/29/16  Clinical Assessment  Palliative Performance Scale Score  10%  Pain Max last 24 hours  2  Pain Min Last 24 hours  1  Psychosocial & Spiritual Assessment  Palliative Care Outcomes  Patient/Family meeting held?  No  Palliative Care Outcomes  Other (Comment) [call placed, unable to reach mother ]      Patient Active Problem List   Diagnosis Date Noted  . Post-ictal coma (HCC) 09/29/2016  . Sepsis (HCC) 11/20/2013  . Unspecified hypothyroidism 11/20/2013  . Seizures (HCC) 11/20/2013  . Aspiration pneumonia (HCC) 11/20/2013  . UTI (urinary tract infection) 11/20/2013  . Acute respiratory failure with hypoxia (HCC) 11/19/2013  . Infection due to ESBL-producing Escherichia coli 04/07/2012  . Thyroid nodule 04/07/2012  . Nephrolithiasis 04/05/2012  . Acute lower UTI 04/03/2012  . HCAP (healthcare-associated pneumonia) 02/27/2012  . Cerebral palsy (HCC) 02/27/2012    Palliative Care Assessment & Plan   Patient Profile:  51 year old female with history of cerebral palsy, seizures, aspiration pneumonia who came in after prolonged diminished lung seizure at the SNF Assessment:  Post ictal coma and seizures HCAP causing acute respiratory failure with hypoxia Underlying cerebral palsy, bed bound state, functional quadriplegia, and seizure disorder Recommendations/Plan:  Follow hospital course, monitor Po intake, monitor physical activity.   Recommend SNF with palliative to follow on d/c.  Await call back from mother to discuss.  Patient with elevated BP, HR today.  She does respond  "yes" when asked about pain.  [facility] reviewed and she is document to be on 2825mcg/hr fentanyl patch.  Her bedside RN and I did skin check with no patch found today.  Will plan for addition of fentanyl 12.5-25mcg Q 1 hour as needed and consider restart patch.  Will d/w attending.   Code Status:    Code Status Orders        Start     Ordered   09/29/16 0258  Do not attempt resuscitation (DNR)  Continuous    Question Answer Comment  In the event of cardiac or respiratory ARREST Do not call a "code blue"   In the event of cardiac or respiratory ARREST Do not perform Intubation, CPR, defibrillation or ACLS   In the event of cardiac or respiratory ARREST Use medication by any route, position, wound care, and other measures to relive pain and suffering. May use oxygen, suction and manual treatment of airway obstruction as needed for comfort.      09/29/16 0305    Code Status History    Date Active Date Inactive Code Status Order ID Comments User Context   11/20/2013  1:13 AM 11/23/2013  4:06 PM DNR [id number]  Haydee Monicaavid, Rachal A, MD ED   04/03/2012  9:06 AM 04/07/2012  4:13 PM Full Code [id number]  Orlando Pennerraegbunam, Ifeoma Kate, RN Inpatient   02/28/2012  2:00 AM 03/02/2012  7:25 PM DNR [id number]  Rolan Lipaallahan, Thomas Michael, NP Inpatient   02/27/2012  6:14 PM 02/28/2012  2:00 AM Full Code [id number]  Zerita BoersWhitaker, Lonnie R, RN Inpatient    Advance Directive Documentation     Most Recent Value  Type of Advance Directive  Out of facility DNR (pink MOST or yellow form)  Pre-existing out of facility DNR order (yellow form or pink MOST  form)  Yellow form placed in chart (order not valid for inpatient use)  "MOST" Form in Place?  -       Prognosis:   guarded   Discharge Planning:  Recommend patient go back to her SNF with palliative on d/c.   Care plan was discussed with patient, Dr Elvera Lennox, bedside RN.   Thank you for allowing the Palliative Medicine Team to assist in the care of this  patient.   Time In: 0905 Time Out: 0950 Total Time 45 Prolonged Time Billed  no       Greater than 50%  of this time was spent counseling and coordinating care related to the above assessment and plan.  Romie Minus, MD  Please contact Palliative Medicine Team phone at [phone] for questions and concerns.    Addendum: D/w Dr. Elvera Lennox.  He is in agreement with plan to restart home dose fentanyl patch at 48mcg/hr.  Order placed.  Romie Minus, MD [facility] Palliative Medicine Team [phone]

## 2016-10-01 NOTE — Progress Notes (Signed)
Approached patient to bath her with NT. Patient is unresponsive to all stimuli. NT stated she was alert and talking earlier this am. Suspect post ictal state. Bedside RN made aware. Labetalol 5 mg given for increased SBP.

## 2016-10-01 NOTE — Progress Notes (Signed)
EEG completed; results pending.    

## 2016-10-01 NOTE — Progress Notes (Signed)
SLP Cancellation Note  Patient Details Name: Kristi Perry MRN: [id number] DOB: 06/14/1965   Cancelled treatment:       Reason Eval/Treat Not Completed: Fatigue/lethargy limiting ability to participate (pt sleeping/lethargic, will follow up next date)   Donavan Burnetamara Zamara Cozad, MS [facility] SLP [phone]

## 2016-10-01 NOTE — Procedures (Signed)
ELECTROENCEPHALOGRAM REPORT  Date of Study: 10/01/2016  Patient's Name: [name] MRN: [id number] Date of Birth: 07-14-1965  Referring Provider: Dr. Pamella Pertostin Gherghe  Clinical History: This is a 51 year old woman with cerebral palsy, seizures, unresponsive.   Medications: valproate (DEPACON) 500 mg in dextrose 5 % 50 mL IVPB  levETIRAcetam (KEPPRA) 500 mg in sodium chloride 0.9 % 100 mL IVPB  enoxaparin (LOVENOX) injection 40 mg  fentaNYL (DURAGESIC - dosed mcg/hr) patch 25 mcg  fentaNYL (SUBLIMAZE) injection 12.5-25 mcg  labetalol (NORMODYNE,TRANDATE) injection 5 mg  LORazepam (ATIVAN) injection 2 mg  MEDLINE mouth rinse  meropenem (MERREM) 1 g in sodium chloride 0.9 % 100 mL IVPB   Technical Summary: A multichannel digital EEG recording measured by the international 10-20 system with electrodes applied with paste and impedances below 5000 ohms performed as portable with EKG monitoring in an unresponsive patient.  Hyperventilation and photic stimulation were not performed.  The digital EEG was referentially recorded, reformatted, and digitally filtered in a variety of bipolar and referential montages for optimal display.   Description: The patient is unresponsive during the recording. She is noted to have chewing motions, dysconjugate gaze. There is no clear posterior dominant rhythm. There is background asymmetry noted, with delta>theta slowing over the left temporal region. There is theta and delta slowing as well over the right hemisphere, admixed with alpha and beta activity. There are occasional low to medium voltage sharp waves over the left temporal region. Normal sleep architecture is not seen. No electrographic correlate seen with chewing movements or dysconjugate gaze. No electrographic seizures seen.    EKG lead showed sinus tachycardia.  Impression: This EEG is abnormal due to the presence of: 1. Background slowing with asymmetry seen, left greater than right 2.  Occasional epileptiform discharges over the left temporal region  Clinical Correlation of the above findings indicates bilateral cerebral dysfunction that is non-specific in etiology and can be seen with hypoxic/ischemic injury, toxic/metabolic encephalopathies, neurodegenerative disorders, or medication effect. Asymmetry noted indicates focal cerebral dysfunction over the left temporal region suggestive of underlying structural or physiologic abnormality, or post-ictal change. There is a tendency for seizures to arise from the left temporal region. No electrographic seizures in this study.   Patrcia DollyKaren Aquino, M.D.

## 2016-10-02 ENCOUNTER — Inpatient Hospital Stay (HOSPITAL_COMMUNITY): Payer: Medicare Other

## 2016-10-02 DIAGNOSIS — A419 Sepsis, unspecified organism: Principal | ICD-10-CM

## 2016-10-02 DIAGNOSIS — L8990 Pressure ulcer of unspecified site, unspecified stage: Secondary | ICD-10-CM | POA: Insufficient documentation

## 2016-10-02 LAB — BASIC METABOLIC PANEL
ANION GAP: 8 (ref 5–15)
ANION GAP: 6 (ref 5–15)
BUN: 6 mg/dL (ref 6–20)
BUN: 6 mg/dL (ref 6–20)
CALCIUM: 8.2 mg/dL — AB (ref 8.9–10.3)
CHLORIDE: 99 mmol/L — AB (ref 101–111)
CO2: 33 mmol/L — ABNORMAL HIGH (ref 22–32)
CO2: 38 mmol/L — ABNORMAL HIGH (ref 22–32)
Calcium: 8.6 mg/dL — ABNORMAL LOW (ref 8.9–10.3)
Chloride: 100 mmol/L — ABNORMAL LOW (ref 101–111)
Creatinine, Ser: 0.34 mg/dL — ABNORMAL LOW (ref 0.44–1.00)
Creatinine, Ser: <0.3 mg/dL — ABNORMAL LOW (ref 0.44–1.00)
GLUCOSE: 94 mg/dL (ref 65–99)
Glucose, Bld: 84 mg/dL (ref 65–99)
POTASSIUM: 3.5 mmol/L (ref 3.5–5.1)
Potassium: 2.9 mmol/L — ABNORMAL LOW (ref 3.5–5.1)
Sodium: 141 mmol/L (ref 135–145)
Sodium: 143 mmol/L (ref 135–145)

## 2016-10-02 LAB — CBC
HCT: 37.8 % (ref 36.0–46.0)
HEMOGLOBIN: 12.7 g/dL (ref 12.0–15.0)
MCH: 30.8 pg (ref 26.0–34.0)
MCHC: 33.6 g/dL (ref 30.0–36.0)
MCV: 91.5 fL (ref 78.0–100.0)
PLATELETS: 162 10*3/uL (ref 150–400)
RBC: 4.13 MIL/uL (ref 3.87–5.11)
RDW: 13.6 % (ref 11.5–15.5)
WBC: 7.3 10*3/uL (ref 4.0–10.5)

## 2016-10-02 LAB — HEPATIC FUNCTION PANEL
ALBUMIN: 3 g/dL — AB (ref 3.5–5.0)
ALK PHOS: 54 U/L (ref 38–126)
ALT: 19 U/L (ref 14–54)
AST: 17 U/L (ref 15–41)
BILIRUBIN DIRECT: 0.1 mg/dL (ref 0.1–0.5)
Indirect Bilirubin: 0.5 mg/dL (ref 0.3–0.9)
TOTAL PROTEIN: 5.2 g/dL — AB (ref 6.5–8.1)
Total Bilirubin: 0.6 mg/dL (ref 0.3–1.2)

## 2016-10-02 LAB — MAGNESIUM: Magnesium: 1.8 mg/dL (ref 1.7–2.4)

## 2016-10-02 MED ORDER — PIPERACILLIN-TAZOBACTAM 3.375 G IVPB
3.3750 g | Freq: Three times a day (TID) | INTRAVENOUS | Status: DC
  Administered 2016-10-02 – 2016-10-06 (×11): 3.375 g via INTRAVENOUS
  Filled 2016-10-02 (×12): qty 50

## 2016-10-02 MED ORDER — POTASSIUM CHLORIDE CRYS ER 20 MEQ PO TBCR: 40.0000 meq | EXTENDED_RELEASE_TABLET | ORAL | Status: AC

## 2016-10-02 MED ORDER — POTASSIUM CHLORIDE IN NACL 20-0.9 MEQ/L-% IV SOLN
INTRAVENOUS | Status: DC
  Administered 2016-10-02 – 2016-10-08 (×9): via INTRAVENOUS
  Filled 2016-10-02 (×13): qty 1000

## 2016-10-02 MED ORDER — FOSFOMYCIN TROMETHAMINE 3 G PO PACK
3.0000 g | PACK | Freq: Once | ORAL | Status: AC
  Administered 2016-10-02: 3 g via ORAL
  Filled 2016-10-02: qty 3

## 2016-10-02 MED ORDER — LAMOTRIGINE 100 MG PO TABS
100.0000 mg | ORAL_TABLET | Freq: Two times a day (BID) | ORAL | Status: DC
  Administered 2016-10-02 – 2016-10-10 (×15): 100 mg via ORAL
  Filled 2016-10-02 (×15): qty 1

## 2016-10-02 MED ORDER — POTASSIUM CHLORIDE 10 MEQ/100ML IV SOLN
10.0000 meq | INTRAVENOUS | Status: AC
  Administered 2016-10-02 (×2): 10 meq via INTRAVENOUS
  Filled 2016-10-02 (×2): qty 100

## 2016-10-02 MED ORDER — CLONAZEPAM 0.5 MG PO TABS
0.5000 mg | ORAL_TABLET | Freq: Every day | ORAL | Status: DC
  Administered 2016-10-03 – 2016-10-10 (×7): 0.5 mg via ORAL
  Filled 2016-10-02 (×6): qty 1

## 2016-10-02 NOTE — Progress Notes (Signed)
Pharmacy Antibiotic Brief Note  See note from pharmacist earlier today  Orders for pharmacy to dose zosyn for aspiration pneumonia.  Recommendation accepted to change meropenem to zosyn and give fosfomycin x 1 dose to treat organism in urine cx.   Plan:  Zosyn 3.375gm IV q8h over 4h infusion  Pharmacy will follow at a distance  Juliette Alcideustin Theador Jezewski, PharmD, BCPS.   Pager: [phone] 10/02/2016 5:31 PM

## 2016-10-02 NOTE — Progress Notes (Signed)
PROGRESS NOTE  Kristi Perry [id number] DOB: 08/04/1965 DOA: 09/28/2016 PCP: Angela Coxasanayaka, [name], MD   LOS: 3 days   Brief Narrative / Interim history: 51 year old female with history of cerebral palsy, seizures, aspiration pneumonia who came in after prolonged seizure activity at the SNF.  She was admitted to the hospital in guarded prognosis being comatose and unresponsive, placed in stepdown and started on IV antiepileptics.  Patient was initially comatose, and it was felt like she will not survive the first 24 hours.  Palliative care was consulted at that time.  With antiepileptics IV as well as antibiotics, patient's mental status slowly improved and she is approaching baseline.  Assessment & Plan: Principal Problem:   Post-ictal coma (HCC) Active Problems:   HCAP (healthcare-associated pneumonia)   Acute lower UTI   Acute respiratory failure with hypoxia (HCC)   Sepsis (HCC)   Seizures (HCC)   Pressure injury of skin  Post ictal coma and seizures- mental status appears to have declined today. No obvious seizures. Patient not responding verbally but eyes open, sometimes tracks with her eyes. - Neurology recommendations appreciated- NG tube ordered, home amitriptyline and clonazepam re-ordered - Continue Keppra and Depacon and IV forms for now - Pt DNR, transfer off ICU.  Sepsis due to HCAP causing acute respiratory failure with hypoxia. Fever yesterday afternoon. - Blood cultures still negative - Agree with pharmacy recommendations to DC IV meropenem 7/29 >>8/1. As carbapenems lower seizure threshold - Start IV antibiotic Zosyn per pharmacy 8/1 >> - cover possible aspiration pneumonia and HCAP. - Repeat blood cultures if patient spikes fever, >72 on antibiotics, consider repeat chest x-ray. - MRSA negative final discontinued  UTI - Will d/c meropenem, now on Unasyn -  Urine cultures growing ESBL Ecoli. Completed #3 days of meropenem. Will give Fosfomycin X1 - Pharmacist  notes- unasyn and Zosyn not cover ESBL in urine despite repeat that the sensitivities, recommended fosfomycin 1 dose to complete treatment .  Underlying cerebral palsy, bed bound state, functional quadriplegia, and seizure disorder -guarded prognosis overall -Palliative care consulted, appreciate input, home fentanyl patch restarted today, this was after change in mental status - long term Resident of nursing home of nursing home   Hypertension- stable  -For now use IV labetalol as needed  DVT prophylaxis: Lovenox Code Status: Full code Family Communication: attempted to call mother 7/31 Disposition Plan: home when ready   Consultants:   Palliative care  Neurology   Procedures:   None  Antimicrobials:  Vancomycin 7/29 >> 7/30   Meropenem 7/29 >> 8/1  Zosyn 8/1 >>  Subjective: Awake. Sometimes tracks with eyes. Appears lethargic . Not following directions.   Objective: Vitals:   10/02/16 0400 10/02/16 0800 10/02/16 1200 10/02/16 1626  BP: 133/66  133/60 (!) 141/72  Pulse: 76  70 (!) 102  Resp: (!) 23  16 16   Temp:  100 F (37.8 C) 98.7 F (37.1 C) 99.1 F (37.3 C)  TempSrc:  Oral Oral Oral  SpO2: 97%  97% 97%  Weight:      Height:        Intake/Output Summary (Last 24 hours) at 10/02/16 1705 Last data filed at 10/02/16 1321  Gross per 24 hour  Intake          1181.25 ml  Output             1825 ml  Net          -643.75 ml   [employer] Power[employer]iled Weights   09/29/16  0120 09/29/16 1200  Weight: 81.6 kg (180 lb) 67.9 kg (149 lb 11.1 oz)    Examination:  Vitals:   10/02/16 0400 10/02/16 0800 10/02/16 1200 10/02/16 1626  BP: 133/66  133/60 (!) 141/72  Pulse: 76  70 (!) 102  Resp: (!) 23  16 16   Temp:  100 F (37.8 C) 98.7 F (37.1 C) 99.1 F (37.3 C)  TempSrc:  Oral Oral Oral  SpO2: 97%  97% 97%  Weight:      Height:       Constitutional: NAD Eyes: PERRL, lids and conjunctivae normal ENMT: Mucous membranes- Not examined due to mental status Respiratory:  clear to auscultation bilaterally- anterior auscultation, no wheezing, no crackles. Normal respiratory effort.  Cardiovascular: Regular rate and rhythm, no murmurs / rubs / gallops. No LE edema.  Abdomen: no tenderness. Bowel sounds positive.  Neurologic: cerebral palsy  Data Reviewed: I have independently reviewed following labs and imaging studies   CBC:  Recent Labs Lab 09/29/16 0029 09/29/16 0630 09/30/16 0355 10/01/16 0312 10/02/16 0306  WBC 27.4* 28.6* 16.1* 9.8 7.3  NEUTROABS 21.3*  --   --   --   --   HGB 17.2* 16.5* 15.6* 14.2 12.7  HCT 50.0* 49.0* 44.8 41.0 37.8  MCV 91.7 95.0 89.8 88.6 91.5  PLT 365 239 200 183 162   Basic Metabolic Panel:  Recent Labs Lab 09/29/16 0029 09/29/16 0630 09/30/16 0355 10/01/16 0312 10/02/16 0306  NA 143 143 136 143 141  K 2.8* 3.6 3.0* 3.0* 2.9*  CL 102 107 98* 101 100*  CO2 25 29 26  33* 33*  GLUCOSE 222* 160* 114* 117* 94  BUN 12 8 <5* 6 6  CREATININE 0.94 0.67 0.42* 0.34* 0.34*  CALCIUM 9.5 7.1* 8.2* 8.8* 8.2*  MG  --   --   --   --  1.8   Liver Function Tests:  Recent Labs Lab 09/29/16 0029 10/02/16 0306  AST 39 17  ALT 25 19  ALKPHOS 130* 54  BILITOT 0.6 0.6  PROT 7.5 5.2*  ALBUMIN 4.3 3.0*   Coagulation Profile:  Recent Labs Lab 09/29/16 0029  INR 1.39   CBG:  Recent Labs Lab 09/28/16 2340 09/29/16 0750 09/29/16 0812 09/29/16 1227 09/29/16 1638  GLUCAP 217* 155* 148* 134* 112*   Urine analysis:    Component Value Date/Time   COLORURINE YELLOW 09/29/2016 0054   APPEARANCEUR CLOUDY (A) 09/29/2016 0054   APPEARANCEUR Hazy 03/18/2012 1233   LABSPEC 1.012 09/29/2016 0054   LABSPEC 1.006 03/18/2012 1233   PHURINE 5.0 09/29/2016 0054   GLUCOSEU 50 (A) 09/29/2016 0054   GLUCOSEU Negative 03/18/2012 1233   HGBUR SMALL (A) 09/29/2016 0054   BILIRUBINUR NEGATIVE 09/29/2016 0054   BILIRUBINUR Negative 03/18/2012 1233   KETONESUR NEGATIVE 09/29/2016 0054   PROTEINUR 100 (A) 09/29/2016 0054    UROBILINOGEN 0.2 11/19/2013 2230   NITRITE NEGATIVE 09/29/2016 0054   LEUKOCYTESUR LARGE (A) 09/29/2016 0054   LEUKOCYTESUR 3+ 03/18/2012 1233   Sepsis Labs: Invalid input(s): PROCALCITONIN, LACTICIDVEN  Recent Results (from the past 240 hour(s))  Culture, blood (Routine x 2)     Status: None (Preliminary result)   Collection Time: 09/29/16 12:27 AM  Result Value Ref Range Status   Specimen Description BLOOD LEFT HAND  Final   Special Requests   Final    BOTTLES DRAWN AEROBIC AND ANAEROBIC Blood Culture adequate volume   Culture   Final    NO GROWTH 3 DAYS Performed at [facility], [address] [place], [place] [address]    Report Status PENDING  Incomplete  Culture, blood (Routine x 2)     Status: None (Preliminary result)   Collection Time: 09/29/16 12:27 AM  Result Value Ref Range Status   Specimen Description BLOOD RIGHT ANTECUBITAL  Final   Special Requests   Final    BOTTLES DRAWN AEROBIC AND ANAEROBIC Blood Culture adequate volume   Culture   Final    NO GROWTH 3 DAYS Performed at [facility], [address] [place], [place] [address]    Report Status PENDING  Incomplete  Urine culture     Status: Abnormal   Collection Time: 09/29/16 12:53 AM  Result Value Ref Range Status   Specimen Description URINE, CATHETERIZED  Final   Special Requests NONE  Final   Culture (A)  Final    >=100,000 COLONIES/mL ESCHERICHIA COLI Confirmed Extended Spectrum Beta-Lactamase Producer (ESBL) Performed at [facility], [address], [place], [place] [address]    Report Status 10/01/2016 FINAL  Final   Organism ID, Bacteria ESCHERICHIA COLI (A)  Final      Susceptibility   Escherichia coli - MIC*    AMPICILLIN >=32 RESISTANT Resistant     CEFAZOLIN >=64 RESISTANT Resistant     CEFTRIAXONE >=64 RESISTANT Resistant     CIPROFLOXACIN >=4 RESISTANT Resistant     GENTAMICIN <=1 SENSITIVE Sensitive     IMIPENEM <=0.25 SENSITIVE Sensitive     NITROFURANTOIN <=16  SENSITIVE Sensitive     TRIMETH/SULFA <=20 SENSITIVE Sensitive     AMPICILLIN/SULBACTAM 8 SENSITIVE Sensitive     PIP/TAZO <=4 SENSITIVE Sensitive     Extended ESBL POSITIVE Resistant     * >=100,000 COLONIES/mL ESCHERICHIA COLI  MRSA PCR Screening     Status: None   Collection Time: 09/29/16  5:23 AM  Result Value Ref Range Status   MRSA by PCR NEGATIVE NEGATIVE Final    Comment:        The GeneXpert MRSA Assay (FDA approved for NASAL specimens only), is one component of a comprehensive MRSA colonization surveillance program. It is not intended to diagnose MRSA infection nor to guide or monitor treatment for MRSA infections.       Radiology Studies: No results found. Scheduled Meds: . chlorhexidine  15 mL Mouth Rinse BID  . [START ON 10/03/2016] clonazePAM  0.5 mg Oral QPC breakfast  . enoxaparin (LOVENOX) injection  40 mg Subcutaneous Q24H  . fentaNYL  25 mcg Transdermal Q72H  . lamoTRIgine  100 mg Oral BID  . mouth rinse  15 mL Mouth Rinse q12n4p   Continuous Infusions: . sodium chloride 75 mL/hr at 10/02/16 0826  . levETIRAcetam Stopped (10/02/16 0707)  . meropenem (MERREM) IV Stopped (10/02/16 1350)  . valproate sodium Stopped (10/02/16 1421)    Kennis Carina, MD [facility] Pager [phone]  If 7PM-7AM, please contact night-coverage [email or web address] Password [email or web address] 10/02/2016, 5:05 PM

## 2016-10-02 NOTE — Progress Notes (Signed)
Daily Progress Note   Patient Name: Kristi Perry       Date: 10/02/2016 DOB: 02-09-1966  Age: 51 y.o. MRN#: [id number] Attending Physician: Onnie Boer, MD Primary Care Physician: Angela Cox, MD Admit Date: 09/28/2016  Reason for Consultation/Follow-up: Establishing goals of care  Subjective: Eyes open but does not otherwise interact with examiner Lying in bed in no distress See below:   Length of Stay: 3  Current Medications: Scheduled Meds:  . chlorhexidine  15 mL Mouth Rinse BID  . enoxaparin (LOVENOX) injection  40 mg Subcutaneous Q24H  . fentaNYL  25 mcg Transdermal Q72H  . mouth rinse  15 mL Mouth Rinse q12n4p  . potassium chloride SA  40 mEq Oral Q4H    Continuous Infusions: . sodium chloride 75 mL/hr at 10/02/16 0826  . levETIRAcetam Stopped (10/02/16 0707)  . meropenem (MERREM) IV Stopped (10/02/16 4098)  . potassium chloride 10 mEq (10/02/16 0826)  . valproate sodium 500 mg (10/02/16 0817)    PRN Meds: acetaminophen, fentaNYL (SUBLIMAZE) injection, labetalol, LORazepam, RESOURCE THICKENUP CLEAR  Physical Exam         Eyes open but does not consistently track or interact S1 S2 Clear Abdomen soft Trace edema  Vital Signs: BP 133/66   Pulse 76   Temp 100 F (37.8 C) (Oral)   Resp (!) 23   Ht 4\' 9"  (1.448 m)   Wt 67.9 kg (149 lb 11.1 oz)   LMP  (LMP Unknown) Comment: pt unresponsive   SpO2 97%   BMI 32.39 kg/m  SpO2: SpO2: 97 % O2 Device: O2 Device: Nasal Cannula O2 Flow Rate: O2 Flow Rate (L/min): 6 L/min  Intake/output summary:   Intake/Output Summary (Last 24 hours) at 10/02/16 0853 Last data filed at 10/02/16 0542  Gross per 24 hour  Intake              850 ml  Output             1725 ml  Net             -875 ml   LBM:  Last BM Date:  (unknown) Baseline Weight: Weight: 81.6 kg (180 lb) Most recent weight: Weight: 67.9 kg (149 lb 11.1 oz)       Palliative Assessment/Data:    Flowsheet Rows  Most Recent Value  Intake Tab  Referral Department  Hospitalist  Unit at Time of Referral  Intermediate Care Unit  Palliative Care Primary Diagnosis  Other (Comment) [seizure, sepsis, pna, CP]  Palliative Care Type  New Palliative care  Reason for referral  Clarify Goals of Care  Date first seen by Palliative Care  09/29/16  Clinical Assessment  Palliative Performance Scale Score  10%  Pain Max last 24 hours  2  Pain Min Last 24 hours  1  Psychosocial & Spiritual Assessment  Palliative Care Outcomes  Patient/Family meeting held?  No  Palliative Care Outcomes  Other (Comment) [call placed, unable to reach mother ]      Patient Active Problem List   Diagnosis Date Noted  . Pressure injury of skin 10/02/2016  . Post-ictal coma (HCC) 09/29/2016  . Sepsis (HCC) 11/20/2013  . Unspecified hypothyroidism 11/20/2013  . Seizures (HCC) 11/20/2013  . Aspiration pneumonia (HCC) 11/20/2013  . UTI (urinary tract infection) 11/20/2013  . Acute respiratory failure with hypoxia (HCC) 11/19/2013  . Infection due to ESBL-producing Escherichia coli 04/07/2012  . Thyroid nodule 04/07/2012  . Nephrolithiasis 04/05/2012  . Acute lower UTI 04/03/2012  . HCAP (healthcare-associated pneumonia) 02/27/2012  . Cerebral palsy (HCC) 02/27/2012    Palliative Care Assessment & Plan   Patient Profile:  51 year old female with history of cerebral palsy, seizures, aspiration pneumonia who came in after prolonged diminished lung seizure at the SNF Assessment: Post ictal coma and seizures HCAP causing acute respiratory failure with hypoxia Underlying cerebral palsy, bed bound state, functional quadriplegia, and seizure disorder Recommendations/Plan:  Much less interactive today. Question if post-ictal following another  seizure.   Follow hospital course, monitor Po intake, monitor physical activity.    Pain: She appears comfortable this AM.  While her home dose fentanyl patch was restarted yesterday, her mental status change (suspect post-ictal state) was prior to restarting patch and I doubt it is reason for mental status change.      Recommend SNF [facility] with palliative to follow on d/c.  Have attempted to call mother multiple times yesterday (as did hospitalist) and again this morning.  Await call back from mother to discuss.  She is difficult to reach and reportedly has her own medical problems and cannot come to the hospital to visit.   Code Status:    Code Status Orders        Start     Ordered   09/29/16 0258  Do not attempt resuscitation (DNR)  Continuous    Question Answer Comment  In the event of cardiac or respiratory ARREST Do not call a "code blue"   In the event of cardiac or respiratory ARREST Do not perform Intubation, CPR, defibrillation or ACLS   In the event of cardiac or respiratory ARREST Use medication by any route, position, wound care, and other measures to relive pain and suffering. May use oxygen, suction and manual treatment of airway obstruction as needed for comfort.      09/29/16 0305    Code Status History    Date Active Date Inactive Code Status Order ID Comments User Context   11/20/2013  1:13 AM 11/23/2013  4:06 PM DNR [id number]  Haydee Monicaavid, Rachal A, MD ED   04/03/2012  9:06 AM 04/07/2012  4:13 PM Full Code [id number]  Orlando Pennerraegbunam, Ifeoma Kate, RN Inpatient   02/28/2012  2:00 AM 03/02/2012  7:25 PM DNR [id number]  Rolan Lipaallahan, Thomas Michael, NP Inpatient  02/27/2012  6:14 PM 02/28/2012  2:00 AM Full Code [id number]  Zerita BoersWhitaker, Lonnie R, RN Inpatient    Advance Directive Documentation     Most Recent Value  Type of Advance Directive  Out of facility DNR (pink MOST or yellow form)  Pre-existing out of facility DNR order (yellow form or pink MOST  form)  Yellow form placed in chart (order not valid for inpatient use)  "MOST" Form in Place?  -       Prognosis:   guarded   Discharge Planning:  Recommend patient go back to her SNF with palliative on d/c.   Care plan was discussed with patient, bedside RN.   Thank you for allowing the Palliative Medicine Team to assist in the care of this patient.   Time In: 0820 Time Out: 0905 Total Time 25 Prolonged Time Billed  no       Greater than 50%  of this time was spent counseling and coordinating care related to the above assessment and plan.  Romie MinusGene Ausencio Vaden, MD  Please contact Palliative Medicine Team phone at [phone] for questions and concerns.   Romie MinusGene Baylin Gamblin, MD [facility] Palliative Medicine Team [phone]

## 2016-10-02 NOTE — Consult Note (Signed)
NEURO HOSPITALIST CONSULT NOTE   Requestig physician: Dr. Syliva Overman   Reason for Consult: Seizures   History obtained from:  Chart, Consulting Physician  HPI:                                                                                                                                         Kristi Perry is a 51 y.o. female with medical history significant of seizures, CP with quadraplegia , chronic pain syndrome admitted for prolonged seizure in the setting of sepsis from HCAP. She was admitted on 7/29 after having 20 min seizures in her nursing home. She was post ictal on arrival and case was discussed over the phone with Dr. Otelia Limes from Neurology during admission. The patient was comatose for initial 24 hrs and then started improving close to baseline.  However in the afternoon, Dr Lafe Garin felt the patient had another seizure as she was more lethargic. No seizure activity was witnessed. Neurology consulted for management of seizures  For this patient with refractory epilepsy. She was on 500mg  Keppra,   I recommend loading with 1g of Keppra and increasing her maintenance to 1g BID.   Past Medical History:  Diagnosis Date  . Acute respiratory failure (HCC)   . Allergic rhinitis   . Anxiety   . Cerebral palsy (HCC)   . Chronic airway obstruction (HCC)   . Chronic pain syndrome   . Depression   . DVT (deep vein thrombosis) in pregnancy (HCC)   . Dysphagia   . Dysphasia   . Epilepsy (HCC)   . Hiatal hernia   . Hyperlipidemia   . Hypertension   . Hypothyroidism   . Incontinent of feces 09-25-11   hx.  . Incontinent of urine 09-25-11   hx.  . Migraine   . Mild intellectual disabilities   . Morbid obesity (HCC) 09-25-11   requires Hoyer lift. pt. doesn't stand or ambulate.  . Muscle weakness   . Nephrolithiasis 04/05/2012  . Neurogenic bladder   . Pneumonia    hx of asp pneumonia 1/11-1/19/12  . Sacral decubitus ulcer    hx of  . Seizures (HCC)   .  Sepsis(995.91)   . Sepsis(995.91)    hx of   . Unspecified psychosis   . Vitamin D deficiency     Past Surgical History:  Procedure Laterality Date  . CYSTOSCOPY WITH URETEROSCOPY  11/07/2011   Procedure: CYSTOSCOPY WITH URETEROSCOPY;  Surgeon: Marcine Matar, MD;  Location: [facility];  Service: Urology;  Laterality: Right;  Removal of right double J stent, Insertion right double J stent  . NEPHROLITHOTOMY  10/07/2011   Procedure: NEPHROLITHOTOMY PERCUTANEOUS;  Surgeon: Marcine Matar, MD;  Location: [facility];  Service: Urology;  Laterality: Right;  kidney   . NEPHROLITHOTOMY  12/06/2011  Procedure: NEPHROLITHOTOMY PERCUTANEOUS;  Surgeon: Marcine MatarStephen Dahlstedt, MD;  Location: [facility];  Service: Urology;  Laterality: Right;   REPEAT PCNL   . STONE EXTRACTION WITH BASKET  11/07/2011   Procedure: STONE EXTRACTION WITH BASKET;  Surgeon: Marcine MatarStephen Dahlstedt, MD;  Location: [facility];  Service: Urology;  Laterality: Right;    History reviewed. No pertinent family history.    Social History:  reports that she has never smoked. She has never used smokeless tobacco. She reports that she does not drink alcohol or use drugs.  Allergies  Allergen Reactions  . Sulfa Antibiotics Hives  . Tape Rash    Paper tape    MEDICATIONS:                                                                                                                     Reviewed all medications   ROS:                                                                                                                                       Unable to perform due to mental status   EXAMINATON   Neurological Examination Mental Status: Awake, tracks, answers few questions. Not oriented to place Cranial Nerves: Pupils reactive to light, equal, Disconjugate gaze from esotropia left eye. Tracks to voice. face symmetric. Motor: Increased tone, contracted in all 4 extremities.  Sensory: Pinprick and light touch intact throughout,  bilaterally Deep Tendon Reflexes: 2+  Plantars: down going  Unable to test gait and coordination    Lab Results: Basic Metabolic Panel:  Recent Labs Lab 09/29/16 0029 09/29/16 0630 09/30/16 0355 10/01/16 0312 10/02/16 0306  NA 143 143 136 143 141  K 2.8* 3.6 3.0* 3.0* 2.9*  CL 102 107 98* 101 100*  CO2 25 29 26  33* 33*  GLUCOSE 222* 160* 114* 117* 94  BUN 12 8 <5* 6 6  CREATININE 0.94 0.67 0.42* 0.34* 0.34*  CALCIUM 9.5 7.1* 8.2* 8.8* 8.2*    Liver Function Tests:  Recent Labs Lab 09/29/16 0029 10/02/16 0306  AST 39 17  ALT 25 19  ALKPHOS 130* 54  BILITOT 0.6 0.6  PROT 7.5 5.2*  ALBUMIN 4.3 3.0*    Imaging: CT head on 7/29 - no acute findings. Mild atrophy.   Assessment/Plan:  SEIZURES SEPSIS  Kristi Perry is a 51 y.o. female with medical history significant of seizures, CP with quadraplegia ,  chronic pain syndrome admitted for prolonged seizure in the setting of sepsis from HCAP.  I examined the patient today and while she is more lethargic than usual, she appears to be getting close to her baseline.   Patients home medications were Keppra 250 mg BID  Lamotrigine 100 mg BID Divalproax 750 mg QHS Clonazepam 1mg  BID ( pt taking 0.5 mg mouth daily)   Currently on  Keppra 1 g BID Depakone  500 TID ( subtherapeutic dose at VPA level of 18)   Recommendations Place NG tube if patient not feeding and restart lamotrigine. Lamotrigine is less sedating and has a mood stabilizing effect and important that she resumes her home meds. I would also restart 0.5 mg of clonazepam as she was taking at home so as to avoid withdrawal seizures.  EEG performed yesterday showed epileptiform discharges from the left temporal region however no electrographic seizures. It may be worthwhile to obtain outside records to see if these findings are similar to her prior EEGs. Patient continues to have clinical seizures, then we have room to go up on Keppra and valproic acid.

## 2016-10-02 NOTE — Progress Notes (Signed)
Pharmacy Antibiotic Note  Kristi Perry is a 51 y.o. female admitted on 09/28/2016 with LLL PNA with yx MRSA and ESBL.   Pharmacy has been consulted for meropenem dosing.   Today, 10/02/2016  Day #4 meropenem WBC 7.3, much improved  Lactate 5.55 > 1.0 Tmax 100.1  SCr low, CrCl 66 which may be overestimate with low muscle mass   Plan:  Meropenem 1 gm IV q8h remains appropriate for renal function.   Please note that carbapenems can lower the seizure threshold. Recommend changing to either Zosyn or Unasyn if concerned about aspiration pneumonia  Note that Zosyn or Unasyn will not cover ESBL in urine (despite reported sensitivities). If able to obtain enteral access with NG tube, recommend giving fosfomycin 3g x 1 dose to complete treatment.  Height: 4\' 9"  (144.8 cm) Weight: 149 lb 11.1 oz (67.9 kg) IBW/kg (Calculated) : 38.6  Temp (24hrs), Avg:99.9 F (37.7 C), Min:98.2 F (36.8 C), Max:102 F (38.9 C)   Recent Labs Lab 09/29/16 0029 09/29/16 0037 09/29/16 0630 09/30/16 0355 10/01/16 0312 10/02/16 0306  WBC 27.4*  --  28.6* 16.1* 9.8 7.3  CREATININE 0.94  --  0.67 0.42* 0.34* 0.34*  LATICACIDVEN  --  5.55* 1.0  --   --   --     Estimated Creatinine Clearance: 66.8 mL/min (A) (by C-G formula based on SCr of 0.34 mg/dL (L)).    Allergies  Allergen Reactions  . Sulfa Antibiotics Hives  . Tape Rash    Paper tape    Antimicrobials this admission: Vanc 7/29>> 7/30 Zosyn 7/29 x 1 dose Meropenem 7/29>>  Dose adjustments this admission:   Microbiology results: 7/29 BCx2: ngtd 7/28 UCx: >100k E.coli ESBL 7/29 Urine strep: neg 7/29 MRSA PCR: negative  Thank you for allowing pharmacy to be a part of this patient's care.  Loralee PacasErin Balin Vandegrift, PharmD, BCPS Pager: [phone] 10/02/2016 12:01 PM

## 2016-10-02 NOTE — Progress Notes (Signed)
SLP Cancellation Note  Patient Details Name: Kristi Perry MRN: [id number] DOB: 06/26/1965   Cancelled treatment:       Reason Eval/Treat Not Completed: Fatigue/lethargy limiting ability to participate  Asked RN to page me if pt awakes adequately for po and/or evaluation.  RN reports pt opens eyes briefly to stimulation but does not sustain.   Donavan Burnetamara Alsace Dowd, MS [facility] SLP [phone]  Chales AbrahamsKimball, Milisa Kimbell Ann 10/02/2016, 9:24 AM

## 2016-10-03 ENCOUNTER — Inpatient Hospital Stay (HOSPITAL_COMMUNITY): Payer: Medicare Other

## 2016-10-03 NOTE — Progress Notes (Signed)
Daily Progress Note   Patient Name: Kristi Perry       Date: 10/03/2016 DOB: 12/08/1965  Age: 51 y.o. MRN#: [id number] Attending Physician: Onnie BoerEmokpae, Ejiroghene E, MD Primary Care Physician: Angela Coxasanayaka, [name], MD Admit Date: 09/28/2016  Reason for Consultation/Follow-up: Establishing goals of care  Subjective: Eyes open but does not otherwise interact with examiner Lying in bed in no distress See below:   Length of Stay: 4 Physical Exam               Eyes open but does not consistently track or interact S1 S2 Clear Abdomen soft Trace edema  Vital Signs: BP (!) 154/69 (BP Location: Left Arm)   Pulse 91   Temp 99.6 F (37.6 C) (Oral)   Resp 20   Ht 4\' 9"  (1.448 m)   Wt 67.9 kg (149 lb 11.1 oz)   LMP  (LMP Unknown) Comment: pt unresponsive   SpO2 100%   BMI 32.39 kg/m  SpO2: SpO2: 100 % O2 Device: O2 Device: Nasal Cannula O2 Flow Rate: O2 Flow Rate (L/min): 3 L/min  Intake/output summary:   Intake/Output Summary (Last 24 hours) at 10/03/16 1523 Last data filed at 10/03/16 1442  Gross per 24 hour  Intake             1830 ml  Output             1300 ml  Net              530 ml   LBM: Last BM Date: 10/02/16 Baseline Weight: Weight: 81.6 kg (180 lb) Most recent weight: Weight: 67.9 kg (149 lb 11.1 oz)       Palliative Assessment/Data:    Flowsheet Rows     Most Recent Value  Intake Tab  Referral Department  Hospitalist  Unit at Time of Referral  Intermediate Care Unit  Palliative Care Primary Diagnosis  Other (Comment) [seizure, sepsis, pna, CP]  Palliative Care Type  New Palliative care  Reason for referral  Clarify Goals of Care  Date first seen by Palliative Care  09/29/16  Clinical Assessment  Palliative Performance Scale Score  10%  Pain Max last 24 hours   2  Pain Min Last 24 hours  1  Psychosocial & Spiritual Assessment  Palliative Care Outcomes  Patient/Family meeting held?  No  Palliative Care Outcomes  Other (Comment) [call placed, unable to reach mother ]      Patient Active Problem List   Diagnosis Date Noted  . Pressure injury of skin 10/02/2016  . Post-ictal coma (HCC) 09/29/2016  . Sepsis (HCC) 11/20/2013  . Unspecified hypothyroidism 11/20/2013  . Seizures (HCC) 11/20/2013  . Aspiration pneumonia (HCC) 11/20/2013  . UTI (urinary tract infection) 11/20/2013  . Acute respiratory failure with hypoxia (HCC) 11/19/2013  . Infection due to ESBL-producing Escherichia coli 04/07/2012  . Thyroid nodule 04/07/2012  . Nephrolithiasis 04/05/2012  . Acute lower UTI 04/03/2012  . HCAP (healthcare-associated pneumonia) 02/27/2012  . Cerebral palsy (HCC) 02/27/2012    Palliative Care Assessment & Plan   Patient Profile:  51 year old female with history of cerebral palsy, seizures, aspiration pneumonia who came in after prolonged diminished lung seizure at the  SNF Assessment: Post ictal coma and seizures HCAP causing acute respiratory failure with hypoxia Underlying cerebral palsy, bed bound state, functional quadriplegia, and seizure disorder Recommendations/Plan:  Much less interactive today. Question if post-ictal following another seizure.   Follow hospital course, monitor Po intake, monitor physical activity.   Pain: She appears comfortable this AM.        Recommend SNF ([facility] with palliative to follow on d/c assuming her condition recovers to a point where this is feasible.  Hopeful that neurology recs will positively impact her mental status, but I am concerned by her decline from first meeting her 2 days ago.  Have attempted to call mother multiple times (as did hospitalist) over past several days.  Await call back from mother to discuss.  She is difficult to reach and reportedly has  her own medical problems and cannot come to the hospital to visit.   Code Status:    Code Status Orders        Start     Ordered   09/29/16 0258  Do not attempt resuscitation (DNR)  Continuous    Question Answer Comment  In the event of cardiac or respiratory ARREST Do not call a "code blue"   In the event of cardiac or respiratory ARREST Do not perform Intubation, CPR, defibrillation or ACLS   In the event of cardiac or respiratory ARREST Use medication by any route, position, wound care, and other measures to relive pain and suffering. May use oxygen, suction and manual treatment of airway obstruction as needed for comfort.      09/29/16 0305    Code Status History    Date Active Date Inactive Code Status Order ID Comments User Context   11/20/2013  1:13 AM 11/23/2013  4:06 PM DNR [id number]  Haydee Monicaavid, Rachal A, MD ED   04/03/2012  9:06 AM 04/07/2012  4:13 PM Full Code [id number]  Orlando Pennerraegbunam, Ifeoma Kate, RN Inpatient   02/28/2012  2:00 AM 03/02/2012  7:25 PM DNR [id number]  Rolan Lipaallahan, Thomas Michael, NP Inpatient   02/27/2012  6:14 PM 02/28/2012  2:00 AM Full Code [id number]  Zerita BoersWhitaker, Lonnie R, RN Inpatient    Advance Directive Documentation     Most Recent Value  Type of Advance Directive  Out of facility DNR (pink MOST or yellow form)  Pre-existing out of facility DNR order (yellow form or pink MOST form)  Yellow form placed in chart (order not valid for inpatient use)  "MOST" Form in Place?  -       Prognosis:   guarded   Discharge Planning:  Recommend patient go back to her SNF with palliative on d/c.   Care plan was discussed with bedside RN.   I still have not been able to reach mother to discuss.  Thank you for allowing the Palliative Medicine Team to assist in the care of this patient.   Time In: 0800 Time Out: 0820 Total Time 20 Prolonged Time Billed  no       Greater than 50%  of this time was spent counseling and coordinating care related to the above assessment and  plan.  Romie MinusGene Detria Cummings, MD  Please contact Palliative Medicine Team phone at [phone] for questions and concerns.   Romie MinusGene Chantae Soo, MD [facility] Palliative Medicine Team [phone]

## 2016-10-03 NOTE — Progress Notes (Signed)
[facility]. Assessed patient no changes from Previous RN Dondra SpryGail assessment.

## 2016-10-03 NOTE — Progress Notes (Signed)
  Speech Language Pathology Treatment: Dysphagia  Patient Details Name: Kristi HawthorneWendy R Lema MRN: [id number] DOB: 10/11/1965 Today's Date: 10/03/2016 Time: [id number] SLP Time Calculation (min) (ACUTE ONLY): 10 min  Assessment / Plan / Recommendation Clinical Impression  Pt seen to determine readiness for po intake - pt now has NG tube in place for medications- per RN - not for nutrition.  Pt with eyes open upon SLP entrance to room and attempted to follow directions *cough and phonate.  She is observed to have gurgly breathing that did not clear with cued/reflexive cough.  ? Due to aspiration of secretions that are exacerbated by her NG tube - possibly preventing epiglottic deflection.      Pt appears with facial and right shoulder erythema - RN made aware of finding and that it was NOT present during initial evaluation with this SLP Monday.   1/2 tsp of juice presented to pt with her being alert- oral holding with baseline "matication" in absence of bolus - absent swallow, 1/2 tsp likely absorbed by oral tissues.  SLP also questions if "mastication" type movement is consistent with dyskinesia.   At this time, SLP will sign off as pt not making progress and continues to be lethargic.  Of note, pt was on puree/thin diet prior to admission.  Note palliative following patient.  SLP to sign off, please re-consult if pt mentation improves to allow reevaluation of swallow.      HPI HPI: 51 yo adm from [facility] with seizures.  PMH + for CP, asp pna, functional quadriparesis - bedbound.  Swallow eval ordered.  Per review of soft chart, pt on puree/thin at SNF.  Pt has not had prior imaging swallow studies.  She was placed on a dys1/thin diet clinically = with intially good tolerance.  Pt became lethargic and now has NG tube for medications as she can not have po intake.        SLP Plan  Discharge SLP treatment due to (comment) (lack of improvement, pt has remained lethargic over several days,  palliative following pt)       Recommendations  Diet recommendations: NPO Medication Administration: Via alternative means                Oral Care Recommendations: Oral care QID Follow up Recommendations: Skilled Nursing facility (dependent on goals of care) SLP Visit Diagnosis: Dysphagia, oropharyngeal phase (R13.12) Plan: Discharge SLP treatment due to (comment) (lack of improvement, pt has remained lethargic over several days, palliative following pt)       GO                Donavan Burnetamara Reighan Hipolito, MS [facility] SLP [phone]

## 2016-10-03 NOTE — Progress Notes (Signed)
PROGRESS NOTE  Kristi LabradorWendy R Wollin XBJ:[id number] DOB: 07/14/1965 DOA: 09/28/2016 PCP: Angela Coxasanayaka, [name], MD   LOS: 4 days   Brief Narrative / Interim history: 51 year old female with history of cerebral palsy, seizures, aspiration pneumonia who came in after prolonged seizure activity at the SNF.  She was admitted to the hospital in guarded prognosis being comatose and unresponsive, placed in stepdown and started on IV antiepileptics.  Patient was initially comatose, and it was felt like she will not survive the first 24 hours.  Palliative care was consulted at that time.  With antiepileptics IV as well as antibiotics, patient's mental status slowly improved and she is approaching baseline.  Assessment & Plan: Principal Problem:   Post-ictal coma (HCC) Active Problems:   HCAP (healthcare-associated pneumonia)   Acute lower UTI   Acute respiratory failure with hypoxia (HCC)   Sepsis (HCC)   Seizures (HCC)   Pressure injury of skin  Post ictal coma and seizures- no significant change in mental status. No obvious seizures. Patient not responding verbally but eyes open.  - Neurology recommendations appreciated- NG tube ordered, home amitriptyline and clonazepam re-ordered - Continue Keppra and Depacon and IV forms for now - Pt DNR, transfer off ICU. - Serum Vaproic acid level is subtherapeutic, neurology following, ?need increased dose?  Sepsis due to HCAP causing acute respiratory failure with hypoxia. Fever 100.9 overnight. Repeat chest x-ray today shows improving opacities. WBC stable. - At risk for recurrent aspiration, with comorbidities and bedbound status, and appears to have some dysphagia but cannot be fully evaluated by speech therapy due to mental status. - Blood cultures still negative - Agree with pharmacy recommendations to DC IV meropenem 7/29 >>8/1. As carbapenems lower seizure threshold - Continue IV antibiotic Zosyn per pharmacy 8/1 >> - cover possible aspiration pneumonia  and HCAP. - Repeat blood cultures today 8/2. - MRSA negative  UTI - Will d/c meropenem- lowers seizure threshold, now on Unasyn -  Urine cultures- ESBL Ecoli. Completed #3 days of meropenem. Fosfomycin X1 for complete dose - Pharmacist notes- unasyn and Zosyn not cover ESBL in urine despite culture sensitivities recommended fosfomycin 1 dose to complete treatment .  Underlying cerebral palsy, bed bound state, functional quadriplegia, and seizure disorder -guarded prognosis overall -Palliative care following appreciate input, home fentanyl patch restarted today, this was after change in mental status - long term Resident of nursing home of nursing home   Hypertension- stable  -For now use IV labetalol as needed  DVT prophylaxis: Lovenox Code Status: Full code Family Communication: attempted to call mother 7/31 Disposition Plan: home when ready   Consultants:   Palliative care  Neurology   Procedures:   None  Antimicrobials:  Vancomycin 7/29 >> 7/30   Meropenem 7/29 >> 8/1  Zosyn 8/1 >>  Subjective: Awake, with Eyes open, not responding verbally.   Objective: Vitals:   10/02/16 1626 10/02/16 2016 10/03/16 0502 10/03/16 1441  BP: (!) 141/72 125/68 (!) 138/51 (!) 154/69  Pulse: (!) 102 64 71 91  Resp: 16 20 19 20   Temp: 99.1 F (37.3 C) (!) 100.9 F (38.3 C) 98.3 F (36.8 C) 99.6 F (37.6 C)  TempSrc: Oral Oral Axillary Oral  SpO2: 97% 96% 95% 100%  Weight:      Height:        Intake/Output Summary (Last 24 hours) at 10/03/16 1700 Last data filed at 10/03/16 1442  Gross per 24 hour  Intake  1860 ml  Output             1300 ml  Net              560 ml   Filed Weights   09/29/16 0120 09/29/16 1200  Weight: 81.6 kg (180 lb) 67.9 kg (149 lb 11.1 oz)    Examination:  Vitals:   10/02/16 1626 10/02/16 2016 10/03/16 0502 10/03/16 1441  BP: (!) 141/72 125/68 (!) 138/51 (!) 154/69  Pulse: (!) 102 64 71 91  Resp: 16 20 19 20   Temp: 99.1 F  (37.3 C) (!) 100.9 F (38.3 C) 98.3 F (36.8 C) 99.6 F (37.6 C)  TempSrc: Oral Oral Axillary Oral  SpO2: 97% 96% 95% 100%  Weight:      Height:       Constitutional: NAD Eyes: PERRL, lids and conjunctivae normal, likely injected Right eye appears to be deviating to the left. ENMT: Mucous membranes- Not examined due to mental status Respiratory: Congested breath sounds. Equal. Normal respiratory effort.  Cardiovascular: Regular rate and rhythm, no murmurs / rubs / gallops. No LE edema.  Abdomen: no tenderness. Bowel sounds positive.  Neurologic: cerebral palsy  Data Reviewed: I have independently reviewed following labs and imaging studies   CBC:  Recent Labs Lab 09/29/16 0029 09/29/16 0630 09/30/16 0355 10/01/16 0312 10/02/16 0306  WBC 27.4* 28.6* 16.1* 9.8 7.3  NEUTROABS 21.3*  --   --   --   --   HGB 17.2* 16.5* 15.6* 14.2 12.7  HCT 50.0* 49.0* 44.8 41.0 37.8  MCV 91.7 95.0 89.8 88.6 91.5  PLT 365 239 200 183 162   Basic Metabolic Panel:  Recent Labs Lab 09/29/16 0630 09/30/16 0355 10/01/16 0312 10/02/16 0306 10/02/16 1738  NA 143 136 143 141 143  K 3.6 3.0* 3.0* 2.9* 3.5  CL 107 98* 101 100* 99*  CO2 29 26 33* 33* 38*  GLUCOSE 160* 114* 117* 94 84  BUN 8 <5* 6 6 6   CREATININE 0.67 0.42* 0.34* 0.34* <0.30*  CALCIUM 7.1* 8.2* 8.8* 8.2* 8.6*  MG  --   --   --  1.8  --    Liver Function Tests:  Recent Labs Lab 09/29/16 0029 10/02/16 0306  AST 39 17  ALT 25 19  ALKPHOS 130* 54  BILITOT 0.6 0.6  PROT 7.5 5.2*  ALBUMIN 4.3 3.0*   Coagulation Profile:  Recent Labs Lab 09/29/16 0029  INR 1.39   CBG:  Recent Labs Lab 09/28/16 2340 09/29/16 0750 09/29/16 0812 09/29/16 1227 09/29/16 1638  GLUCAP 217* 155* 148* 134* 112*   Urine analysis:    Component Value Date/Time   COLORURINE YELLOW 09/29/2016 0054   APPEARANCEUR CLOUDY (A) 09/29/2016 0054   APPEARANCEUR Hazy 03/18/2012 1233   LABSPEC 1.012 09/29/2016 0054   LABSPEC 1.006  03/18/2012 1233   PHURINE 5.0 09/29/2016 0054   GLUCOSEU 50 (A) 09/29/2016 0054   GLUCOSEU Negative 03/18/2012 1233   HGBUR SMALL (A) 09/29/2016 0054   BILIRUBINUR NEGATIVE 09/29/2016 0054   BILIRUBINUR Negative 03/18/2012 1233   KETONESUR NEGATIVE 09/29/2016 0054   PROTEINUR 100 (A) 09/29/2016 0054   UROBILINOGEN 0.2 11/19/2013 2230   NITRITE NEGATIVE 09/29/2016 0054   LEUKOCYTESUR LARGE (A) 09/29/2016 0054   LEUKOCYTESUR 3+ 03/18/2012 1233   Sepsis Labs: Invalid input(s): PROCALCITONIN, LACTICIDVEN  Recent Results (from the past 240 hour(s))  Culture, blood (Routine x 2)     Status: None (Preliminary result)   Collection Time: 09/29/16 12:27  AM  Result Value Ref Range Status   Specimen Description BLOOD LEFT HAND  Final   Special Requests   Final    BOTTLES DRAWN AEROBIC AND ANAEROBIC Blood Culture adequate volume   Culture   Final    NO GROWTH 4 DAYS Performed at [facility], [address], [place], [place] [address]    Report Status PENDING  Incomplete  Culture, blood (Routine x 2)     Status: None (Preliminary result)   Collection Time: 09/29/16 12:27 AM  Result Value Ref Range Status   Specimen Description BLOOD RIGHT ANTECUBITAL  Final   Special Requests   Final    BOTTLES DRAWN AEROBIC AND ANAEROBIC Blood Culture adequate volume   Culture   Final    NO GROWTH 4 DAYS Performed at [facility], [address] [place], [place] [address]    Report Status PENDING  Incomplete  Urine culture     Status: Abnormal   Collection Time: 09/29/16 12:53 AM  Result Value Ref Range Status   Specimen Description URINE, CATHETERIZED  Final   Special Requests NONE  Final   Culture (A)  Final    >=100,000 COLONIES/mL ESCHERICHIA COLI Confirmed Extended Spectrum Beta-Lactamase Producer (ESBL) Performed at [facility], [address] [place], [place] [address]    Report Status 10/01/2016 FINAL  Final   Organism ID, Bacteria ESCHERICHIA COLI (A)  Final       Susceptibility   Escherichia coli - MIC*    AMPICILLIN >=32 RESISTANT Resistant     CEFAZOLIN >=64 RESISTANT Resistant     CEFTRIAXONE >=64 RESISTANT Resistant     CIPROFLOXACIN >=4 RESISTANT Resistant     GENTAMICIN <=1 SENSITIVE Sensitive     IMIPENEM <=0.25 SENSITIVE Sensitive     NITROFURANTOIN <=16 SENSITIVE Sensitive     TRIMETH/SULFA <=20 SENSITIVE Sensitive     AMPICILLIN/SULBACTAM 8 SENSITIVE Sensitive     PIP/TAZO <=4 SENSITIVE Sensitive     Extended ESBL POSITIVE Resistant     * >=100,000 COLONIES/mL ESCHERICHIA COLI  MRSA PCR Screening     Status: None   Collection Time: 09/29/16  5:23 AM  Result Value Ref Range Status   MRSA by PCR NEGATIVE NEGATIVE Final    Comment:        The GeneXpert MRSA Assay (FDA approved for NASAL specimens only), is one component of a comprehensive MRSA colonization surveillance program. It is not intended to diagnose MRSA infection nor to guide or monitor treatment for MRSA infections.       Radiology Studies: Dg Chest Port 1 View  Result Date: 10/03/2016 CLINICAL DATA:  Altered mental status, recurrent fevers. EXAM: PORTABLE CHEST 1 VIEW COMPARISON:  Chest x-ray dated September 28, 2016. FINDINGS: Interval placement of an enteric tube with the tip and distal side port in the gastric body. The cardiomediastinal silhouette is normal. Patchy densities at the left lung base behind the heart appear slightly improved. No pleural effusion or pneumothorax. No acute osseous abnormality. IMPRESSION: Improved patchy density in the left lung behind the heart, likely related to resolving pneumonia and/or aspiration. No new consolidation. Electronically Signed   By: Obie Dredge M.D.   On: 10/03/2016 08:30   Dg Abd Portable 1v  Result Date: 10/02/2016 CLINICAL DATA:  Nasogastric tube placement. Cerebral palsy. Hiatal hernia. EXAM: PORTABLE ABDOMEN - 1 VIEW COMPARISON:  09/27/2015. Portable chest dated 09/28/2016. Abdomen pelvis CT dated 08/26/2012.  FINDINGS: Normal bowel gas pattern. Nasogastric tube tip  in the proximal to mid stomach and side hole in the proximal stomach. Probable suprapubic bladder catheter. Mild to moderate dextroconvex thoracolumbar scoliosis. Mild T12 and T9 vertebral compression deformities with no visible acute fracture lines, not present on 08/26/2012 and unchanged compared to 09/28/2016. There is also a mild L4 vertebral compression deformity without significant change since 08/26/2012. Diffuse osteopenia. IMPRESSION: 1. No acute abnormality. 2. Mild, old L4 vertebral compression deformity and probable old mild T9 and T12 vertebral compression deformities, new since 08/26/2012. Electronically Signed   By: Beckie Salts M.D.   On: 10/02/2016 19:46   Scheduled Meds: . chlorhexidine  15 mL Mouth Rinse BID  . clonazePAM  0.5 mg Oral QPC breakfast  . enoxaparin (LOVENOX) injection  40 mg Subcutaneous Q24H  . fentaNYL  25 mcg Transdermal Q72H  . lamoTRIgine  100 mg Oral BID  . mouth rinse  15 mL Mouth Rinse q12n4p   Continuous Infusions: . 0.9 % NaCl with KCl 20 mEq / L 75 mL/hr at 10/03/16 0946  . levETIRAcetam Stopped (10/03/16 0556)  . piperacillin-tazobactam (ZOSYN)  IV 3.375 g (10/03/16 1327)  . valproate sodium Stopped (10/03/16 1427)    Kennis Carina, MD [facility] Pager [phone]  If 7PM-7AM, please contact night-coverage [email or web address] Password [email or web address] 10/03/2016, 5:00 PM

## 2016-10-03 NOTE — Progress Notes (Signed)
Patient is unresponsive but has chewing motion all day. Right side of face and right shoulder appears red today.

## 2016-10-03 NOTE — Progress Notes (Signed)
[facility] is from [facility], CSW following for discharge needs.   Kristi BarrackNicole Jennaya Perry, Theresia MajorsLCSWA, MSW Clinical Social Worker 5E and Psychiatric Service Line [phone] 10/03/2016  9:37 AM

## 2016-10-04 ENCOUNTER — Inpatient Hospital Stay (HOSPITAL_COMMUNITY): Payer: Medicare Other

## 2016-10-04 LAB — CULTURE, BLOOD (ROUTINE X 2)
CULTURE: NO GROWTH
Culture: NO GROWTH
SPECIAL REQUESTS: ADEQUATE
Special Requests: ADEQUATE

## 2016-10-04 MED ORDER — ENSURE ENLIVE PO LIQD
237.0000 mL | Freq: Four times a day (QID) | ORAL | Status: DC
  Administered 2016-10-05: 237 mL

## 2016-10-04 NOTE — Progress Notes (Signed)
Nutrition Follow-up  DOCUMENTATION CODES:   Obesity unspecified  INTERVENTION:   -Monitor for advancement in diet   -If TF desired/required recommend:  Osmolite 1.2 @ 55 ml/hr 30 ml Prostat BID Provides: 1584 kcals, 73 grams protein, 1082 ml free water. This meets 100% of calorie needs and 104 % of protein needs.   NUTRITION DIAGNOSIS:   Inadequate oral intake related to inability to eat as evidenced by NPO status.  Ongoing  GOAL:   Patient will meet greater than or equal to 90% of their needs  Not meeting  MONITOR:   Diet advancement, Weight trends, Labs, Skin  REASON FOR ASSESSMENT:   Low Braden   ASSESSMENT:   51 y.o. female with medical history significant of seizures, CP, chronic pain syndrome, PNA and aspiration PNA.  Patient brought in to ED after apparent 20 min seizure at SNF.  Was given 2mg  ativan at SNF which didn't stop seizure.  Was given 2.5mg  versed by EMS which did stop seizure.  Patient is post-ictal and essentially unresponsive in ED and not able to contribute to history.  Pt unable to answer question at time of follow up. Spoke with RN regarding pt status. Per RN, pt out of comatose state, speaking full sentences to the night shift nurses last night. RN reports pt is hard to arose this morning. NGT to suction still in place with 90 ml of output.   Pt's diet looked to be upgraded 7/30 to dys 1 diet, to which she consumed 50% avg of two meals. 7/31 status changed back to NPO. Discussed nutrition options with RN who states she is waiting on speech evaluation. Pt has had inadequate intake, <50% of estimated energy requirements for 6 days. Will monitor for advancement in diet, or provide TF recommendations.   Medications reviewed and include: fentanyl, NaCl with KCl @ 75 ml/hr, IV abx  Labs reviewed: Cl 99 (L), Co2 38 (H), Calcium 8.6 (L), CBG 84-160  Diet Order:  Diet NPO time specified  Skin:  Wound (see comment) (Stage 2 R buttocks pressure  injury)  Last BM:  PTA/unknown  Height:   Ht Readings from Last 1 Encounters:  09/29/16 4\' 9"  (1.448 m)    Weight:   Wt Readings from Last 1 Encounters:  09/29/16 149 lb 11.1 oz (67.9 kg)    Ideal Body Weight:  41.45 kg  BMI:  Body mass index is 32.39 kg/m.  Estimated Nutritional Needs:   Kcal:  1495-1700 (22-25 kcal/kg)  Protein:  60-70 grams  Fluid:  >/= 1.6 L/day  EDUCATION NEEDS:   No education needs identified at this time  Vanessa Kickarly Catrell Morrone RD, LDN Clinical Nutrition Pager # - [phone]

## 2016-10-04 NOTE — Progress Notes (Signed)
Daily Progress Note   Patient Name: Maryclare LabradorWendy R Meaders       Date: 10/03/2016 DOB: 06/14/1965  Age: 51 y.o. MRN#: [id number] Attending Physician: Onnie BoerEmokpae, Ejiroghene E, MD Primary Care Physician: Angela Coxasanayaka, [name], MD Admit Date: 09/28/2016  Reason for Consultation/Follow-up: Establishing goals of care  Subjective: Lying in bed in no distress She now interacts and verbalizes some, but still not able to fully engage in conversation.  She does deny pain. See below:   Length of Stay: 4 Physical Exam               Eyes open, speaks a few words. Now able to consistently track and interact S1 S2 Clear Abdomen soft Trace edema  Vital Signs: BP 137/64 (BP Location: Left Arm)   Pulse (!) 59   Temp 99.3 F (37.4 C) (Axillary)   Resp 18   Ht 4\' 9"  (1.448 m)   Wt 67.9 kg (149 lb 11.1 oz)   LMP  (LMP Unknown) Comment: pt unresponsive   SpO2 100%   BMI 32.39 kg/m  SpO2: SpO2: 100 % O2 Device: O2 Device: Nasal Cannula O2 Flow Rate: O2 Flow Rate (L/min): 3 L/min  Intake/output summary:   Intake/Output Summary (Last 24 hours) at 10/04/16 1738 Last data filed at 10/04/16 1600  Gross per 24 hour  Intake             2700 ml  Output             1275 ml  Net             1425 ml   LBM: Last BM Date:  (Unknown as pt is not responsive) Baseline Weight: Weight: 81.6 kg (180 lb) Most recent weight: Weight: 67.9 kg (149 lb 11.1 oz)       Palliative Assessment/Data:    Flowsheet Rows     Most Recent Value  Intake Tab  Referral Department  Hospitalist  Unit at Time of Referral  Intermediate Care Unit  Palliative Care Primary Diagnosis  Other (Comment) [seizure, sepsis, pna, CP]  Palliative Care Type  New Palliative care  Reason for referral  Clarify Goals of Care  Date first seen by  Palliative Care  09/29/16  Clinical Assessment  Palliative Performance Scale Score  10%  Pain Max last 24 hours  2  Pain Min Last 24 hours  1  Psychosocial & Spiritual Assessment  Palliative Care Outcomes  Patient/Family meeting held?  No  Palliative Care Outcomes  Other (Comment) [call placed, unable to reach mother ]      Patient Active Problem List   Diagnosis Date Noted  . Pressure injury of skin 10/02/2016  . Post-ictal coma (HCC) 09/29/2016  . Sepsis (HCC) 11/20/2013  . Unspecified hypothyroidism 11/20/2013  . Seizures (HCC) 11/20/2013  . Aspiration pneumonia (HCC) 11/20/2013  . UTI (urinary tract infection) 11/20/2013  . Acute respiratory failure with hypoxia (HCC) 11/19/2013  . Infection due to ESBL-producing Escherichia coli 04/07/2012  . Thyroid nodule 04/07/2012  . Nephrolithiasis 04/05/2012  . Acute lower UTI 04/03/2012  . HCAP (healthcare-associated pneumonia) 02/27/2012  . Cerebral palsy (HCC) 02/27/2012    Palliative Care Assessment & Plan   Patient Profile:  51 year old female with history of cerebral palsy, seizures, aspiration pneumonia who came in after prolonged diminished lung seizure at the SNF Assessment: Post ictal coma and seizures HCAP causing acute respiratory failure with hypoxia Underlying cerebral palsy, bed bound state, functional quadriplegia, and seizure disorder Recommendations/Plan: Much more interactive today. Question if prior confusion was post-ictal and now improving with medication adjustments recommended by neurology.   Follow hospital course, monitor Po intake, monitor physical activity.   Pain: She appears comfortable this AM.  Denies pain.        Recommend SNF [facility] with palliative to follow on d/c.  I think she will likely [facility] which she knows as her [facility] environment.  Have attempted to call mother multiple times (as did hospitalist) over past several days.  She  is difficult to reach and reportedly has her own medical problems and cannot come to the hospital to visit.   Code Status:    Code Status Orders        Start     Ordered   09/29/16 0258  Do not attempt resuscitation (DNR)  Continuous    Question Answer Comment  In the event of cardiac or respiratory ARREST Do not call a "code blue"   In the event of cardiac or respiratory ARREST Do not perform Intubation, CPR, defibrillation or ACLS   In the event of cardiac or respiratory ARREST Use medication by any route, position, wound care, and other measures to relive pain and suffering. May use oxygen, suction and manual treatment of airway obstruction as needed for comfort.      09/29/16 0305    Code Status History    Date Active Date Inactive Code Status Order ID Comments User Context   11/20/2013  1:13 AM 11/23/2013  4:06 PM DNR [id number]  Haydee Monicaavid, Rachal A, MD ED   04/03/2012  9:06 AM 04/07/2012  4:13 PM Full Code [id number]  Orlando Pennerraegbunam, Ifeoma Kate, RN Inpatient   02/28/2012  2:00 AM 03/02/2012  7:25 PM DNR [id number]  Rolan Lipaallahan, Thomas Michael, NP Inpatient   02/27/2012  6:14 PM 02/28/2012  2:00 AM Full Code [id number]  Zerita BoersWhitaker, Lonnie R, RN Inpatient    Advance Directive Documentation     Most Recent Value  Type of Advance Directive  Out of facility DNR (pink MOST or yellow form)  Pre-existing out of facility DNR order (yellow form or pink MOST form)  Yellow form placed in chart (order not valid for inpatient use)  "MOST" Form in Place?  -       Prognosis:   guarded   Discharge Planning:  Recommend patient go back to her SNF with palliative on d/c.   Care plan was discussed with bedside RN and Dr. Mariea ClontsEmokpae.  Thank you for allowing the Palliative Medicine Team to assist in the care of this patient.  Total time: 20 minutes    Greater than 50%  of this time was spent counseling and coordinating care related to the above assessment and plan.  Romie MinusGene Hazley Dezeeuw, MD  Please contact  Palliative Medicine Team phone at [phone] for questions and concerns.   Romie MinusGene Kasi Lasky, MD [facility] Palliative Medicine Team [phone]

## 2016-10-04 NOTE — Progress Notes (Addendum)
Reason for consult:   Subjective:Patient clinically better, speaking in sentences.    ROS: Unable to obtain due to poor mental status  Examination  Vital signs in last 24 hours: Temp:  [98.2 F (36.8 C)-99.3 F (37.4 C)] 99.3 F (37.4 C) (08/03 1530) Pulse Rate:  [59-91] 59 (08/03 1530) Resp:  [18-20] 18 (08/03 1530) BP: (137-142)/(64-76) 137/64 (08/03 1530) SpO2:  [97 %-100 %] 100 % (08/03 1530)  General: Not in distress, cooperative CVS: pulse-normal rate and rhythm RS: breathing comfortably Extremities: normal   Neuro: Neurological Examination Mental Status: Awake, tracks, answers few questions. Not oriented to place Cranial Nerves: Pupils reactive to light, equal, Disconjugate gaze from esotropia left eye. Tracks to voice. face symmetric. Motor: Increased tone, contracted in all 4 extremities.  Sensory: Pinprick and light touch intact throughout, bilaterally Deep Tendon Reflexes: 2+  Plantars: down going  Unable to test gait and coordination   Basic Metabolic Panel:  Recent Labs Lab 09/29/16 0630 09/30/16 0355 10/01/16 0312 10/02/16 0306 10/02/16 1738  NA 143 136 143 141 143  K 3.6 3.0* 3.0* 2.9* 3.5  CL 107 98* 101 100* 99*  CO2 29 26 33* 33* 38*  GLUCOSE 160* 114* 117* 94 84  BUN 8 <5* 6 6 6   CREATININE 0.67 0.42* 0.34* 0.34* <0.30*  CALCIUM 7.1* 8.2* 8.8* 8.2* 8.6*  MG  --   --   --  1.8  --     CBC:  Recent Labs Lab 09/29/16 0029 09/29/16 0630 09/30/16 0355 10/01/16 0312 10/02/16 0306  WBC 27.4* 28.6* 16.1* 9.8 7.3  NEUTROABS 21.3*  --   --   --   --   HGB 17.2* 16.5* 15.6* 14.2 12.7  HCT 50.0* 49.0* 44.8 41.0 37.8  MCV 91.7 95.0 89.8 88.6 91.5  PLT 365 239 200 183 162     Coagulation Studies: No results for input(s): LABPROT, INR in the last 72 hours.  Imaging Reviewed:     ASSESSMENT AND PLAN  Cerebral Palsy  Refractory Epilepsy Seizures  Continue Seizures Medications at current doses:  Keppra 1 g BID Depakone   500 TID ( subtherapeutic dose at VPA level of 18) Lamtorigine 100 mg BID Clonazepam 0.5 mg daily   Do not recommend increasing dose of seizure meds further.We already have increased it and patient improving and low suspicion patient having subclinical seizures now-  increased dose of seizures meds can make patient more drowsy. However even after a few days, if patient is not back to baseline we can repeat EEG to see if we need to increase dose of AEDS. I agree with using antibiotics with lower risk of reducing seizure threshold.   Will continue to  follow.  Georgiana SpinnerSushanth Mee Macdonnell MD [facility] [phone]  If 7pm to 7am, please call on call as listed on AMION.

## 2016-10-04 NOTE — Progress Notes (Addendum)
PROGRESS NOTE  Kristi Perry [id number] DOB: 05/15/1965 DOA: 09/28/2016 PCP: Angela Coxasanayaka, [name], MD   LOS: 5 days   Brief Narrative / Interim history: 51 year old female with history of cerebral palsy, seizures, aspiration pneumonia who came in after prolonged seizure activity at the SNF.  She was admitted to the hospital in guarded prognosis being comatose and unresponsive, placed in stepdown and started on IV antiepileptics.  Patient was initially comatose, and it was felt like she will not survive the first 24 hours.  Palliative care was consulted at that time.  With antiepileptics IV as well as antibiotics, patient's mental status slowly improved and she is approaching baseline.  Assessment & Plan: Principal Problem:   Post-ictal coma (HCC) Active Problems:   HCAP (healthcare-associated pneumonia)   Acute lower UTI   Acute respiratory failure with hypoxia (HCC)   Sepsis (HCC)   Seizures (HCC)   Pressure injury of skin  Post ictal coma and seizures- mental status has improved today. Patient awake, alert, vocalizing today. Apparently said more earlier to nursing staff . - Neurology recommendations appreciated- home amitriptyline and clonazepam via NG - NG tube reinserted as patient failed bedside swallow by RN.  - Ensure for now, f/u Nutritionist recs - Reconsult ST with improvement in mental status - Previously on puree/thin diet as noted in ST note, evaluation not done due to patient's mental status - Continue Keppra and Depacon and IV forms for now. - Neurology following recs appreciated, does not recommend increasing dose of patient's anti-seizure medications Vaproic acid, suspicion for subclinical seizures, increased dose of seizure medications, make patient more drowsy.  - D/c if mental status continues to improve, speech eval, and blood cultures -ve 48 hours. - IVF   Sepsis due to HCAP/aspiration pneumonia causing acute respiratory failure with hypoxia. Repeat chest x-ray  today shows improving opacities. WBC stable. - With fever 8/2, blood cultures were redrawn . NGTD - At risk for recurrent aspiration, with comorbidities and bedbound status, and appears to have some dysphagia but was not fully evaluated by speech therapy due to mental status. - Agree with pharmacy recommendations to DC IV meropenem 7/29 >>8/1. As carbapenems lower seizure threshold - Continue IV antibiotic Zosyn per pharmacy 8/1 >> - cover possible aspiration pneumonia and HCAP.  UTI - Will d/c meropenem- lowers seizure threshold, now on Zosyn -  Urine cultures- ESBL Ecoli. Completed #3 days of meropenem. Fosfomycin X1 for complete dose - Pharmacist notes- unasyn and Zosyn will not cover ESBL in urine despite culture sensitivities, and recommended fosfomycin 1 dose to complete treatment. - Patient on Zosyn anyway for aspiration pneumonia .  Underlying cerebral palsy, bed bound state, functional quadriplegia, and seizure disorder -guarded prognosis overall -Palliative care following appreciate input,  - long term Resident of nursing home   Hypertension- stable  -For now use IV labetalol as needed  DVT prophylaxis: Lovenox Code Status: Full code Family Communication: attempted again to call mother 8/3, got voice mail Disposition Plan: possibly home tomorrow pending continued improvement in mental status and blood cultures negative for 48 hours  Consultants:   Palliative care  Neurology   Procedures:   None  Antimicrobials:  Vancomycin 7/29 >> 7/30   Meropenem 7/29 >> 8/1  Zosyn 8/1 >>  Subjective: Responded to simple questions how are you doing. Apparently earlier- Patient said she wants to go home, and asked what she is doing here.   Objective: Vitals:   10/03/16 1441 10/03/16 2056 10/04/16 0536 10/04/16 1530  BP: Marland Kitchen(!)  154/69 (!) 164/69 (!) 142/76 137/64  Pulse: 91 64 91 (!) 59  Resp: 20 20 20 18   Temp: 99.6 F (37.6 C) 98 F (36.7 C) 98.2 F (36.8 C) 99.3 F  (37.4 C)  TempSrc: Oral Oral Axillary Axillary  SpO2: 100% 98% 97% 100%  Weight:      Height:        Intake/Output Summary (Last 24 hours) at 10/04/16 1910 Last data filed at 10/04/16 1600  Gross per 24 hour  Intake             2700 ml  Output             1075 ml  Net             1625 ml   Filed Weights   09/29/16 0120 09/29/16 1200  Weight: 81.6 kg (180 lb) 67.9 kg (149 lb 11.1 oz)    Examination:  Vitals:   10/03/16 1441 10/03/16 2056 10/04/16 0536 10/04/16 1530  BP: (!) 154/69 (!) 164/69 (!) 142/76 137/64  Pulse: 91 64 91 (!) 59  Resp: 20 20 20 18   Temp: 99.6 F (37.6 C) 98 F (36.7 C) 98.2 F (36.8 C) 99.3 F (37.4 C)  TempSrc: Oral Oral Axillary Axillary  SpO2: 100% 98% 97% 100%  Weight:      Height:       Constitutional: No co-operative with exam Eyes: PERRL, lids and conjunctivae normal, likely injected Right eye appears to be deviating to the left. ENMT: Mucous membranes Respiratory: Clear breath sounds. Anterior auscultation. Equal. Normal respiratory effort.  Cardiovascular: Regular rate and rhythm, no murmurs / rubs / gallops. No LE edema.  Abdomen: no tenderness. Bowel sounds positive.  Neurologic: cerebral palsy, bedbound  Data Reviewed: I have independently reviewed following labs and imaging studies   CBC:  Recent Labs Lab 09/29/16 0029 09/29/16 0630 09/30/16 0355 10/01/16 0312 10/02/16 0306  WBC 27.4* 28.6* 16.1* 9.8 7.3  NEUTROABS 21.3*  --   --   --   --   HGB 17.2* 16.5* 15.6* 14.2 12.7  HCT 50.0* 49.0* 44.8 41.0 37.8  MCV 91.7 95.0 89.8 88.6 91.5  PLT 365 239 200 183 162   Basic Metabolic Panel:  Recent Labs Lab 09/29/16 0630 09/30/16 0355 10/01/16 0312 10/02/16 0306 10/02/16 1738  NA 143 136 143 141 143  K 3.6 3.0* 3.0* 2.9* 3.5  CL 107 98* 101 100* 99*  CO2 29 26 33* 33* 38*  GLUCOSE 160* 114* 117* 94 84  BUN 8 <5* 6 6 6   CREATININE 0.67 0.42* 0.34* 0.34* <0.30*  CALCIUM 7.1* 8.2* 8.8* 8.2* 8.6*  MG  --   --   --   1.8  --    Liver Function Tests:  Recent Labs Lab 09/29/16 0029 10/02/16 0306  AST 39 17  ALT 25 19  ALKPHOS 130* 54  BILITOT 0.6 0.6  PROT 7.5 5.2*  ALBUMIN 4.3 3.0*   Coagulation Profile:  Recent Labs Lab 09/29/16 0029  INR 1.39   CBG:  Recent Labs Lab 09/28/16 2340 09/29/16 0750 09/29/16 0812 09/29/16 1227 09/29/16 1638  GLUCAP 217* 155* 148* 134* 112*   Urine analysis:    Component Value Date/Time   COLORURINE YELLOW 09/29/2016 0054   APPEARANCEUR CLOUDY (A) 09/29/2016 0054   APPEARANCEUR Hazy 03/18/2012 1233   LABSPEC 1.012 09/29/2016 0054   LABSPEC 1.006 03/18/2012 1233   PHURINE 5.0 09/29/2016 0054   GLUCOSEU 50 (A) 09/29/2016 0054   GLUCOSEU Negative  03/18/2012 1233   HGBUR SMALL (A) 09/29/2016 0054   BILIRUBINUR NEGATIVE 09/29/2016 0054   BILIRUBINUR Negative 03/18/2012 1233   KETONESUR NEGATIVE 09/29/2016 0054   PROTEINUR 100 (A) 09/29/2016 0054   UROBILINOGEN 0.2 11/19/2013 2230   NITRITE NEGATIVE 09/29/2016 0054   LEUKOCYTESUR LARGE (A) 09/29/2016 0054   LEUKOCYTESUR 3+ 03/18/2012 1233   Sepsis Labs: Invalid input(s): PROCALCITONIN, LACTICIDVEN  Recent Results (from the past 240 hour(s))  Culture, blood (Routine x 2)     Status: None   Collection Time: 09/29/16 12:27 AM  Result Value Ref Range Status   Specimen Description BLOOD LEFT HAND  Final   Special Requests   Final    BOTTLES DRAWN AEROBIC AND ANAEROBIC Blood Culture adequate volume   Culture   Final    NO GROWTH 5 DAYS Performed at [facility], [address] [place], [place] [address]    Report Status 10/04/2016 FINAL  Final  Culture, blood (Routine x 2)     Status: None   Collection Time: 09/29/16 12:27 AM  Result Value Ref Range Status   Specimen Description BLOOD RIGHT ANTECUBITAL  Final   Special Requests   Final    BOTTLES DRAWN AEROBIC AND ANAEROBIC Blood Culture adequate volume   Culture   Final    NO GROWTH 5 DAYS Performed at [facility],  [address] [place], [place] [address]    Report Status 10/04/2016 FINAL  Final  Urine culture     Status: Abnormal   Collection Time: 09/29/16 12:53 AM  Result Value Ref Range Status   Specimen Description URINE, CATHETERIZED  Final   Special Requests NONE  Final   Culture (A)  Final    >=100,000 COLONIES/mL ESCHERICHIA COLI Confirmed Extended Spectrum Beta-Lactamase Producer (ESBL) Performed at [facility], [address], [place], [place] [address]    Report Status 10/01/2016 FINAL  Final   Organism ID, Bacteria ESCHERICHIA COLI (A)  Final      Susceptibility   Escherichia coli - MIC*    AMPICILLIN >=32 RESISTANT Resistant     CEFAZOLIN >=64 RESISTANT Resistant     CEFTRIAXONE >=64 RESISTANT Resistant     CIPROFLOXACIN >=4 RESISTANT Resistant     GENTAMICIN <=1 SENSITIVE Sensitive     IMIPENEM <=0.25 SENSITIVE Sensitive     NITROFURANTOIN <=16 SENSITIVE Sensitive     TRIMETH/SULFA <=20 SENSITIVE Sensitive     AMPICILLIN/SULBACTAM 8 SENSITIVE Sensitive     PIP/TAZO <=4 SENSITIVE Sensitive     Extended ESBL POSITIVE Resistant     * >=100,000 COLONIES/mL ESCHERICHIA COLI  MRSA PCR Screening     Status: None   Collection Time: 09/29/16  5:23 AM  Result Value Ref Range Status   MRSA by PCR NEGATIVE NEGATIVE Final    Comment:        The GeneXpert MRSA Assay (FDA approved for NASAL specimens only), is one component of a comprehensive MRSA colonization surveillance program. It is not intended to diagnose MRSA infection nor to guide or monitor treatment for MRSA infections.   Culture, blood (routine x 2)     Status: None (Preliminary result)   Collection Time: 10/03/16  8:06 AM  Result Value Ref Range Status   Specimen Description BLOOD LEFT ARM  Final   Special Requests IN PEDIATRIC BOTTLE Blood Culture adequate volume  Final   Culture   Final    NO GROWTH 1 DAY Performed at [facility], [address] [place], [place] [address]    Report Status PENDING   Incomplete  Culture, blood (routine x 2)     Status: None (Preliminary result)   Collection Time: 10/03/16  8:06 AM  Result Value Ref Range Status   Specimen Description BLOOD LEFT ARM  Final   Special Requests IN PEDIATRIC BOTTLE Blood Culture adequate volume  Final   Culture   Final    NO GROWTH 1 DAY Performed at [facility], [address] [place], [place] [address]    Report Status PENDING  Incomplete      Radiology Studies: Dg Chest Port 1 View  Result Date: 10/03/2016 CLINICAL DATA:  Altered mental status, recurrent fevers. EXAM: PORTABLE CHEST 1 VIEW COMPARISON:  Chest x-ray dated September 28, 2016. FINDINGS: Interval placement of an enteric tube with the tip and distal side port in the gastric body. The cardiomediastinal silhouette is normal. Patchy densities at the left lung base behind the heart appear slightly improved. No pleural effusion or pneumothorax. No acute osseous abnormality. IMPRESSION: Improved patchy density in the left lung behind the heart, likely related to resolving pneumonia and/or aspiration. No new consolidation. Electronically Signed   By: Obie DredgeWilliam T Derry M.D.   On: 10/03/2016 08:30   Dg Abd Portable 1v  Result Date: 10/02/2016 CLINICAL DATA:  Nasogastric tube placement. Cerebral palsy. Hiatal hernia. EXAM: PORTABLE ABDOMEN - 1 VIEW COMPARISON:  09/27/2015. Portable chest dated 09/28/2016. Abdomen pelvis CT dated 08/26/2012. FINDINGS: Normal bowel gas pattern. Nasogastric tube tip in the proximal to mid stomach and side hole in the proximal stomach. Probable suprapubic bladder catheter. Mild to moderate dextroconvex thoracolumbar scoliosis. Mild T12 and T9 vertebral compression deformities with no visible acute fracture lines, not present on 08/26/2012 and unchanged compared to 09/28/2016. There is also a mild L4 vertebral compression deformity without significant change since 08/26/2012. Diffuse osteopenia. IMPRESSION: 1. No acute abnormality. 2. Mild, old L4  vertebral compression deformity and probable old mild T9 and T12 vertebral compression deformities, new since 08/26/2012. Electronically Signed   By: Beckie SaltsSteven  Reid M.D.   On: 10/02/2016 19:46   Scheduled Meds: . chlorhexidine  15 mL Mouth Rinse BID  . clonazePAM  0.5 mg Oral QPC breakfast  . enoxaparin (LOVENOX) injection  40 mg Subcutaneous Q24H  . fentaNYL  25 mcg Transdermal Q72H  . lamoTRIgine  100 mg Oral BID  . mouth rinse  15 mL Mouth Rinse q12n4p   Continuous Infusions: . 0.9 % NaCl with KCl 20 mEq / L 75 mL/hr at 10/04/16 0115  . levETIRAcetam Stopped (10/04/16 1759)  . piperacillin-tazobactam (ZOSYN)  IV Stopped (10/04/16 1815)  . valproate sodium Stopped (10/04/16 1515)    Kennis CarinaEjiroghene Rafeal Skibicki, MD [facility] Pager [phone]  If 7PM-7AM, please contact night-coverage [email or web address] Password [email or web address] 10/04/2016, 7:10 PM

## 2016-10-05 DIAGNOSIS — R1310 Dysphagia, unspecified: Secondary | ICD-10-CM

## 2016-10-05 DIAGNOSIS — G40401 Other generalized epilepsy and epileptic syndromes, not intractable, with status epilepticus: Secondary | ICD-10-CM

## 2016-10-05 DIAGNOSIS — A4151 Sepsis due to Escherichia coli [E. coli]: Secondary | ICD-10-CM

## 2016-10-05 DIAGNOSIS — J690 Pneumonitis due to inhalation of food and vomit: Secondary | ICD-10-CM

## 2016-10-05 DIAGNOSIS — J9600 Acute respiratory failure, unspecified whether with hypoxia or hypercapnia: Secondary | ICD-10-CM

## 2016-10-05 LAB — BASIC METABOLIC PANEL
ANION GAP: 11 (ref 5–15)
BUN: 11 mg/dL (ref 6–20)
CO2: 27 mmol/L (ref 22–32)
Calcium: 8.8 mg/dL — ABNORMAL LOW (ref 8.9–10.3)
Chloride: 105 mmol/L (ref 101–111)
Creatinine, Ser: 0.32 mg/dL — ABNORMAL LOW (ref 0.44–1.00)
GFR calc Af Amer: >60 mL/min (ref >60)
GFR calc non Af Amer: >60 mL/min (ref >60)
GLUCOSE: 71 mg/dL (ref 65–99)
POTASSIUM: 3.9 mmol/L (ref 3.5–5.1)
SODIUM: 143 mmol/L (ref 135–145)

## 2016-10-05 LAB — GLUCOSE, CAPILLARY
GLUCOSE-CAPILLARY: 86 mg/dL (ref 65–99)
Glucose-Capillary: 109 mg/dL — ABNORMAL HIGH (ref 65–99)

## 2016-10-05 MED ORDER — JEVITY 1.2 CAL PO LIQD: 1000.0000 mL | ORAL | Status: DC

## 2016-10-05 MED ORDER — ARFORMOTEROL TARTRATE 15 MCG/2ML IN NEBU
15.0000 ug | INHALATION_SOLUTION | Freq: Two times a day (BID) | RESPIRATORY_TRACT | Status: DC
Start: 2016-10-05 — End: 2016-10-10
  Administered 2016-10-05 – 2016-10-10 (×10): 15 ug via RESPIRATORY_TRACT
  Filled 2016-10-05 (×10): qty 2

## 2016-10-05 MED ORDER — PRO-STAT SUGAR FREE PO LIQD
30.0000 mL | Freq: Two times a day (BID) | ORAL | Status: DC
  Administered 2016-10-05 – 2016-10-06 (×2): 30 mL
  Filled 2016-10-05 (×2): qty 30

## 2016-10-05 MED ORDER — LORAZEPAM 2 MG/ML IJ SOLN
0.5000 mg | Freq: Once | INTRAMUSCULAR | Status: AC
  Administered 2016-10-06: 0.5 mg via INTRAVENOUS
  Filled 2016-10-05: qty 1

## 2016-10-05 MED ORDER — OSMOLITE 1.2 CAL PO LIQD
1000.0000 mL | ORAL | Status: DC
  Administered 2016-10-05 – 2016-10-06 (×2): 1000 mL
  Filled 2016-10-05 (×2): qty 1000

## 2016-10-05 NOTE — Progress Notes (Signed)
Pt. Is yelling out in bed screaming that her TV went out and that it is deader than a doornail and now she doesn't have anything to watch. Reassured pt. That the tv is not out but she is continuously repeating the same thing over and over. Turned the volume up on her tv and turned the lights on but this did not help.

## 2016-10-05 NOTE — Evaluation (Signed)
Clinical/Bedside Swallow Evaluation Patient Details  Name: Kristi HawthorneWendy R Cupo MRN: [id number] Date of Birth: 04/03/1965  Today's Date: 10/05/2016 Time: SLP Start Time (ACUTE ONLY): 1347 SLP Stop Time (ACUTE ONLY): 1355 SLP Time Calculation (min) (ACUTE ONLY): 8 min  Past Medical History:  Past Medical History:  Diagnosis Date  . Acute respiratory failure (HCC)   . Allergic rhinitis   . Anxiety   . Cerebral palsy (HCC)   . Chronic airway obstruction (HCC)   . Chronic pain syndrome   . Depression   . DVT (deep vein thrombosis) in pregnancy (HCC)   . Dysphagia   . Dysphasia   . Epilepsy (HCC)   . Hiatal hernia   . Hyperlipidemia   . Hypertension   . Hypothyroidism   . Incontinent of feces 09-25-11   hx.  . Incontinent of urine 09-25-11   hx.  . Migraine   . Mild intellectual disabilities   . Morbid obesity (HCC) 09-25-11   requires Hoyer lift. pt. doesn't stand or ambulate.  . Muscle weakness   . Nephrolithiasis 04/05/2012  . Neurogenic bladder   . Pneumonia    hx of asp pneumonia 1/11-1/19/12  . Sacral decubitus ulcer    hx of  . Seizures (HCC)   . Sepsis(995.91)   . Sepsis(995.91)    hx of   . Unspecified psychosis   . Vitamin D deficiency    Past Surgical History:  Past Surgical History:  Procedure Laterality Date  . CYSTOSCOPY WITH URETEROSCOPY  11/07/2011   Procedure: CYSTOSCOPY WITH URETEROSCOPY;  Surgeon: Marcine MatarStephen Dahlstedt, MD;  Location: [facility];  Service: Urology;  Laterality: Right;  Removal of right double J stent, Insertion right double J stent  . NEPHROLITHOTOMY  10/07/2011   Procedure: NEPHROLITHOTOMY PERCUTANEOUS;  Surgeon: Marcine MatarStephen Dahlstedt, MD;  Location: [facility];  Service: Urology;  Laterality: Right;  kidney   . NEPHROLITHOTOMY  12/06/2011   Procedure: NEPHROLITHOTOMY PERCUTANEOUS;  Surgeon: Marcine MatarStephen Dahlstedt, MD;  Location: [facility];  Service: Urology;  Laterality: Right;   REPEAT PCNL   . STONE EXTRACTION WITH BASKET  11/07/2011   Procedure: STONE EXTRACTION  WITH BASKET;  Surgeon: Marcine MatarStephen Dahlstedt, MD;  Location: [facility];  Service: Urology;  Laterality: Right;   HPI:  51 yo adm from [facility] with seizures.  PMH + for CP, asp pna, functional quadriparesis - bedbound.  Swallow eval ordered.  Per review of soft chart, pt on puree/thin at SNF.  Pt has not had prior imaging swallow studies.  She was placed on a dys1/thin diet clinically = with intially good tolerance.  Pt became lethargic and now has NG tube for medications as she can not have po intake.  Initial SLP evaluation this admission 7/30 recommended Dys 1 diet and nectar thick liquids but pt become more lethargic and less responsive, therefore SLP signed off and pt was kept NPO. SLP was re-ordered due to improvements noted in mentation.   Assessment / Plan / Recommendation Clinical Impression  SLP was re-ordered to assess swallowing due to improvements noted in mentation. Pt is alert and responding inconsistently to commands. Her voice is very wet at baseline and she does not cough on command to clear it despite multimodal cueing. Pt then started to say "hurts bad" in response to cues for coughing. SLP presented pt with minimal amounts of water via swab with minimal lingual/labial response. She did appear to have a delayed swallow, although I do not think she obtained any significant amount of liquid. Pt seems  to be having difficulty managing her secretions at this time. Continue to recommend NPO status - will follow along for any improvements in swallowing function. SLP Visit Diagnosis: Dysphagia, oropharyngeal phase (R13.12)    Aspiration Risk  Severe aspiration risk;Risk for inadequate nutrition/hydration    Diet Recommendation NPO;Alternative means - temporary (may need to discuss GOC about artificial feeding longer term)   Medication Administration: Via alternative means    Other  Recommendations Oral Care Recommendations: Oral care QID Other Recommendations: Have oral suction  available   Follow up Recommendations Skilled Nursing facility      Frequency and Duration min 2x/week  2 weeks       Prognosis Prognosis for Safe Diet Advancement: Fair Barriers to Reach Goals: Cognitive deficits;Time post onset;Severity of deficits      Swallow Study   General Date of Onset: 09/30/16 HPI: 51 yo adm from [facility] with seizures.  PMH + for CP, asp pna, functional quadriparesis - bedbound.  Swallow eval ordered.  Per review of soft chart, pt on puree/thin at SNF.  Pt has not had prior imaging swallow studies.  She was placed on a dys1/thin diet clinically = with intially good tolerance.  Pt became lethargic and now has NG tube for medications as she can not have po intake.  Initial SLP evaluation this admission 7/30 recommended Dys 1 diet and nectar thick liquids but pt become more lethargic and less responsive, therefore SLP signed off and pt was kept NPO. SLP was re-ordered due to improvements noted in mentation. Type of Study: Bedside Swallow Evaluation Previous Swallow Assessment: see HPI Diet Prior to this Study: NPO;NG Tube (TF just started from NGT) Temperature Spikes Noted: No Respiratory Status: Nasal cannula History of Recent Intubation: No Behavior/Cognition: Alert;Requires cueing;Other (Comment) (inconsistently follows commands) Oral Cavity Assessment: Dry Oral Care Completed by SLP: No Oral Cavity - Dentition: Edentulous Self-Feeding Abilities: Total assist Patient Positioning: Postural control interferes with function (reduced head control, tilts to pt's left) Baseline Vocal Quality: Low vocal intensity;Wet Volitional Cough: Cognitively unable to elicit;Other (Comment) ("hurts bad")    Oral/Motor/Sensory Function Overall Oral Motor/Sensory Function: Generalized oral weakness   Ice Chips Ice chips: Not tested   Thin Liquid Thin Liquid: Impaired Presentation:  (swab) Oral Phase Impairments: Reduced lingual movement/coordination;Reduced  labial seal Oral Phase Functional Implications: Left anterior spillage Pharyngeal  Phase Impairments: Suspected delayed Swallow    Nectar Thick Nectar Thick Liquid: Not tested   Honey Thick Honey Thick Liquid: Not tested   Puree Puree: Not tested   Solid   GO   Solid: Not tested        Maxcine Hamaiewonsky, Miette Molenda 10/05/2016,2:11 PM   Maxcine HamLaura Paiewonsky, M.A. CCC-SLP [phone]

## 2016-10-05 NOTE — Clinical Social Work Note (Signed)
CSW received call from MD stating unable to get in touch with pt's mom-Kristi Perry and she is needed for pt decisions. MD asked for welfare check, as pt's mother has been sick. CSW called pt's mom at [phone] (disconnected), [phone] (left VM). CSW received call back from pt's mom stating she'd spoken with MD. CSW continuing to follow for discharge needs.   Corlis HoveJeneya Amiera Perry, LCSWA, LCASA Clinical Social Work [phone]

## 2016-10-05 NOTE — Progress Notes (Signed)
Brief Nutrition Note  Consult received for enteral/tube feeding initiation and management.  RD operating remotely. Adult Enteral Nutrition Protocol initiated. Patient had been followed up on yesterday afternoon with the following recommendations:   Osmolite 1.2 @ 55 ml/hr 30 ml Prostat BID Provides: 1584 kcals, 73 grams protein, 1082 ml free water. This meets 100% of calorie needs and 104 % of protein needs.   Will implement these with the protocol   RD will follow up and monitor dietary tolerance.  Admitting Dx: Seizure (HCC) [R56.9] Fever [R50.9] Unresponsive [R41.89]  Body mass index is 32.39 kg/m. Pt meets criteria for Obese based on current BMI.  Labs:   Recent Labs Lab 10/02/16 0306 10/02/16 1738 10/05/16 0558  NA 141 143 143  K 2.9* 3.5 3.9  CL 100* 99* 105  CO2 33* 38* 27  BUN 6 6 11   CREATININE 0.34* <0.30* 0.32*  CALCIUM 8.2* 8.6* 8.8*  MG 1.8  --   --   GLUCOSE 94 84 71   Kristi Perry RD, LDN, CNSC Clinical Nutrition Pager: [phone] 10/05/2016 11:42 AM

## 2016-10-05 NOTE — Progress Notes (Signed)
Daily Progress Note   Patient Name: Kristi Perry       Date: 10/03/2016 DOB: 02/16/1966  Age: 51 y.o. MRN#: [id number] Attending Physician: Onnie BoerEmokpae, Ejiroghene E, MD Primary Care Physician: Angela Coxasanayaka, [name], MD Admit Date: 09/28/2016  Reason for Consultation/Follow-up: Establishing goals of care  Subjective: Lying in bed in no distress Mumbles, but does not seem to answer as clearly as yesterday  See below:   Length of Stay: 4 Physical Exam               Eyes open, mumbles inchoerently. Now able to consistently track and interact S1 S2 Clear Abdomen soft Trace edema  Vital Signs: BP 136/65 (BP Location: Left Wrist)   Pulse (!) 55   Temp 98.5 F (36.9 C) (Oral)   Resp 20   Ht 4\' 9"  (1.448 m)   Wt 67.9 kg (149 lb 11.1 oz)   LMP  (LMP Unknown) Comment: pt unresponsive   SpO2 97%   BMI 32.39 kg/m  SpO2: SpO2: 97 % O2 Device: O2 Device: Nasal Cannula O2 Flow Rate: O2 Flow Rate (L/min): 3 L/min  Intake/output summary:   Intake/Output Summary (Last 24 hours) at 10/05/16 1926 Last data filed at 10/05/16 1700  Gross per 24 hour  Intake          2327.83 ml  Output              650 ml  Net          1677.83 ml   LBM: Last BM Date:  (Unknown as pt is not responsive) Baseline Weight: Weight: 81.6 kg (180 lb) Most recent weight: Weight: 67.9 kg (149 lb 11.1 oz)       Palliative Assessment/Data:    Flowsheet Rows     Most Recent Value  Intake Tab  Referral Department  Hospitalist  Unit at Time of Referral  Intermediate Care Unit  Palliative Care Primary Diagnosis  Other (Comment) [seizure, sepsis, pna, CP]  Palliative Care Type  New Palliative care  Reason for referral  Clarify Goals of Care  Date first seen by Palliative Care  09/29/16  Clinical Assessment  Palliative  Performance Scale Score  10%  Pain Max last 24 hours  2  Pain Min Last 24 hours  1  Psychosocial & Spiritual Assessment  Palliative Care Outcomes  Patient/Family meeting held?  No  Palliative Care Outcomes  Other (Comment) [call placed, unable to reach mother ]      Patient Active Problem List   Diagnosis Date Noted  . Aspiration pneumonia of left lower lobe (HCC) 10/05/2016  . Dysphagia   . Pressure injury of skin 10/02/2016  . Post-ictal coma (HCC) 09/29/2016  . Sepsis (HCC) 11/20/2013  . Unspecified hypothyroidism 11/20/2013  . Seizures (HCC) 11/20/2013  . Aspiration pneumonia (HCC) 11/20/2013  . UTI (urinary tract infection) 11/20/2013  . Acute respiratory failure with hypoxia (HCC) 11/19/2013  . Infection due to ESBL-producing Escherichia coli 04/07/2012  . Thyroid nodule 04/07/2012  . Nephrolithiasis 04/05/2012  . Acute lower UTI 04/03/2012  . HCAP (healthcare-associated pneumonia) 02/27/2012  . Cerebral palsy (HCC) 02/27/2012    Palliative Care Assessment & Plan   Patient Profile:  51 year old  female with history of cerebral palsy, seizures, aspiration pneumonia who came in after prolonged diminished lung seizure at the SNF Assessment: Post ictal coma and seizures HCAP causing acute respiratory failure with hypoxia Underlying cerebral palsy, bed bound state, functional quadriplegia, and seizure disorder Recommendations/Plan:  Less interactive today.   Discussed with Dr. Arbutus Leasat.  Agree that recurrent aspiration and nutrition are going to continue to be concerns moving forward.  He has discussed with mother today who has limited insight into her condition (has not physically seen her for 3 months) and is requesting PEG tube.  I have called twice today (late this AM and again at 7:25PM) and left messages for mother.  Hopeful I can reach her or she will call back tomorrow.     Pain: She appears comfortable this AM.  Denies pain.          Code Status:    Code Status  Orders        Start     Ordered   09/29/16 0258  Do not attempt resuscitation (DNR)  Continuous    Question Answer Comment  In the event of cardiac or respiratory ARREST Do not call a "code blue"   In the event of cardiac or respiratory ARREST Do not perform Intubation, CPR, defibrillation or ACLS   In the event of cardiac or respiratory ARREST Use medication by any route, position, wound care, and other measures to relive pain and suffering. May use oxygen, suction and manual treatment of airway obstruction as needed for comfort.      09/29/16 0305    Code Status History    Date Active Date Inactive Code Status Order ID Comments User Context   11/20/2013  1:13 AM 11/23/2013  4:06 PM DNR [id number]  Haydee Monicaavid, Rachal A, MD ED   04/03/2012  9:06 AM 04/07/2012  4:13 PM Full Code [id number]  Orlando Pennerraegbunam, Ifeoma Kate, RN Inpatient   02/28/2012  2:00 AM 03/02/2012  7:25 PM DNR [id number]  Rolan Lipaallahan, Thomas Michael, NP Inpatient   02/27/2012  6:14 PM 02/28/2012  2:00 AM Full Code [id number]  Zerita BoersWhitaker, Lonnie R, RN Inpatient    Advance Directive Documentation     Most Recent Value  Type of Advance Directive  Out of facility DNR (pink MOST or yellow form)  Pre-existing out of facility DNR order (yellow form or pink MOST form)  Yellow form placed in chart (order not valid for inpatient use)  "MOST" Form in Place?  -       Prognosis:   guarded   Discharge Planning:  To be determined  Care plan was discussed with bedside RN and Dr. Arbutus Leasat  Thank you for allowing the Palliative Medicine Team to assist in the care of this patient.  Total time: 20 minutes    Greater than 50%  of this time was spent counseling and coordinating care related to the above assessment and plan.  Romie MinusGene Casondra Gasca, MD  Please contact Palliative Medicine Team phone at [phone] for questions and concerns.   Romie MinusGene Ordell Prichett, MD [facility] Palliative [facility] Team [phone]

## 2016-10-05 NOTE — Progress Notes (Signed)
Page sent to oncall about the pt. Being agitated. Asked her if she were in pain and she would only respond that her tv went out.

## 2016-10-05 NOTE — Progress Notes (Signed)
PROGRESS NOTE  Kristi LabradorWendy R Banfill VOZ:[id number] DOB: 03/30/1965 DOA: 09/28/2016 PCP: Angela Coxasanayaka, [name], MD  Brief History:  51 year old female with cerebral palsy, seizure disorder, aspiration pneumonia, and functional quadriparesis presented to the emergency department after a 20 minute seizure at her skilled nursing facility. Apparently, the patient was given Ativan 2 mg, but she continued to seize. EMS gave Versed 2.5 mg with cessation of the seizure. In the emergency department, the patient was postictal and minimally responsive. She was placed in the stepdown unit and started on intravenous antiepileptic medications. Neurology was consulted to assist. In addition, palliative medicine was consulted as it was initially felt that the patient may not survive.  The patient was noted to have an ESBL Escherichia coli UTI.  She was not associated with meropenem for 3 days and subsequently received one dose of fosfomycin to complete therapy. During her hospital stay, the patient has had a waxing and waning episodes of alertness.   Assessment/Plan: Acute metabolic encephalopathy  -Multifactorial including infection, postictal from seizure state, UTI, and possible hyperammonemia  -Appreciate neurology follow-up  -Continue  AEDs per neurology -10/05/16--awake but not answering appropriately -check ammonia as valproate can cause hyperammonemia -check TSH, B12  Sepsis  -presented with fever 102.3 with WBC 27.4, lactic acid 5.55>>>1.0 -secondary to UTI and aspiration -sepsis physiology resolved  Aspiration pneumonia/Acute respiratory failure with hypoxia -pt has retained oral secretions with difficulty clearing saliva -she has scattered rhonchi -continue zosyn -appreciate speech evaluation-->recommend NPO -start enteral feeding through NG  -restart home brovana  UTI -ESBL EColi -finished 6 days of abx  Seizure Disorder -continue AEDs per neurology -repeat EEG if no improvement in  mental status -Continue Keppra 1 g twice a day, Depakote 500 mg 3 times a day, Lamictal 100 mg twice a day, Klonopin 0.5 mg daily -10/01/2016 EEG--Occasional epileptiform discharges over the left temporal region; background slowing with asymmetry, left greater than right   Underlying cerebral palsy, bed bound state, functional quadriplegia -essentially bed bound status -palliative medicine following  Goals of Care -10/05/16--discussed with pt's mother--she has not seen the patient for > 3 months -mother seems to have poor insight into pt's overall condition -mother wanted PEG, but I encouraged her to speak with Dr. Neale BurlyFreeman before moving forward -mother confirms DNR       Disposition Plan:  Not stable for discharge Family Communication:   Mother updated on phone--Total time spent 40 minutes.  Greater than 50% spent face to face counseling and coordinating care.   Consultants:  Palliative medicine, neurology  Code Status:  DNR  DVT Prophylaxis: [place] Lovenox   Procedures: As Listed in Progress Note Above  Antibiotics: Zosyn 8/1>>> merrem 7/29>>>8/1 Fosfomycin 8/1 x 1    Subjective: Patient remains confused. She is awake and alert. She does not follow commands. She repeats" I am wet" with all questions.  No vomiting, respiratory distress, no uncontrolled pain, diarrhea reported from nursing  Objective: Vitals:   10/04/16 1530 10/04/16 2300 10/05/16 0701 10/05/16 1500  BP: 137/64 (!) 153/60 (!) 148/72 136/65  Pulse: (!) 59 (!) 58 60 (!) 55  Resp: 18 18 18 20   Temp: 99.3 F (37.4 C) 98.7 F (37.1 C) 99.6 F (37.6 C) 98.5 F (36.9 C)  TempSrc: Axillary Axillary Axillary Oral  SpO2: 100% 99% 99% 97%  Weight:      Height:        Intake/Output Summary (Last 24 hours) at 10/05/16 1700 Last data  filed at 10/05/16 1500  Gross per 24 hour  Intake          2327.83 ml  Output                0 ml  Net          2327.83 ml   Weight change:  Exam:   General:  Pt is  alert, does not follow commands appropriately, not in acute distress  HEENT: No icterus, No thrush, No neck mass, [place]/AT  Cardiovascular: RRR, S1/S2, no rubs, no gallops  Respiratory: Bilateral scattered rhonchi. No wheezing. Good air movement.  Abdomen: Soft/+BS, non tender, non distended, no guarding  Extremities: No edema, No lymphangitis, No petechiae, No rashes, no synovitis   Data Reviewed: I have personally reviewed following labs and imaging studies Basic Metabolic Panel:  Recent Labs Lab 09/30/16 0355 10/01/16 0312 10/02/16 0306 10/02/16 1738 10/05/16 0558  NA 136 143 141 143 143  K 3.0* 3.0* 2.9* 3.5 3.9  CL 98* 101 100* 99* 105  CO2 26 33* 33* 38* 27  GLUCOSE 114* 117* 94 84 71  BUN <5* 6 6 6 11   CREATININE 0.42* 0.34* 0.34* <0.30* 0.32*  CALCIUM 8.2* 8.8* 8.2* 8.6* 8.8*  MG  --   --  1.8  --   --    Liver Function Tests:  Recent Labs Lab 09/29/16 0029 10/02/16 0306  AST 39 17  ALT 25 19  ALKPHOS 130* 54  BILITOT 0.6 0.6  PROT 7.5 5.2*  ALBUMIN 4.3 3.0*   No results for input(s): LIPASE, AMYLASE in the last 168 hours. No results for input(s): AMMONIA in the last 168 hours. Coagulation Profile:  Recent Labs Lab 09/29/16 0029  INR 1.39   CBC:  Recent Labs Lab 09/29/16 0029 09/29/16 0630 09/30/16 0355 10/01/16 0312 10/02/16 0306  WBC 27.4* 28.6* 16.1* 9.8 7.3  NEUTROABS 21.3*  --   --   --   --   HGB 17.2* 16.5* 15.6* 14.2 12.7  HCT 50.0* 49.0* 44.8 41.0 37.8  MCV 91.7 95.0 89.8 88.6 91.5  PLT 365 239 200 183 162   Cardiac Enzymes: No results for input(s): CKTOTAL, CKMB, CKMBINDEX, TROPONINI in the last 168 hours. BNP: Invalid input(s): POCBNP CBG:  Recent Labs Lab 09/28/16 2340 09/29/16 0750 09/29/16 0812 09/29/16 1227 09/29/16 1638  GLUCAP 217* 155* 148* 134* 112*   HbA1C: No results for input(s): HGBA1C in the last 72 hours. Urine analysis:    Component Value Date/Time   COLORURINE YELLOW 09/29/2016 0054    APPEARANCEUR CLOUDY (A) 09/29/2016 0054   APPEARANCEUR Hazy 03/18/2012 1233   LABSPEC 1.012 09/29/2016 0054   LABSPEC 1.006 03/18/2012 1233   PHURINE 5.0 09/29/2016 0054   GLUCOSEU 50 (A) 09/29/2016 0054   GLUCOSEU Negative 03/18/2012 1233   HGBUR SMALL (A) 09/29/2016 0054   BILIRUBINUR NEGATIVE 09/29/2016 0054   BILIRUBINUR Negative 03/18/2012 1233   KETONESUR NEGATIVE 09/29/2016 0054   PROTEINUR 100 (A) 09/29/2016 0054   UROBILINOGEN 0.2 11/19/2013 2230   NITRITE NEGATIVE 09/29/2016 0054   LEUKOCYTESUR LARGE (A) 09/29/2016 0054   LEUKOCYTESUR 3+ 03/18/2012 1233   Sepsis Labs: @LABRCNTIP (procalcitonin:4,lacticidven:4) ) Recent Results (from the past 240 hour(s))  Culture, blood (Routine x 2)     Status: None   Collection Time: 09/29/16 12:27 AM  Result Value Ref Range Status   Specimen Description BLOOD LEFT HAND  Final   Special Requests   Final    BOTTLES DRAWN AEROBIC AND ANAEROBIC Blood  Culture adequate volume   Culture   Final    NO GROWTH 5 DAYS Performed at [facility], [address] [place], [place] [address]    Report Status 10/04/2016 FINAL  Final  Culture, blood (Routine x 2)     Status: None   Collection Time: 09/29/16 12:27 AM  Result Value Ref Range Status   Specimen Description BLOOD RIGHT ANTECUBITAL  Final   Special Requests   Final    [facility] Blood Culture adequate volume   Culture   Final    NO GROWTH 5 DAYS Performed at [facility], [address] [place], [place] [address]    Report Status 10/04/2016 FINAL  Final  Urine culture     Status: Abnormal   Collection Time: 09/29/16 12:53 AM  Result Value Ref Range Status   Specimen Description URINE, CATHETERIZED  Final   Special Requests NONE  Final   Culture (A)  Final    >=100,000 COLONIES/mL ESCHERICHIA COLI Confirmed Extended Spectrum Beta-Lactamase Producer (ESBL) Performed at [facility], [address] [place], [place] [address]    Report  Status 10/01/2016 FINAL  Final   Organism ID, Bacteria ESCHERICHIA COLI (A)  Final      Susceptibility   Escherichia coli - MIC*    AMPICILLIN >=32 RESISTANT Resistant     CEFAZOLIN >=64 RESISTANT Resistant     CEFTRIAXONE >=64 RESISTANT Resistant     CIPROFLOXACIN >=4 RESISTANT Resistant     GENTAMICIN <=1 SENSITIVE Sensitive     IMIPENEM <=0.25 SENSITIVE Sensitive     NITROFURANTOIN <=16 SENSITIVE Sensitive     TRIMETH/SULFA <=20 SENSITIVE Sensitive     AMPICILLIN/SULBACTAM 8 SENSITIVE Sensitive     PIP/TAZO <=4 SENSITIVE Sensitive     Extended ESBL POSITIVE Resistant     * >=100,000 COLONIES/mL ESCHERICHIA COLI  MRSA PCR Screening     Status: None   Collection Time: 09/29/16  5:23 AM  Result Value Ref Range Status   MRSA by PCR NEGATIVE NEGATIVE Final    Comment:        The GeneXpert MRSA Assay (FDA approved for NASAL specimens only), is one component of a comprehensive MRSA colonization surveillance program. It is not intended to diagnose MRSA infection nor to guide or monitor treatment for MRSA infections.   Culture, blood (routine x 2)     Status: None (Preliminary result)   Collection Time: 10/03/16  8:06 AM  Result Value Ref Range Status   Specimen Description BLOOD LEFT ARM  Final   Special Requests IN PEDIATRIC BOTTLE Blood Culture adequate volume  Final   Culture   Final    NO GROWTH 2 DAYS Performed at [facility], [address] [place], [place] [address]    Report Status PENDING  Incomplete  Culture, blood (routine x 2)     Status: None (Preliminary result)   Collection Time: 10/03/16  8:06 AM  Result Value Ref Range Status   Specimen Description BLOOD LEFT ARM  Final   Special Requests IN PEDIATRIC BOTTLE Blood Culture adequate volume  Final   Culture   Final    NO GROWTH 2 DAYS Performed at [facility], [address] [place], [place] [address]    Report Status PENDING  Incomplete     Scheduled Meds: . chlorhexidine  15 mL Mouth  Rinse BID  . clonazePAM  0.5 mg Oral QPC breakfast  . enoxaparin (LOVENOX)  injection  40 mg Subcutaneous Q24H  . feeding supplement (PRO-STAT SUGAR FREE 64)  30 mL Per Tube BID  . fentaNYL  25 mcg Transdermal Q72H  . lamoTRIgine  100 mg Oral BID  . mouth rinse  15 mL Mouth Rinse q12n4p   Continuous Infusions: . 0.9 % NaCl with KCl 20 mEq / L 75 mL/hr at 10/05/16 0612  . feeding supplement (OSMOLITE 1.2 CAL) 1,000 mL (10/05/16 1346)  . levETIRAcetam Stopped (10/05/16 0981)  . piperacillin-tazobactam (ZOSYN)  IV 3.375 g (10/05/16 1346)  . valproate sodium Stopped (10/05/16 1446)    Procedures/Studies: Ct Head Wo Contrast  Result Date: 09/29/2016 CLINICAL DATA:  Seizures tonight.  Postictal at this time. EXAM: CT HEAD WITHOUT CONTRAST TECHNIQUE: Contiguous axial images were obtained from the base of the skull through the vertex without intravenous contrast. COMPARISON:  08/26/2012 FINDINGS: Brain: There is no intracranial hemorrhage, mass or evidence of acute infarction. There is mild generalized atrophy. There is moderate chronic microvascular ischemic change. There is no significant extra-axial fluid collection. No acute intracranial findings are evident. Vascular: No hyperdense vessel or unexpected calcification. Skull: Normal. Negative for fracture or focal lesion. Sinuses/Orbits: No acute finding. Other: None. IMPRESSION: No acute intracranial findings. There is mild generalized atrophy and moderate chronic white matter hypodensity which likely represents small vessel ischemic disease. Electronically Signed   By: Ellery Plunk M.D.   On: 09/29/2016 04:01   Dg Chest Port 1 View  Result Date: 10/03/2016 CLINICAL DATA:  Altered mental status, recurrent fevers. EXAM: PORTABLE CHEST 1 VIEW COMPARISON:  Chest x-ray dated September 28, 2016. FINDINGS: Interval placement of an enteric tube with the tip and distal side port in the gastric body. The cardiomediastinal silhouette is normal. Patchy  densities at the left lung base behind the heart appear slightly improved. No pleural effusion or pneumothorax. No acute osseous abnormality. IMPRESSION: Improved patchy density in the left lung behind the heart, likely related to resolving pneumonia and/or aspiration. No new consolidation. Electronically Signed   By: Obie Dredge M.D.   On: 10/03/2016 08:30   Dg Chest Port 1 View  Result Date: 09/29/2016 CLINICAL DATA:  Seizure activity.  Fever. EXAM: PORTABLE CHEST 1 VIEW COMPARISON:  11/19/2013 FINDINGS: Shallow inspiration. Elevation of the left hemidiaphragm. Consolidation or atelectasis in the left lung base behind the heart. This could indicate pneumonia. Blunting of the left costophrenic angle suggests a small pleural effusion. No pneumothorax. Normal heart size and pulmonary vascularity. IMPRESSION: Shallow inspiration with elevation of the left hemidiaphragm. Consolidation and effusion in the left base may represent pneumonia. Electronically Signed   By: Burman Nieves M.D.   On: 09/29/2016 01:14   Dg Abd Portable 1v  Result Date: 10/04/2016 CLINICAL DATA:  Dysphagia, confirmed NG tube placement. EXAM: PORTABLE ABDOMEN - 1 VIEW COMPARISON:  Abdominal x-ray dated 10/02/2016. FINDINGS: Enteric tube is incompletely imaged at the upper portion of this exam, presumably in the stomach but of uncertain positioning. Bowel gas pattern appears stable. IMPRESSION: Enteric tube is not adequately imaged, only the tip visible at the upper portion of the provided images, tip likely in the stomach but positioning cannot be more definitively characterized. Consider repeat plain film centered at the lung bases. Electronically Signed   By: Bary Richard M.D.   On: 10/04/2016 21:55   Dg Abd Portable 1v  Result Date: 10/02/2016 CLINICAL DATA:  Nasogastric tube placement. Cerebral palsy. Hiatal hernia. EXAM: PORTABLE ABDOMEN - 1 VIEW COMPARISON:  09/27/2015. Portable chest dated 09/28/2016.  Abdomen pelvis CT  dated 08/26/2012. FINDINGS: Normal bowel gas pattern. Nasogastric tube tip in the proximal to mid stomach and side hole in the proximal stomach. Probable suprapubic bladder catheter. Mild to moderate dextroconvex thoracolumbar scoliosis. Mild T12 and T9 vertebral compression deformities with no visible acute fracture lines, not present on 08/26/2012 and unchanged compared to 09/28/2016. There is also a mild L4 vertebral compression deformity without significant change since 08/26/2012. Diffuse osteopenia. IMPRESSION: 1. No acute abnormality. 2. Mild, old L4 vertebral compression deformity and probable old mild T9 and T12 vertebral compression deformities, new since 08/26/2012. Electronically Signed   By: Beckie Salts M.D.   On: 10/02/2016 19:46    Taylon Coole, DO  [facility] Pager [phone]  If 7PM-7AM, please contact night-coverage [email or web address] Password [email or web address] 10/05/2016, 5:00 PM   LOS: 6 days

## 2016-10-06 ENCOUNTER — Inpatient Hospital Stay (HOSPITAL_COMMUNITY): Payer: Medicare Other

## 2016-10-06 DIAGNOSIS — R633 Feeding difficulties, unspecified: Secondary | ICD-10-CM

## 2016-10-06 LAB — COMPREHENSIVE METABOLIC PANEL
ALBUMIN: 2.9 g/dL — AB (ref 3.5–5.0)
ALK PHOS: 39 U/L (ref 38–126)
ALT: 14 U/L (ref 14–54)
ANION GAP: 8 (ref 5–15)
AST: 13 U/L — ABNORMAL LOW (ref 15–41)
BILIRUBIN TOTAL: 0.6 mg/dL (ref 0.3–1.2)
BUN: 12 mg/dL (ref 6–20)
CALCIUM: 8.7 mg/dL — AB (ref 8.9–10.3)
CHLORIDE: 103 mmol/L (ref 101–111)
CO2: 30 mmol/L (ref 22–32)
Creatinine, Ser: <0.3 mg/dL — ABNORMAL LOW (ref 0.44–1.00)
GLUCOSE: 153 mg/dL — AB (ref 65–99)
Potassium: 3.5 mmol/L (ref 3.5–5.1)
Sodium: 141 mmol/L (ref 135–145)
Total Protein: 4.9 g/dL — ABNORMAL LOW (ref 6.5–8.1)

## 2016-10-06 LAB — T4, FREE: Free T4: 1.02 ng/dL (ref 0.61–1.12)

## 2016-10-06 LAB — CBC
HEMATOCRIT: 37.8 % (ref 36.0–46.0)
HEMOGLOBIN: 12.7 g/dL (ref 12.0–15.0)
MCH: 30.5 pg (ref 26.0–34.0)
MCHC: 33.6 g/dL (ref 30.0–36.0)
MCV: 90.6 fL (ref 78.0–100.0)
Platelets: 143 10*3/uL — ABNORMAL LOW (ref 150–400)
RBC: 4.17 MIL/uL (ref 3.87–5.11)
RDW: 13.3 % (ref 11.5–15.5)
WBC: 6.1 10*3/uL (ref 4.0–10.5)

## 2016-10-06 LAB — GLUCOSE, CAPILLARY
GLUCOSE-CAPILLARY: 106 mg/dL — AB (ref 65–99)
GLUCOSE-CAPILLARY: 125 mg/dL — AB (ref 65–99)
GLUCOSE-CAPILLARY: 99 mg/dL (ref 65–99)
Glucose-Capillary: 162 mg/dL — ABNORMAL HIGH (ref 65–99)

## 2016-10-06 LAB — VITAMIN B12: VITAMIN B 12: 381 pg/mL (ref 180–914)

## 2016-10-06 LAB — AMMONIA: AMMONIA: 26 umol/L (ref 9–35)

## 2016-10-06 LAB — TSH: TSH: 1.025 u[IU]/mL (ref 0.350–4.500)

## 2016-10-06 MED ORDER — FENTANYL CITRATE (PF) 100 MCG/2ML IJ SOLN
25.0000 ug | INTRAMUSCULAR | Status: DC | PRN
  Administered 2016-10-07: 50 ug via INTRAVENOUS
  Filled 2016-10-06: qty 2

## 2016-10-06 MED ORDER — LORAZEPAM 2 MG/ML IJ SOLN
0.5000 mg | INTRAMUSCULAR | Status: DC | PRN
  Administered 2016-10-06 – 2016-10-07 (×2): 1 mg via INTRAVENOUS
  Administered 2016-10-09: 0.5 mg via INTRAVENOUS
  Administered 2016-10-10: 1 mg via INTRAVENOUS
  Filled 2016-10-06 (×4): qty 1

## 2016-10-06 MED ORDER — HALOPERIDOL LACTATE 5 MG/ML IJ SOLN
1.0000 mg | INTRAMUSCULAR | Status: DC | PRN
  Administered 2016-10-07: 1 mg via INTRAVENOUS
  Filled 2016-10-06 (×2): qty 1

## 2016-10-06 NOTE — Progress Notes (Signed)
PROGRESS NOTE  Kristi Perry ZOX:[id number] DOB: 12/09/1965 DOA: 09/28/2016 PCP: Angela Coxasanayaka, Kristi Y, MD  Brief History:  51 year old female with cerebral palsy, seizure disorder, aspiration pneumonia, and functional quadriparesis presented to the emergency department after a 20 minute seizure at her skilled nursing facility. Apparently, the patient was given Ativan 2 mg, but she continued to seize. EMS gave Versed 2.5 mg with cessation of the seizure. In the emergency department, the patient was postictal and minimally responsive. She was placed in the stepdown unit and started on intravenous antiepileptic medications. Neurology was consulted to assist. In addition, palliative medicine was consulted as it was initially felt that the patient may not survive.  The patient was noted to have an ESBL Escherichia coli UTI.  She was not associated with meropenem for 3 days and subsequently received one dose of fosfomycin to complete therapy. During her hospital stay, the patient has had a waxing and waning episodes of alertness.   Assessment/Plan: Acute metabolic encephalopathy  -Multifactorial including infection, postictal from seizure state, UTI -Appreciate neurology follow-up  -Continue  AEDs per neurology -10/06/16--more alert, awake, and communicative -10/05/16--spoke with mother--she visited patient and stated pt is "75%" near baseline -ammonia--26 -check TSH--1.025 -B12--381  Sepsis  -presented with fever 102.3 with WBC 27.4, lactic acid 5.55>>>1.0 -secondary to UTI and aspiration -sepsis physiology resolved  Aspiration pneumonia/Acute respiratory failure with hypoxia -pt has retained oral secretions with difficulty clearing saliva -discontinue zosyn--finished 7 days abx -appreciate speech evaluation-->recommend NPO -continue enteral feeding through NG pending family decision on PEG -restart home brovana  UTI -ESBL EColi -finished 6 days of abx  Seizure  Disorder -continue AEDs per neurology -repeat EEG if no improvement in mental status -Continue Keppra 1 g twice a day, Depakote 500 mg 3 times a day, Lamictal 100 mg twice a day, Klonopin 0.5 mg daily -10/01/2016 EEG--Occasional epileptiform discharges over the left temporal region; background slowing with asymmetry, left greater than right   Underlying cerebral palsy, bed bound state, functional quadriplegia -essentially bed bound status -palliative medicine following  Goals of Care -10/05/16--discussed with pt's mother--she has not seen the patient for > 3 months -10/05/16--mother visited patient--stated pt is "75%" near baseline -mother wanted PEG, but I encouraged her to speak with Dr. Neale BurlyFreeman before moving forward -mother confirms DNR      Disposition Plan:  SNF 1-2 days pending family decision on PEG Family Communication:   No family at bedside   Consultants:  Palliative medicine, neurology  Code Status:  DNR  DVT Prophylaxis: [place] Lovenox   Procedures: As Listed in Progress Note Above  Antibiotics: Zosyn 8/1>>>8/5 merrem 7/29>>>8/1 Fosfomycin 8/1 x 1     Subjective: Patient is alert and awake and communicative. She answers simple questions. She states that she hurts all over. Denies any shortness breath, vomiting. Remainder review of systems difficult to obtain secondary to patient's mental status. No reported diarrhea, respiratory distress, uncontrolled pain. Patient had agitation overnight requiring Ativan  Objective: Vitals:   10/05/16 1500 10/05/16 2002 10/05/16 2142 10/06/16 0655  BP: 136/65  125/64 (!) 121/55  Pulse: (!) 55  80 80  Resp: 20  20 20   Temp: 98.5 F (36.9 C)  98.4 F (36.9 C) 98.3 F (36.8 C)  TempSrc: Oral  Oral Oral  SpO2: 97% (!) 83% 98% 99%  Weight:    69.5 kg (153 lb 3.5 oz)  Height:        Intake/Output Summary (Last 24  hours) at 10/06/16 0947 Last data filed at 10/06/16 0606  Gross per 24 hour  Intake           3680.83 ml  Output              650 ml  Net          3030.83 ml   Weight change:  Exam:   General:  Pt is alert, follows commands appropriately, not in acute distress  HEENT: No icterus, No thrush, No neck mass, [place]/AT  Cardiovascular: RRR, S1/S2, no rubs, no gallops  Respiratory: bibasilar rales, no wheeze  Abdomen: Soft/+BS, non tender, non distended, no guarding  Extremities: No edema, No lymphangitis, No petechiae, No rashes, no synovitis   Data Reviewed: I have personally reviewed following labs and imaging studies Basic Metabolic Panel:  Recent Labs Lab 10/01/16 0312 10/02/16 0306 10/02/16 1738 10/05/16 0558 10/06/16 0514  NA 143 141 143 143 141  K 3.0* 2.9* 3.5 3.9 3.5  CL 101 100* 99* 105 103  CO2 33* 33* 38* 27 30  GLUCOSE 117* 94 84 71 153*  BUN 6 6 6 11 12   CREATININE 0.34* 0.34* <0.30* 0.32* <0.30*  CALCIUM 8.8* 8.2* 8.6* 8.8* 8.7*  MG  --  1.8  --   --   --    Liver Function Tests:  Recent Labs Lab 10/02/16 0306 10/06/16 0514  AST 17 13*  ALT 19 14  ALKPHOS 54 39  BILITOT 0.6 0.6  PROT 5.2* 4.9*  ALBUMIN 3.0* 2.9*   No results for input(s): LIPASE, AMYLASE in the last 168 hours.  Recent Labs Lab 10/06/16 0514  AMMONIA 26   Coagulation Profile: No results for input(s): INR, PROTIME in the last 168 hours. CBC:  Recent Labs Lab 09/30/16 0355 10/01/16 0312 10/02/16 0306 10/06/16 0514  WBC 16.1* 9.8 7.3 6.1  HGB 15.6* 14.2 12.7 12.7  HCT 44.8 41.0 37.8 37.8  MCV 89.8 88.6 91.5 90.6  PLT 200 183 162 143*   Cardiac Enzymes: No results for input(s): CKTOTAL, CKMB, CKMBINDEX, TROPONINI in the last 168 hours. BNP: Invalid input(s): POCBNP CBG:  Recent Labs Lab 09/29/16 1227 09/29/16 1638 10/05/16 1653 10/05/16 2150 10/06/16 0046  GLUCAP 134* 112* 86 109* 125*   HbA1C: No results for input(s): HGBA1C in the last 72 hours. Urine analysis:    Component Value Date/Time   COLORURINE YELLOW 09/29/2016 0054   APPEARANCEUR  CLOUDY (A) 09/29/2016 0054   APPEARANCEUR Hazy 03/18/2012 1233   LABSPEC 1.012 09/29/2016 0054   LABSPEC 1.006 03/18/2012 1233   PHURINE 5.0 09/29/2016 0054   GLUCOSEU 50 (A) 09/29/2016 0054   GLUCOSEU Negative 03/18/2012 1233   HGBUR SMALL (A) 09/29/2016 0054   BILIRUBINUR NEGATIVE 09/29/2016 0054   BILIRUBINUR Negative 03/18/2012 1233   KETONESUR NEGATIVE 09/29/2016 0054   PROTEINUR 100 (A) 09/29/2016 0054   UROBILINOGEN 0.2 11/19/2013 2230   NITRITE NEGATIVE 09/29/2016 0054   LEUKOCYTESUR LARGE (A) 09/29/2016 0054   LEUKOCYTESUR 3+ 03/18/2012 1233   Sepsis Labs: @LABRCNTIP (procalcitonin:4,lacticidven:4) ) Recent Results (from the past 240 hour(s))  Culture, blood (Routine x 2)     Status: None   Collection Time: 09/29/16 12:27 AM  Result Value Ref Range Status   Specimen Description BLOOD LEFT HAND  Final   Special Requests   Final    BOTTLES DRAWN AEROBIC AND ANAEROBIC Blood Culture adequate volume   Culture   Final    NO GROWTH 5 DAYS Performed at [facility], [address] [place], [place] [address]    Report Status 10/04/2016 FINAL  Final  Culture, blood (Routine x 2)     Status: None   Collection Time: 09/29/16 12:27 AM  Result Value Ref Range Status   Specimen Description BLOOD RIGHT ANTECUBITAL  Final   Special Requests   Final    BOTTLES DRAWN AEROBIC AND ANAEROBIC Blood Culture adequate volume   Culture   Final    NO GROWTH 5 DAYS Performed at [facility], [address] [place], [place] [address]    Report Status 10/04/2016 FINAL  Final  Urine culture     Status: Abnormal   Collection Time: 09/29/16 12:53 AM  Result Value Ref Range Status   Specimen Description URINE, CATHETERIZED  Final   Special Requests NONE  Final   Culture (A)  Final    >=100,000 COLONIES/mL ESCHERICHIA COLI Confirmed Extended Spectrum Beta-Lactamase Producer (ESBL) Performed at [facility], [address], [place], [place] [address]    Report Status  10/01/2016 FINAL  Final   Organism ID, Bacteria ESCHERICHIA COLI (A)  Final      Susceptibility   Escherichia coli - MIC*    AMPICILLIN >=32 RESISTANT Resistant     CEFAZOLIN >=64 RESISTANT Resistant     CEFTRIAXONE >=64 RESISTANT Resistant     CIPROFLOXACIN >=4 RESISTANT Resistant     GENTAMICIN <=1 SENSITIVE Sensitive     IMIPENEM <=0.25 SENSITIVE Sensitive     NITROFURANTOIN <=16 SENSITIVE Sensitive     TRIMETH/SULFA <=20 SENSITIVE Sensitive     AMPICILLIN/SULBACTAM 8 SENSITIVE Sensitive     PIP/TAZO <=4 SENSITIVE Sensitive     Extended ESBL POSITIVE Resistant     * >=100,000 COLONIES/mL ESCHERICHIA COLI  MRSA PCR Screening     Status: None   Collection Time: 09/29/16  5:23 AM  Result Value Ref Range Status   MRSA by PCR NEGATIVE NEGATIVE Final    Comment:        The GeneXpert MRSA Assay (FDA approved for NASAL specimens only), is one component of a comprehensive MRSA colonization surveillance program. It is not intended to diagnose MRSA infection nor to guide or monitor treatment for MRSA infections.   Culture, blood (routine x 2)     Status: None (Preliminary result)   Collection Time: 10/03/16  8:06 AM  Result Value Ref Range Status   Specimen Description BLOOD LEFT ARM  Final   Special Requests IN PEDIATRIC BOTTLE Blood Culture adequate volume  Final   Culture   Final    NO GROWTH 2 DAYS Performed at [facility], [address], [place], [place] [address]    Report Status PENDING  Incomplete  Culture, blood (routine x 2)     Status: None (Preliminary result)   Collection Time: 10/03/16  8:06 AM  Result Value Ref Range Status   Specimen Description BLOOD LEFT ARM  Final   Special Requests IN PEDIATRIC BOTTLE Blood Culture adequate volume  Final   Culture   Final    NO GROWTH 2 DAYS Performed at [facility], [address], [place], [place] [address]    Report Status PENDING  Incomplete     Scheduled Meds: . arformoterol  15 mcg Nebulization  BID  . chlorhexidine  15 mL Mouth Rinse BID  . clonazePAM  0.5 mg Oral QPC breakfast  . enoxaparin (LOVENOX) injection  40 mg Subcutaneous Q24H  . feeding supplement (PRO-STAT SUGAR FREE 64)  30 mL Per Tube BID  . fentaNYL  25 mcg Transdermal Q72H  . lamoTRIgine  100 mg Oral BID  . mouth rinse  15 mL Mouth Rinse q12n4p   Continuous Infusions: . 0.9 % NaCl with KCl 20 mEq / L 75 mL/hr at 10/05/16 2301  . feeding supplement (OSMOLITE 1.2 CAL) 1,000 mL (10/06/16 0827)  . levETIRAcetam 1,000 mg (10/06/16 0606)  . valproate sodium Stopped (10/06/16 0706)    Procedures/Studies: Ct Head Wo Contrast  Result Date: 09/29/2016 CLINICAL DATA:  Seizures tonight.  Postictal at this time. EXAM: CT HEAD WITHOUT CONTRAST TECHNIQUE: Contiguous axial images were obtained from the base of the skull through the vertex without intravenous contrast. COMPARISON:  08/26/2012 FINDINGS: Brain: There is no intracranial hemorrhage, mass or evidence of acute infarction. There is mild generalized atrophy. There is moderate chronic microvascular ischemic change. There is no significant extra-axial fluid collection. No acute intracranial findings are evident. Vascular: No hyperdense vessel or unexpected calcification. Skull: Normal. Negative for fracture or focal lesion. Sinuses/Orbits: No acute finding. Other: None. IMPRESSION: No acute intracranial findings. There is mild generalized atrophy and moderate chronic white matter hypodensity which likely represents small vessel ischemic disease. Electronically Signed   By: Ellery Plunk M.D.   On: 09/29/2016 04:01   Dg Chest Port 1 View  Result Date: 10/03/2016 CLINICAL DATA:  Altered mental status, recurrent fevers. EXAM: PORTABLE CHEST 1 VIEW COMPARISON:  Chest x-ray dated September 28, 2016. FINDINGS: Interval placement of an enteric tube with the tip and distal side port in the gastric body. The cardiomediastinal silhouette is normal. Patchy densities at the left lung base  behind the heart appear slightly improved. No pleural effusion or pneumothorax. No acute osseous abnormality. IMPRESSION: Improved patchy density in the left lung behind the heart, likely related to resolving pneumonia and/or aspiration. No new consolidation. Electronically Signed   By: Obie Dredge M.D.   On: 10/03/2016 08:30   Dg Chest Port 1 View  Result Date: 09/29/2016 CLINICAL DATA:  Seizure activity.  Fever. EXAM: PORTABLE CHEST 1 VIEW COMPARISON:  11/19/2013 FINDINGS: Shallow inspiration. Elevation of the left hemidiaphragm. Consolidation or atelectasis in the left lung base behind the heart. This could indicate pneumonia. Blunting of the left costophrenic angle suggests a small pleural effusion. No pneumothorax. Normal heart size and pulmonary vascularity. IMPRESSION: Shallow inspiration with elevation of the left hemidiaphragm. Consolidation and effusion in the left base may represent pneumonia. Electronically Signed   By: Burman Nieves M.D.   On: 09/29/2016 01:14   Dg Abd Portable 1v  Result Date: 10/06/2016 CLINICAL DATA:  Nasogastric tube placement. EXAM: PORTABLE ABDOMEN - 1 VIEW COMPARISON:  10/04/2016 FINDINGS: Nasogastric tube is seen with tip overlying the body of the stomach. No evidence of dilated bowel loops. IMPRESSION: Nasogastric tube tip overlies body of stomach. Electronically Signed   By: Myles Rosenthal M.D.   On: 10/06/2016 09:20   Dg Abd Portable 1v  Result Date: 10/04/2016 CLINICAL DATA:  Dysphagia, confirmed NG tube placement. EXAM: PORTABLE ABDOMEN - 1 VIEW COMPARISON:  Abdominal x-ray dated 10/02/2016. FINDINGS: Enteric tube is incompletely imaged at the upper portion of this exam, presumably in the stomach but of uncertain positioning. Bowel gas pattern appears stable. IMPRESSION: Enteric tube is not adequately imaged, only the tip visible at the upper portion of the provided images, tip likely in the stomach but positioning cannot be more definitively characterized.  Consider repeat plain film centered at the lung bases. Electronically Signed  By: Bary RichardStan  Maynard M.D.   On: 10/04/2016 21:55   Dg Abd Portable 1v  Result Date: 10/02/2016 CLINICAL DATA:  Nasogastric tube placement. Cerebral palsy. Hiatal hernia. EXAM: PORTABLE ABDOMEN - 1 VIEW COMPARISON:  09/27/2015. Portable chest dated 09/28/2016. Abdomen pelvis CT dated 08/26/2012. FINDINGS: Normal bowel gas pattern. Nasogastric tube tip in the proximal to mid stomach and side hole in the proximal stomach. Probable suprapubic bladder catheter. Mild to moderate dextroconvex thoracolumbar scoliosis. Mild T12 and T9 vertebral compression deformities with no visible acute fracture lines, not present on 08/26/2012 and unchanged compared to 09/28/2016. There is also a mild L4 vertebral compression deformity without significant change since 08/26/2012. Diffuse osteopenia. IMPRESSION: 1. No acute abnormality. 2. Mild, old L4 vertebral compression deformity and probable old mild T9 and T12 vertebral compression deformities, new since 08/26/2012. Electronically Signed   By: Beckie SaltsSteven  Reid M.D.   On: 10/02/2016 19:46    Lorriane Dehart, DO  [facility] Pager [phone]  If 7PM-7AM, please contact night-coverage [email or web address] Password [email or web address] 10/06/2016, 9:47 AM   LOS: 7 days

## 2016-10-06 NOTE — Progress Notes (Signed)
Nutrition Brief Follow-up  RD consulted for TF management over the weekend. Weekend RD placed orders based on recommendations from full follow-up 8/3.  Pt now receiving Osmolite 1.2 @ 55 ml/hr w/ 30 ml Prostat BID via NGT.  Palliative care now following for GOC. Will monitor for decisions on PEG placement.  Tilda FrancoLindsey Cynthis Purington, MS, RD, LDN Pager: [phone] After Hours Pager: [phone]

## 2016-10-06 NOTE — Progress Notes (Signed)
According to home medication list- PT takes Pulmicort at home please advise.

## 2016-10-06 NOTE — Plan of Care (Signed)
Problem: Nutrition: Goal: Adequate nutrition will be maintained Outcome: Not Progressing NG tube was pulled today. Speech therapy to evaluate tomorrow if she is still aspirating, if so will be made comfort care.

## 2016-10-06 NOTE — Consult Note (Signed)
S: Patient asleep, easily arousable, said she was hurting.  Last night patient apparently alert but agitated, received Ativan and Fentanyl patch.   ROS: unable to obtain due to mental status  Examination  Vital signs in last 24 hours: Temp:  [98.3 F (36.8 C)] 98.3 F (36.8 C) (08/05 2010) Pulse Rate:  [60-80] 60 (08/05 2010) Resp:  [19-20] 20 (08/05 2010) BP: (102-132)/(55-59) 102/55 (08/05 2010) SpO2:  [93 %-100 %] 100 % (08/05 2010) Weight:  [69.5 kg (153 lb 3.5 oz)] 69.5 kg (153 lb 3.5 oz) (08/05 0655)  General: Not in distress,  CVS: pulse-normal rate and rhythm RS: breathing comfortably Extremities: normal   Neuro: MS: easily arousable, does not follow commands CN: pupils equal and reactive,  Motor: moves left arm 2/5  Coordination: normal Gait: not tested  Basic Metabolic Panel:  Recent Labs Lab 10/01/16 0312 10/02/16 0306 10/02/16 1738 10/05/16 0558 10/06/16 0514  NA 143 141 143 143 141  K 3.0* 2.9* 3.5 3.9 3.5  CL 101 100* 99* 105 103  CO2 33* 33* 38* 27 30  GLUCOSE 117* 94 84 71 153*  BUN 6 6 6 11 12   CREATININE 0.34* 0.34* <0.30* 0.32* <0.30*  CALCIUM 8.8* 8.2* 8.6* 8.8* 8.7*  MG  --  1.8  --   --   --     CBC:  Recent Labs Lab 09/30/16 0355 10/01/16 0312 10/02/16 0306 10/06/16 0514  WBC 16.1* 9.8 7.3 6.1  HGB 15.6* 14.2 12.7 12.7  HCT 44.8 41.0 37.8 37.8  MCV 89.8 88.6 91.5 90.6  PLT 200 183 162 143*     Coagulation Studies: No results for input(s): LABPROT, INR in the last 72 hours.  Imaging Reviewed:    ASSESSMENT AND PLAN  Cerebral Palsy  Refractory Epilepsy Seizures  Patient being made COMFORT CARE as patient not alert to pass swallow. Patient did receive Ativan and additional Fentanyl.   Recommendations  Reduce Keppra to 500 mg BID, this was previous home dose.  Depakone 500 TID ( subtherapeutic dose at VPA level of 18) Lamtorigine 100 mg BID Clonazepam 0.5 mg daily   Sushanth Aroor MD [facility] [phone]  If 7pm to 7am, please call on call as listed on AMION.

## 2016-10-06 NOTE — Progress Notes (Signed)
Daily Progress Note   Patient Name: Kristi Perry       Date: 10/03/2016 DOB: 01/19/1966  Age: 51 y.o. MRN#: [id number] Attending Physician: Kristi Roys, MD Primary Care Physician: Kristi Laughter, MD Admit Date: 09/28/2016  Reason for Consultation/Follow-up: Establishing goals of care  Subjective: Lying in bed in no distress More verbal today, She answers some questions appropriately.  Reports pain and short of breath.  Wants tube out of her nose.  I met with several members of her family today, including her mother Kristi Perry.  We discussed clinical course as well as wishes moving forward in regard to care in light of her hospital course.  We discussed concerns regarding nutrition moving forward, includine PEG tube, aspiration PNA, and comfort feeding.    We discussed difference between a aggressive medical intervention path (placement of PEG tube) and a palliative, comfort focused care path (comfort feeds and understanding that aspiration will likely occur in short order).    See below:   Length of Stay: 4 Physical Exam               Eyes open, mumbles inchoerently. Now able to consistently track and interact S1 S2 Clear Abdomen soft Trace edema  Vital Signs: BP (!) 132/59 (BP Location: Left Arm)   Pulse 64   Temp 98.3 F (36.8 C) (Oral)   Resp 19   Ht '4\' 9"'  (1.448 m)   Wt 69.5 kg (153 lb 3.5 oz)   LMP  (LMP Unknown) Comment: pt unresponsive   SpO2 99%   BMI 33.16 kg/m  SpO2: SpO2: 99 % O2 Device: O2 Device: Nasal Cannula O2 Flow Rate: O2 Flow Rate (L/min): 4 L/min  Intake/output summary:   Intake/Output Summary (Last 24 hours) at 10/06/16 1331 Last data filed at 10/06/16 1132  Gross per 24 hour  Intake          3800.83 ml  Output             1100 ml  Net           2700.83 ml   LBM: Last BM Date:  (Unknown as pt is not responsive) Baseline Weight: Weight: 81.6 kg (180 lb) Most recent weight: Weight: 69.5 kg (153 lb 3.5 oz)       Palliative Assessment/Data:    Flowsheet Rows     Most Recent Value  Intake Tab  Referral Department  Hospitalist  Unit at Time of Referral  Intermediate Care Unit  Palliative Care Primary Diagnosis  Other (Comment) [seizure, sepsis, pna, CP]  Palliative Care Type  New Palliative care  Reason for referral  Clarify Goals of Care  Date first seen by Palliative Care  09/29/16  Clinical Assessment  Palliative Performance Scale Score  10%  Pain Max last 24 hours  2  Pain Min Last 24 hours  1  Psychosocial & Spiritual Assessment  Palliative Care Outcomes  Patient/Family meeting held?  No  Palliative Care Outcomes  Other (Comment) [call placed, unable to reach mother ]      Patient Active Problem List   Diagnosis Date Noted  . Feeding difficulty   . Aspiration pneumonia of left lower lobe ([place]) 10/05/2016  .  Dysphagia   . Pressure injury of skin 10/02/2016  . Post-ictal coma ([place]) 09/29/2016  . Sepsis ([place]) 11/20/2013  . Unspecified hypothyroidism 11/20/2013  . Seizures ([place]) 11/20/2013  . Aspiration pneumonia ([place]) 11/20/2013  . UTI (urinary tract infection) 11/20/2013  . Acute respiratory failure with hypoxia ([place]) 11/19/2013  . Infection due to ESBL-producing Escherichia coli 04/07/2012  . Thyroid nodule 04/07/2012  . Nephrolithiasis 04/05/2012  . Acute lower UTI 04/03/2012  . HCAP (healthcare-associated pneumonia) 02/27/2012  . Cerebral palsy ([place]) 02/27/2012    Palliative Care Assessment & Plan   Patient Profile:  51 year old female with history of cerebral palsy, seizures, aspiration pneumonia who came in after prolonged diminished lung seizure at the SNF Assessment: Post ictal coma and seizures HCAP causing acute respiratory failure with hypoxia Underlying cerebral palsy, bed bound state,  functional quadriplegia, and seizure disorder Recommendations/Plan:  Her family, including mother Kristi Perry, report that they want to ensure comfort and dignity moving forward with understanding that we are likely at end of life.    They are not interested in pursuing PEG tube placement.  Family would like to pull NGT today. Request speech to see tomorrow to help determine most appropriate diet to restart with understanding that this will be for comfort and she will aspirate at some point in the near future.  Will liberalize to chips and sips for comfort until seen by speech.  Family understands that she may aspirate on any intake, pills, or her secretions.  Will leave antiepileptics as IV tonight.  Family is interested in placement at [facility] for end of life care. If she continues to aspirate, I think that her prognosis is less than 2 weeks as she will likely qualify for residential hospice.  If she is able to maintain some nutrition and hydration (without immediate aspiration) and it appears that she has a longer prognosis, family would like to pursue placement at long term care facility with hospice support.  I will place hospice referral today.  As overall goals is comfort, will liberalize pain medication and added on ativan and haldol for anxiety/agitation.  Pain: Reports pain despite recent fentanyl. Increase to 25-59mg Q1 hour as needed.          Code Status:    Code Status Orders        Start     Ordered   09/29/16 0258  Do not attempt resuscitation (DNR)  Continuous    Question Answer Comment  In the event of cardiac or respiratory ARREST Do not call a "code blue"   In the event of cardiac or respiratory ARREST Do not perform Intubation, CPR, defibrillation or ACLS   In the event of cardiac or respiratory ARREST Use medication by any route, position, wound care, and other measures to relive pain and suffering. May use oxygen, suction and manual treatment of airway obstruction  as needed for comfort.      09/29/16 0305    Code Status History    Date Active Date Inactive Code Status Order ID Comments User Context   11/20/2013  1:13 AM 11/23/2013  4:06 PM DNR [id number] DPhillips Grout MD ED   04/03/2012  9:06 AM 04/07/2012  4:13 PM Full Code [id number] Kristi Ivanoff RN Inpatient   02/28/2012  2:00 AM 03/02/2012  7:25 PM DNR [id number] Kristi Dun NP Inpatient   02/27/2012  6:14 PM 02/28/2012  2:00 AM Full Code [id number] WSigurd Sos RN Inpatient  Advance Directive Documentation     Most Recent Value  Type of Advance Directive  Out of facility DNR (pink MOST or yellow form)  Pre-existing out of facility DNR order (yellow form or pink MOST form)  Yellow form placed in chart (order not valid for inpatient use)  "MOST" Form in Place?  -       Prognosis:   < 2 weeks most likely.  She has NGT that is going to be pulled.  Goals is comfort moving forward.  Will plan to restart diet following speech recs (I will discuss with SLP tomorrow) but I believe she will continue to aspirate and likely prognosis from either aspiration PNA or complications of seizures (if she cannot get in her antiepilleptics once converted to orals) is likely less than 2 weeks.  Plan to reassess tomorrow.  Family does want hospice regardless of dispo, so will place this referral today.  Discharge Planning:  To be determined. Hopeful for [employer].  Care plan was discussed with bedside RN and Dr. Carles Collet  Thank you for allowing the Palliative Medicine Team to assist in the care of this patient.  Total time: 40 minutes    Greater than 50%  of this time was spent counseling and coordinating care related to the above assessment and plan.  Micheline Rough, MD  Please contact Palliative Medicine Team phone at [phone] for questions and concerns.   Micheline Rough, MD [place] Team [phone]

## 2016-10-07 ENCOUNTER — Inpatient Hospital Stay (HOSPITAL_COMMUNITY): Payer: Medicare Other

## 2016-10-07 LAB — GLUCOSE, CAPILLARY
GLUCOSE-CAPILLARY: 70 mg/dL (ref 65–99)
GLUCOSE-CAPILLARY: 76 mg/dL (ref 65–99)
GLUCOSE-CAPILLARY: 75 mg/dL (ref 65–99)
Glucose-Capillary: 138 mg/dL — ABNORMAL HIGH (ref 65–99)
Glucose-Capillary: 135 mg/dL — ABNORMAL HIGH (ref 65–99)
Glucose-Capillary: 73 mg/dL (ref 65–99)

## 2016-10-07 LAB — BASIC METABOLIC PANEL
Anion gap: 7 (ref 5–15)
BUN: 8 mg/dL (ref 6–20)
CHLORIDE: 109 mmol/L (ref 101–111)
CO2: 27 mmol/L (ref 22–32)
Calcium: 8.3 mg/dL — ABNORMAL LOW (ref 8.9–10.3)
Creatinine, Ser: <0.3 mg/dL — ABNORMAL LOW (ref 0.44–1.00)
GLUCOSE: 89 mg/dL (ref 65–99)
POTASSIUM: 3.7 mmol/L (ref 3.5–5.1)
Sodium: 143 mmol/L (ref 135–145)

## 2016-10-07 LAB — MAGNESIUM: Magnesium: 2.1 mg/dL (ref 1.7–2.4)

## 2016-10-07 MED ORDER — SODIUM CHLORIDE 0.9 % IV SOLN
500.0000 mg | Freq: Two times a day (BID) | INTRAVENOUS | Status: DC
  Administered 2016-10-07 – 2016-10-10 (×7): 500 mg via INTRAVENOUS
  Filled 2016-10-07 (×8): qty 5

## 2016-10-07 NOTE — Progress Notes (Signed)
Palliative Care Progress note  Reason for consult: Goals of care  Discussed case with Dr. Arbutus Leasat this AM.  Chart reviewed.  I saw and examined Ms. Balderston.  Spoke with sister in law Teacher, English as a foreign language(heather) at bedside and also with mother via phone.  Family would like to pursue EEG as recommended by neurology.  Working to transfer to [facility] for this.  She has been more comfortable without NGT, but concern about her getting lamicatal as still NPO (other antiepileptics are currently IV).   I have also discussed her case with SLP and asked them to reassess to help determine feasibility of getting in medication by mouth rather than replacing NGT.  I would recommend focusing on medications that may effect EEG but not push other medications at this time if it will spare us from needing another NGT.  I will also ask PMT to [facility] in order to help family with decision making once results of EEG are available.  Total time: 25 minutes Greater than 50%  of this time was spent counseling and coordinating care related to the above assessment and plan.  Romie MinusGene Adrean Findlay, MD [facility] Palliative Medicine Team [phone]

## 2016-10-07 NOTE — Progress Notes (Signed)
PROGRESS NOTE  Kristi Perry JXB:[id number] DOB: 09/09/1965 DOA: 09/28/2016 PCP: Angela Cox, MD  Brief History: 51 year old female with cerebral palsy, seizure disorder, aspiration pneumonia, and functional quadriparesis presented to the emergency department after a 20 minute seizure at her skilled nursing facility. Apparently, the patient was given Ativan 2 mg, but she continued to seize. EMS gave Versed 2.5 mg with cessation of the seizure. In the emergency department, the patient was postictal and minimally responsive. She was placed in the stepdown unit and started on intravenous antiepileptic medications. Neurology was consulted to assist. In addition, palliative medicine was consulted as it was initially felt that the patient may not survive. The patient was noted to have an ESBL Escherichia coli UTI. She was not associated with meropenem for 3 days and subsequently received one dose of fosfomycin to complete therapy. During her hospital stay, the patient has had a waxing and waning episodes of alertness.   Assessment/Plan: Acute metabolic encephalopathy  -Multifactorial including infection, postictal from seizure state, UTI -Appreciate neurology follow-up  -Continue AEDs per neurology -10/06/16--more alert, awake, and communicative -10/07/16--less responsive, staring into space -10/07/16--discussed with neurology--concerned about recurrent seizure-->transfer to [facility] for continuous EEG -leave NG tube removed 10/06/16; discussed with neurology--leave out for now pending EEG and they will make decisions about adding/switching AEDs -ammonia--26 -check TSH--1.025 -B12--381  Sepsis  -presented with fever 102.3 with WBC 27.4, lactic acid 5.55>>>1.0 -secondary to UTI and aspiration -sepsis physiology resolved  Aspiration pneumonia/Acute respiratory failure with hypoxia -pt has retained oral secretions with difficulty clearing saliva -discontinue zosyn--finished 7  days abx 10/06/16 -appreciate speech evaluation-->recommend NPO -continue enteral feeding through NG pending family decision on PEG -restart home [place] -Presently stable on 2 L nasal cannula  UTI -ESBL EColi -finished 6 days of abx  Seizure Disorder -continue AEDs per neurology -repeat EEG as discussed above -Continue Keppra 1 g twice a day, Depakote 500 mg 3 times a day, Lamictal 100 mg twice a day, Klonopin 0.5 mg daily -10/01/2016 EEG--Occasional epileptiform discharges over the left temporal region;background slowing with asymmetry, left greater than right   Underlying cerebral palsy, bed bound state, functional quadriplegia -essentially bed bound status -palliative medicine following  Goals of Care -10/05/16--discussed with pt's mother--she has not seen the patient for > 3 months -10/05/16--mother visited patient -mother does not want PEG--but wants transfer to [facility] for EEG monitoring -mother confirms DNR -palliative medicine following      Disposition Plan: SNF with hospice vs residential hospice Family Communication: family updated at bedside--Total time spent 40 minutes.  Greater than 50% spent face to face counseling and coordinating care.    Consultants: Palliative medicine, neurology  Code Status: DNR  DVT Prophylaxis: [place] Lovenox   Procedures: As Listed in Progress Note Above  Antibiotics: Zosyn 8/1>>>8/5 merrem 7/29>>>8/1 Fosfomycin 8/1 x 1    Subjective: Patient is awake, but does not answer any questions. She stares off into space. She is not interactive. No reports of respiratory distress, vomiting, diarrhea.  Objective: Vitals:   10/06/16 1315 10/06/16 2010 10/07/16 0511 10/07/16 0747  BP: (!) 132/59 (!) 102/55 (!) 114/50   Pulse: 64 60 62 85  Resp: 19 20 18 20   Temp: 98.3 F (36.8 C) 98.3 F (36.8 C) 97.8 F (36.6 C)   TempSrc: Oral Oral Axillary   SpO2: 99% 100% 100% 100%  Weight:   69.4 kg (153 lb 1.6 oz)     Height:  Intake/Output Summary (Last 24 hours) at 10/07/16 1039 Last data filed at 10/07/16 0512  Gross per 24 hour  Intake          2291.92 ml  Output             1150 ml  Net          1141.92 ml   Weight change: -0.054 kg (-1.9 oz) Exam:   General:  Pt is alert,  not in acute distress, does not follow commands  HEENT: No icterus, No thrush, No neck mass, [place]/AT  Cardiovascular: RRR, S1/S2, no rubs, no gallops  Respiratory: Bibasilar rales. No wheezing. Good air movement.  Abdomen: Soft/+BS, non tender, non distended, no guarding  Extremities: No edema, No lymphangitis, No petechiae, No rashes, no synovitis   Data Reviewed: I have personally reviewed following labs and imaging studies Basic Metabolic Panel:  Recent Labs Lab 10/02/16 0306 10/02/16 1738 10/05/16 0558 10/06/16 0514 10/07/16 0553  NA 141 143 143 141 143  K 2.9* 3.5 3.9 3.5 3.7  CL 100* 99* 105 103 109  CO2 33* 38* 27 30 27   GLUCOSE 94 84 71 153* 89  BUN 6 6 11 12 8   CREATININE 0.34* <0.30* 0.32* <0.30* <0.30*  CALCIUM 8.2* 8.6* 8.8* 8.7* 8.3*  MG 1.8  --   --   --  2.1   Liver Function Tests:  Recent Labs Lab 10/02/16 0306 10/06/16 0514  AST 17 13*  ALT 19 14  ALKPHOS 54 39  BILITOT 0.6 0.6  PROT 5.2* 4.9*  ALBUMIN 3.0* 2.9*   No results for input(s): LIPASE, AMYLASE in the last 168 hours.  Recent Labs Lab 10/06/16 0514  AMMONIA 26   Coagulation Profile: No results for input(s): INR, PROTIME in the last 168 hours. CBC:  Recent Labs Lab 10/01/16 0312 10/02/16 0306 10/06/16 0514  WBC 9.8 7.3 6.1  HGB 14.2 12.7 12.7  HCT 41.0 37.8 37.8  MCV 88.6 91.5 90.6  PLT 183 162 143*   Cardiac Enzymes: No results for input(s): CKTOTAL, CKMB, CKMBINDEX, TROPONINI in the last 168 hours. BNP: Invalid input(s): POCBNP CBG:  Recent Labs Lab 10/06/16 1725 10/06/16 2008 10/06/16 2334 10/07/16 0305 10/07/16 0756  GLUCAP 162* 99 106* 70 76   HbA1C: No results for  input(s): HGBA1C in the last 72 hours. Urine analysis:    Component Value Date/Time   COLORURINE YELLOW 09/29/2016 0054   APPEARANCEUR CLOUDY (A) 09/29/2016 0054   APPEARANCEUR Hazy 03/18/2012 1233   LABSPEC 1.012 09/29/2016 0054   LABSPEC 1.006 03/18/2012 1233   PHURINE 5.0 09/29/2016 0054   GLUCOSEU 50 (A) 09/29/2016 0054   GLUCOSEU Negative 03/18/2012 1233   HGBUR SMALL (A) 09/29/2016 0054   BILIRUBINUR NEGATIVE 09/29/2016 0054   BILIRUBINUR Negative 03/18/2012 1233   KETONESUR NEGATIVE 09/29/2016 0054   PROTEINUR 100 (A) 09/29/2016 0054   UROBILINOGEN 0.2 11/19/2013 2230   NITRITE NEGATIVE 09/29/2016 0054   LEUKOCYTESUR LARGE (A) 09/29/2016 0054   LEUKOCYTESUR 3+ 03/18/2012 1233   Sepsis Labs: @LABRCNTIP (procalcitonin:4,lacticidven:4) ) Recent Results (from the past 240 hour(s))  Culture, blood (Routine x 2)     Status: None   Collection Time: 09/29/16 12:27 AM  Result Value Ref Range Status   Specimen Description BLOOD LEFT HAND  Final   Special Requests   Final    BOTTLES DRAWN AEROBIC AND ANAEROBIC Blood Culture adequate volume   Culture   Final    NO GROWTH 5 DAYS Performed at [facility] Lab,  [address] [place], [place] [address]    Report Status 10/04/2016 FINAL  Final  Culture, blood (Routine x 2)     Status: None   Collection Time: 09/29/16 12:27 AM  Result Value Ref Range Status   Specimen Description BLOOD RIGHT ANTECUBITAL  Final   Special Requests   Final    BOTTLES DRAWN AEROBIC AND ANAEROBIC Blood Culture adequate volume   Culture   Final    NO GROWTH 5 DAYS Performed at [facility], [address], [place], [place] [address]    Report Status 10/04/2016 FINAL  Final  Urine culture     Status: Abnormal   Collection Time: 09/29/16 12:53 AM  Result Value Ref Range Status   Specimen Description URINE, CATHETERIZED  Final   Special Requests NONE  Final   Culture (A)  Final    >=100,000 COLONIES/mL ESCHERICHIA COLI Confirmed Extended  Spectrum Beta-Lactamase Producer (ESBL) Performed at [facility], [address], [place], [place] [address]    Report Status 10/01/2016 FINAL  Final   Organism ID, Bacteria ESCHERICHIA COLI (A)  Final      Susceptibility   Escherichia coli - MIC*    AMPICILLIN >=32 RESISTANT Resistant     CEFAZOLIN >=64 RESISTANT Resistant     CEFTRIAXONE >=64 RESISTANT Resistant     CIPROFLOXACIN >=4 RESISTANT Resistant     GENTAMICIN <=1 SENSITIVE Sensitive     IMIPENEM <=0.25 SENSITIVE Sensitive     NITROFURANTOIN <=16 SENSITIVE Sensitive     TRIMETH/SULFA <=20 SENSITIVE Sensitive     AMPICILLIN/SULBACTAM 8 SENSITIVE Sensitive     PIP/TAZO <=4 SENSITIVE Sensitive     Extended ESBL POSITIVE Resistant     * >=100,000 COLONIES/mL ESCHERICHIA COLI  MRSA PCR Screening     Status: None   Collection Time: 09/29/16  5:23 AM  Result Value Ref Range Status   MRSA by PCR NEGATIVE NEGATIVE Final    Comment:        The GeneXpert MRSA Assay (FDA approved for NASAL specimens only), is one component of a comprehensive MRSA colonization surveillance program. It is not intended to diagnose MRSA infection nor to guide or monitor treatment for MRSA infections.   Culture, blood (routine x 2)     Status: None (Preliminary result)   Collection Time: 10/03/16  8:06 AM  Result Value Ref Range Status   Specimen Description BLOOD LEFT ARM  Final   Special Requests IN PEDIATRIC BOTTLE Blood Culture adequate volume  Final   Culture   Final    NO GROWTH 4 DAYS Performed at [facility], [address] [place], [place] [address]    Report Status PENDING  Incomplete  Culture, blood (routine x 2)     Status: None (Preliminary result)   Collection Time: 10/03/16  8:06 AM  Result Value Ref Range Status   Specimen Description BLOOD LEFT ARM  Final   Special Requests IN PEDIATRIC BOTTLE Blood Culture adequate volume  Final   Culture   Final    NO GROWTH 4 DAYS Performed at [facility],  [address] [place], [place] [address]    Report Status PENDING  Incomplete     Scheduled Meds: . arformoterol  15 mcg Nebulization BID  . chlorhexidine  15 mL Mouth Rinse BID  . clonazePAM  0.5 mg Oral QPC breakfast  . enoxaparin (LOVENOX) injection  40 mg Subcutaneous Q24H  . fentaNYL  25 mcg Transdermal Q72H  .  lamoTRIgine  100 mg Oral BID  . mouth rinse  15 mL Mouth Rinse q12n4p   Continuous Infusions: . 0.9 % NaCl with KCl 20 mEq / L 75 mL/hr at 10/07/16 0305  . levETIRAcetam Stopped (10/07/16 0554)  . valproate sodium Stopped (10/07/16 4098)    Procedures/Studies: Ct Head Wo Contrast  Result Date: 09/29/2016 CLINICAL DATA:  Seizures tonight.  Postictal at this time. EXAM: CT HEAD WITHOUT CONTRAST TECHNIQUE: Contiguous axial images were obtained from the base of the skull through the vertex without intravenous contrast. COMPARISON:  08/26/2012 FINDINGS: Brain: There is no intracranial hemorrhage, mass or evidence of acute infarction. There is mild generalized atrophy. There is moderate chronic microvascular ischemic change. There is no significant extra-axial fluid collection. No acute intracranial findings are evident. Vascular: No hyperdense vessel or unexpected calcification. Skull: Normal. Negative for fracture or focal lesion. Sinuses/Orbits: No acute finding. Other: None. IMPRESSION: No acute intracranial findings. There is mild generalized atrophy and moderate chronic white matter hypodensity which likely represents small vessel ischemic disease. Electronically Signed   By: Ellery Plunk M.D.   On: 09/29/2016 04:01   Dg Chest Port 1 View  Result Date: 10/03/2016 CLINICAL DATA:  Altered mental status, recurrent fevers. EXAM: PORTABLE CHEST 1 VIEW COMPARISON:  Chest x-ray dated September 28, 2016. FINDINGS: Interval placement of an enteric tube with the tip and distal side port in the gastric body. The cardiomediastinal silhouette is normal. Patchy densities at the left lung base  behind the heart appear slightly improved. No pleural effusion or pneumothorax. No acute osseous abnormality. IMPRESSION: Improved patchy density in the left lung behind the heart, likely related to resolving pneumonia and/or aspiration. No new consolidation. Electronically Signed   By: Obie Dredge M.D.   On: 10/03/2016 08:30   Dg Chest Port 1 View  Result Date: 09/29/2016 CLINICAL DATA:  Seizure activity.  Fever. EXAM: PORTABLE CHEST 1 VIEW COMPARISON:  11/19/2013 FINDINGS: Shallow inspiration. Elevation of the left hemidiaphragm. Consolidation or atelectasis in the left lung base behind the heart. This could indicate pneumonia. Blunting of the left costophrenic angle suggests a small pleural effusion. No pneumothorax. Normal heart size and pulmonary vascularity. IMPRESSION: Shallow inspiration with elevation of the left hemidiaphragm. Consolidation and effusion in the left base may represent pneumonia. Electronically Signed   By: Burman Nieves M.D.   On: 09/29/2016 01:14   Dg Abd Portable 1v  Result Date: 10/06/2016 CLINICAL DATA:  Nasogastric tube placement. EXAM: PORTABLE ABDOMEN - 1 VIEW COMPARISON:  10/04/2016 FINDINGS: Nasogastric tube is seen with tip overlying the body of the stomach. No evidence of dilated bowel loops. IMPRESSION: Nasogastric tube tip overlies body of stomach. Electronically Signed   By: Myles Rosenthal M.D.   On: 10/06/2016 09:20   Dg Abd Portable 1v  Result Date: 10/04/2016 CLINICAL DATA:  Dysphagia, confirmed NG tube placement. EXAM: PORTABLE ABDOMEN - 1 VIEW COMPARISON:  Abdominal x-ray dated 10/02/2016. FINDINGS: Enteric tube is incompletely imaged at the upper portion of this exam, presumably in the stomach but of uncertain positioning. Bowel gas pattern appears stable. IMPRESSION: Enteric tube is not adequately imaged, only the tip visible at the upper portion of the provided images, tip likely in the stomach but positioning cannot be more definitively characterized.  Consider repeat plain film centered at the lung bases. Electronically Signed   By: Bary Richard M.D.   On: 10/04/2016 21:55   Dg Abd Portable 1v  Result Date: 10/02/2016 CLINICAL DATA:  Nasogastric tube placement. Cerebral  palsy. Hiatal hernia. EXAM: PORTABLE ABDOMEN - 1 VIEW COMPARISON:  09/27/2015. Portable chest dated 09/28/2016. Abdomen pelvis CT dated 08/26/2012. FINDINGS: Normal bowel gas pattern. Nasogastric tube tip in the proximal to mid stomach and side hole in the proximal stomach. Probable suprapubic bladder catheter. Mild to moderate dextroconvex thoracolumbar scoliosis. Mild T12 and T9 vertebral compression deformities with no visible acute fracture lines, not present on 08/26/2012 and unchanged compared to 09/28/2016. There is also a mild L4 vertebral compression deformity without significant change since 08/26/2012. Diffuse osteopenia. IMPRESSION: 1. No acute abnormality. 2. Mild, old L4 vertebral compression deformity and probable old mild T9 and T12 vertebral compression deformities, new since 08/26/2012. Electronically Signed   By: Beckie SaltsSteven  Reid M.D.   On: 10/02/2016 19:46    Davionte Lusby, DO  [facility] Pager [phone]  If 7PM-7AM, please contact night-coverage [email or web address] Password [email or web address] 10/07/2016, 10:39 AM   LOS: 8 days

## 2016-10-07 NOTE — Evaluation (Signed)
Clinical/Bedside Swallow Evaluation Patient Details  Name: Kristi Perry MRN: [id number] Date of Birth: 04/09/1965  Today's Date: 10/07/2016 Time: SLP Start Time (ACUTE ONLY): 1115 SLP Stop Time (ACUTE ONLY): 1150 SLP Time Calculation (min) (ACUTE ONLY): 35 min  Past Medical History:  Past Medical History:  Diagnosis Date  . Acute respiratory failure (HCC)   . Allergic rhinitis   . Anxiety   . Cerebral palsy (HCC)   . Chronic airway obstruction (HCC)   . Chronic pain syndrome   . Depression   . DVT (deep vein thrombosis) in pregnancy (HCC)   . Dysphagia   . Dysphasia   . Epilepsy (HCC)   . Hiatal hernia   . Hyperlipidemia   . Hypertension   . Hypothyroidism   . Incontinent of feces 09-25-11   hx.  . Incontinent of urine 09-25-11   hx.  . Migraine   . Mild intellectual disabilities   . Morbid obesity (HCC) 09-25-11   requires Hoyer lift. pt. doesn't stand or ambulate.  . Muscle weakness   . Nephrolithiasis 04/05/2012  . Neurogenic bladder   . Pneumonia    hx of asp pneumonia 1/11-1/19/12  . Sacral decubitus ulcer    hx of  . Seizures (HCC)   . Sepsis(995.91)   . Sepsis(995.91)    hx of   . Unspecified psychosis   . Vitamin D deficiency    Past Surgical History:  Past Surgical History:  Procedure Laterality Date  . CYSTOSCOPY WITH URETEROSCOPY  11/07/2011   Procedure: CYSTOSCOPY WITH URETEROSCOPY;  Surgeon: Marcine MatarStephen Dahlstedt, MD;  Location: [facility];  Service: Urology;  Laterality: Right;  Removal of right double J stent, Insertion right double J stent  . NEPHROLITHOTOMY  10/07/2011   Procedure: NEPHROLITHOTOMY PERCUTANEOUS;  Surgeon: Marcine MatarStephen Dahlstedt, MD;  Location: [facility];  Service: Urology;  Laterality: Right;  kidney   . NEPHROLITHOTOMY  12/06/2011   Procedure: NEPHROLITHOTOMY PERCUTANEOUS;  Surgeon: Marcine MatarStephen Dahlstedt, MD;  Location: [facility];  Service: Urology;  Laterality: Right;   REPEAT PCNL   . STONE EXTRACTION WITH BASKET  11/07/2011   Procedure: STONE EXTRACTION  WITH BASKET;  Surgeon: Marcine MatarStephen Dahlstedt, MD;  Location: [facility];  Service: Urology;  Laterality: Right;   HPI:  51 yo adm from [facility] with seizures.  PMH + for CP, asp pna, functional quadriparesis - bedbound.  Per review of soft chart, pt on puree/thin at SNF.  Pt has not had prior imaging swallow studies.  She was placed on a dys1/thin diet clinically = with intially good tolerance.  Pt became lethargic and NG tube placed for medication administration.  Initial SLP evaluation this admission 7/30 recommended Dys 1 diet and nectar thick liquids but pt become more lethargic and less responsive, therefore SLP signed off and pt was kept NPO. SLP was re-ordered due to improvements noted in mentation.  Pt seen Saturday and trials of po recommended.  Order was dc'd but reorder today to determine ability for pt to take Lamictal.     Assessment / Plan / Recommendation Clinical Impression  SLP was re-ordered to assess swallowing due to improvements noted in mentation to determine if she may have po Lamictal in hopes to present NG tube replacement.   Pt is alert and responding inconsistently to commands- 2 brothers and sisters in law present.  Her voice was initially clear but noted to become gurgly during session.  Pt also with anterior loss of secretions/drooling on right side - ? aspiration of secretions0- removed  with oral suction.  She does attempt to cough on command to clear but is not fully effective.  She stated "hurts bad" again to request to cough.    SLP presented pt with minimal amounts of water via swab with pt manipulation and swallow.  Immediate cough post-swallow noted concerning for aspiration.  Pt also complains of throat pain - which SLP suspects may be due to mouth breathing, replacement of NG tube x2.  Pt readily admitted to throat feeling better after minimal intake of few boluses of applesauce.    No overt indication of aspiration with 5 boluses of applesauce.  Of note, pt's  voice became clearer - (assuming clearing some secretions) as she was yelling out regarding arm discomfort, RN informed of arm discomfort and arrived to comfort pt.    Recommend NPO except needed medication with applesauce - crushed - followed with tsp of applesauce.  She will benefit from [facility] prior to full diet recommendation however due to increased dysphagia symptoms compared to when first admitted.    Family and pt educated to plan/precautions using teach back with family.  Swallow signs left with RN for transfer to [facility].  Text paged Dr Tat and spoke to Dr Neale Burly regarding plan.  Dr Neale Burly agreeable to SLP follow along for any improvements in swallowing function and readiness for full po diet.    Given medical/mental status waxing/waning, suspect pt's aspiration risk will remain chronic with acute exacerbations.   SLP Visit Diagnosis: Dysphagia, oropharyngeal phase (R13.12)    Aspiration Risk  Severe aspiration risk;Risk for inadequate nutrition/hydration    Diet Recommendation NPO;Alternative means - temporary (PO medications crushed with applesauce, followed by applesauce)   Medication Administration: Crushed with puree Compensations: Slow rate;Small sips/bites (observe pt's swallow, cough/throat clear if voice is gurgly)    Other  Recommendations Oral Care Recommendations: Oral care QID Other Recommendations: Have oral suction available   Follow up Recommendations Skilled Nursing facility      Frequency and Duration min 2x/week  2 weeks       Prognosis Prognosis for Safe Diet Advancement: Fair Barriers to Reach Goals: Cognitive deficits;Time post onset;Severity of deficits      Swallow Study   General Date of Onset: 09/30/16 HPI: 51 yo adm from [facility] with seizures.  PMH + for CP, asp pna, functional quadriparesis - bedbound.  Per review of soft chart, pt on puree/thin at SNF.  Pt has not had prior imaging swallow studies.  She was placed on a dys1/thin diet  clinically = with intially good tolerance.  Pt became lethargic and NG tube placed for medication administration.  Initial SLP evaluation this admission 7/30 recommended Dys 1 diet and nectar thick liquids but pt become more lethargic and less responsive, therefore SLP signed off and pt was kept NPO. SLP was re-ordered due to improvements noted in mentation.  Pt seen Saturday and trials of po recommended.  Order was dc'd but reorder today to determine ability for pt to take Lamictal.   Type of Study: Bedside Swallow Evaluation Previous Swallow Assessment: see HPI Diet Prior to this Study: NPO Temperature Spikes Noted: No Respiratory Status: Nasal cannula History of Recent Intubation: No Behavior/Cognition: Alert;Requires cueing;Other (Comment) (inconsistently follows commands) Oral Care Completed by SLP: Yes Oral Cavity - Dentition: Edentulous Vision: Impaired for self-feeding Self-Feeding Abilities: Total assist Patient Positioning: Partially reclined;Postural control interferes with function (pt appears kyphotic and has CP which impairs positioning) Baseline Vocal Quality: Low vocal intensity;Wet Volitional Cough: Cognitively unable to  elicit;Other (Comment);Weak ("hurts bad", pt attempted but was weak) Volitional Swallow: Unable to elicit    Oral/Motor/Sensory Function Overall Oral Motor/Sensory Function: Generalized oral weakness   Ice Chips Ice chips: Not tested   Thin Liquid Thin Liquid: Impaired Presentation:  (swab) Oral Phase Impairments: Poor awareness of bolus;Reduced labial seal;Reduced lingual movement/coordination Oral Phase Functional Implications: Prolonged oral transit Pharyngeal  Phase Impairments: Suspected delayed Swallow;Cough - Immediate Other Comments: excessive cough with face turning red    Nectar Thick Nectar Thick Liquid: Impaired Presentation: Spoon Oral Phase Impairments: Reduced labial seal;Reduced lingual movement/coordination Oral phase functional  implications: Prolonged oral transit;Other (comment) (lingual thrusting) Pharyngeal Phase Impairments: Suspected delayed Swallow;Cough - Immediate   Honey Thick Honey Thick Liquid: Not tested   Puree Puree: Impaired Presentation: Spoon Oral Phase Impairments: Reduced labial seal;Reduced lingual movement/coordination Oral Phase Functional Implications: Prolonged oral transit Pharyngeal Phase Impairments: Suspected delayed Swallow   Solid   GO   Solid: Not tested        Chales Abrahams 10/07/2016,12:33 PM Donavan Burnet, MS [facility] SLP [phone]

## 2016-10-07 NOTE — Progress Notes (Signed)
Pt [facility] [phone] by [facility]. [facility]. Brayton CavesJessie RN aware that [facility] and is on the way now.

## 2016-10-07 NOTE — Care Management Note (Signed)
Case Management Note  Patient Details  Name: Kristi Perry MRN: [id number] Date of Birth: 01/24/1966  Subjective/Objective:     Continues to be nonverba and not following commands -not her normal baseline according to the Mother/[facility]               Action/Plan: Date:  October 07, 2016 Chart reviewed for concurrent status and case management needs. Will continue to follow patient progress. Discharge Planning: following for needs Expected discharge date: [id number] Marcelle SmilingRhonda Perry, BSN, [place], [place]   [phone]  Expected Discharge Date:                  Expected Discharge Plan:  Home/Self Care  In-House Referral:     Discharge planning Services  CM Consult  Post Acute Care Choice:    Choice offered to:     DME Arranged:    DME Agency:     HH Arranged:    HH Agency:     Status of Service:  In process, will continue to follow  If discussed at Long Length of Stay Meetings, dates discussed:    Additional Comments:  Kristi Perry, Kristi Lynn, RN 10/07/2016, 10:24 AM

## 2016-10-07 NOTE — Progress Notes (Signed)
Report called to Waneta MartinsJessie RN at [facility]

## 2016-10-07 NOTE — Progress Notes (Signed)
vLTM EEG running. Tested event button. Notified neuro °

## 2016-10-07 NOTE — Progress Notes (Signed)
Subjective: No further seizures documented and no seizures noted today. In room not talking or following commands. Had a long conversation with the mother who states this is not her baseline. Usually she is very talkative.   Exam: Vitals:   10/07/16 0511 10/07/16 0747  BP: (!) 114/50   Pulse: 62 85  Resp: 18 20  Temp: 97.8 F (36.6 C)     HEENT-  Normocephalic, no lesions, without obvious abnormality.  Normal external eye and conjunctiva.  Normal TM's bilaterally.  Normal auditory canals and external ears. Normal external nose, mucus membranes and septum.  Normal pharynx.    Neuro:  CN: Pupils are equal and round. They are symmetrically reactive from 3-->2 mm. Looks at me and has right eye exotropic (baseline). Face is symmetric at rest when spoken to moans and puckers lips. Opens eyes briskly to name called.   Motor: UE flaccid and LE with bilateral hip abduction and ankles held in dorsiflexion.   DTRs: 2+, symmetric  Toes downgoing bilaterally. No pathologic reflexes.    Pertinent Labs/Diagnostics: Patient is comfort care now and family not interested in PEG. Prognosis less than 2 weeks. pursuing placement.    Felicie MornDavid Jevaeh Shams PA-C [facility] [phone]  Impression: seizure in setting of UTI and Sepsis. At this time nit talking or following commands. Due to her intermittent encephalopathy there is concern for ongoing seizure. In order to further evaluate [facility].  I have talked with mother and she is ok with this . Discussed with Dr. Arbutus Leasat. We will have her transferred and set up for overnight EEG.    Recommendations: 1) continue current dose of comfort AED 2) [facility]    10/07/2016, 8:16 AM

## 2016-10-07 NOTE — Evaluation (Signed)
SLP Cancellation Note  Patient Details Name: Mikeal HawthorneWendy R Saldivar MRN: [id number] DOB: 01/10/1966   Cancelled treatment:       Reason Eval/Treat Not Completed: Other (comment) (MD cancelled order, [facility] in chart,  MD please reorder when/if indicated)   Mills KollerKimball, Oluwatimileyin Vivier Ann Brae Gartman, MS [facility] SLP [phone]

## 2016-10-07 NOTE — Progress Notes (Signed)
[id number]/Hiren Peplinski,BSN,RN3,CCM:  COMPLETE TRANSFER FORM PRINTED ALONG WITH THE TRANSFORM MEDICAL NECESSITY FORM. FLOOR TO CALL CARE LINK FOR HOSPITAL TO HOSPITAL TRANSFER.

## 2016-10-08 DIAGNOSIS — J690 Pneumonitis due to inhalation of food and vomit: Secondary | ICD-10-CM

## 2016-10-08 DIAGNOSIS — Z0189 Encounter for other specified special examinations: Secondary | ICD-10-CM

## 2016-10-08 DIAGNOSIS — Z7189 Other specified counseling: Secondary | ICD-10-CM

## 2016-10-08 DIAGNOSIS — Z515 Encounter for palliative care: Secondary | ICD-10-CM

## 2016-10-08 LAB — CULTURE, BLOOD (ROUTINE X 2)
CULTURE: NO GROWTH
CULTURE: NO GROWTH
SPECIAL REQUESTS: ADEQUATE
Special Requests: ADEQUATE

## 2016-10-08 LAB — GLUCOSE, CAPILLARY
GLUCOSE-CAPILLARY: 68 mg/dL (ref 65–99)
GLUCOSE-CAPILLARY: 76 mg/dL (ref 65–99)
GLUCOSE-CAPILLARY: 88 mg/dL (ref 65–99)
Glucose-Capillary: 81 mg/dL (ref 65–99)
Glucose-Capillary: 78 mg/dL (ref 65–99)
Glucose-Capillary: 94 mg/dL (ref 65–99)

## 2016-10-08 MED ORDER — DEXTROSE-NACL 5-0.9 % IV SOLN
INTRAVENOUS | Status: DC
  Administered 2016-10-08 – 2016-10-09 (×2): via INTRAVENOUS
  Administered 2016-10-10: 75 mL/h via INTRAVENOUS

## 2016-10-08 NOTE — Progress Notes (Signed)
PROGRESS NOTE  Kristi Perry ZOX:[id number] DOB: 15-Dec-1965 DOA: 09/28/2016 PCP: Angela Cox, MD  Brief History: 51 year old female with cerebral palsy, seizure disorder, aspiration pneumonia, and functional quadriparesis presented to the emergency department after a 20 minute seizure at her skilled nursing facility. Apparently, the patient was given Ativan 2 mg, but she continued to seize. EMS gave Versed 2.5 mg with cessation of the seizure. In the emergency department, the patient was postictal and minimally responsive. She was placed in the stepdown unit and started on intravenous antiepileptic medications. Neurology was consulted to assist. In addition, palliative medicine was consulted as it was initially felt that the patient may not survive. The patient was noted to have an ESBL Escherichia coli UTI. She was not associated with meropenem for 3 days and subsequently received one dose of fosfomycin to complete therapy. During her hospital stay, the patient has had a waxing and waning episodes of alertness.   Assessment/Plan: Acute metabolic encephalopathy improving . -Multifactorial including infection, postictal from seizure state, UTI -Appreciate neurology follow-up  -Continue AEDs per neurology -10/06/16--more alert, awake, and communicative -10/07/16--less responsive, staring into space -10/07/16--discussed with neurology--concerned about recurrent seizure-->transfer to [facility] for continuous EEG - NG tube removed 10/06/16; discussed with neurology--leave out for now pending further discussion with the family about the goals of care . - Change fluids to D5NS .    Sepsis  -presented with fever 102.3 with WBC 27.4, lactic acid 5.55>>>1.0 -secondary to UTI and aspiration -sepsis physiology resolved  Aspiration pneumonia/Acute respiratory failure with hypoxia : resolved. -pt has retained oral secretions with difficulty clearing saliva -discontinue zosyn--finished  7 days abx 10/06/16 -appreciate speech evaluation-->recommend NPO -continue enteral feeding through NG pending family decision on PEG -restart home brovana -Presently stable on 2 L nasal cannula  UTI: resolved. -ESBL EColi -finished 6 days of abx  Seizure Disorder : improved. -continue AEDs per neurology -repeat EEG is not showing any more seizure activity so far. -Continue Keppra 1 g twice a day, Depakote 500 mg 3 times a day, Lamictal 100 mg twice a day, Klonopin 0.5 mg daily -10/01/2016 EEG--Occasional epileptiform discharges over the left temporal region;background slowing with asymmetry, left greater than right   Underlying cerebral palsy, bed bound state, functional quadriplegia -essentially bed bound status -palliative medicine following  Goals of Care -10/05/16--discussed with pt's mother--she has not seen the patient for > 3 months -10/05/16--mother visited patient -mother does not want PEG--but wants transfer to [facility] for EEG monitoring -mother confirms DNR -palliative medicine following .  Palliative approach is recommended , she is Aspirating , NG or PEG tube is not very pleasant option for her and I don,t recommend it , I recommend hospice , appreciate Palliative help .      Disposition Plan: SNF with hospice vs residential hospice Family Communication: pending family meeting .    Consultants: Palliative medicine, neurology  Code Status: DNR  DVT Prophylaxis: [place] Lovenox   Procedures: As Listed in Progress Note Above  Antibiotics: Zosyn 8/1>>>8/5 merrem 7/29>>>8/1 Fosfomycin 8/1 x 1    Subjective:  Awakens easily , responds to the commands , denies any complaints , episodes or agitation controlled with Haldol , no other issues.  Objective: Vitals:   10/08/16 0415 10/08/16 0500 10/08/16 0717 10/08/16 0806  BP: (!) 114/52 (!) 123/59  (!) 106/49  Pulse: 69 (!) 54  (!) 51  Resp: (!) 26 14  15   Temp: 98.7 F (37.1  C)   98 F (36.7  C)  TempSrc: Oral   Oral  SpO2: 97% 100% 94% 96%  Weight:  71.4 kg (157 lb 6.5 oz)    Height:        Intake/Output Summary (Last 24 hours) at 10/08/16 0926 Last data filed at 10/08/16 1610  Gross per 24 hour  Intake             2170 ml  Output              700 ml  Net             1470 ml   Weight change: 1.954 kg (4 lb 4.9 oz) Exam:   General:  Pt is alert,  not in acute distress,  follow commands for me this AM .  HEENT: No icterus, No thrush, No neck mass, [place]/AT  Cardiovascular: RRR, S1/S2, no rubs, no gallops  Respiratory: Bibasilar rales. No wheezing. Good air movement.  Abdomen: Soft/+BS, non tender, non distended, no guarding  Extremities: No edema, No lymphangitis, No petechiae, No rashes, no synovitis   Data Reviewed: I have personally reviewed following labs and imaging studies Basic Metabolic Panel:  Recent Labs Lab 10/02/16 0306 10/02/16 1738 10/05/16 0558 10/06/16 0514 10/07/16 0553  NA 141 143 143 141 143  K 2.9* 3.5 3.9 3.5 3.7  CL 100* 99* 105 103 109  CO2 33* 38* 27 30 27   GLUCOSE 94 84 71 153* 89  BUN 6 6 11 12 8   CREATININE 0.34* <0.30* 0.32* <0.30* <0.30*  CALCIUM 8.2* 8.6* 8.8* 8.7* 8.3*  MG 1.8  --   --   --  2.1   Liver Function Tests:  Recent Labs Lab 10/02/16 0306 10/06/16 0514  AST 17 13*  ALT 19 14  ALKPHOS 54 39  BILITOT 0.6 0.6  PROT 5.2* 4.9*  ALBUMIN 3.0* 2.9*   No results for input(s): LIPASE, AMYLASE in the last 168 hours.  Recent Labs Lab 10/06/16 0514  AMMONIA 26   Coagulation Profile: No results for input(s): INR, PROTIME in the last 168 hours. CBC:  Recent Labs Lab 10/02/16 0306 10/06/16 0514  WBC 7.3 6.1  HGB 12.7 12.7  HCT 37.8 37.8  MCV 91.5 90.6  PLT 162 143*   Cardiac Enzymes: No results for input(s): CKTOTAL, CKMB, CKMBINDEX, TROPONINI in the last 168 hours. BNP: Invalid input(s): POCBNP CBG:  Recent Labs Lab 10/07/16 1228 10/07/16 1956 10/07/16 2356 10/08/16 0400 10/08/16 0808   GLUCAP 75 73 81 68 78   HbA1C: No results for input(s): HGBA1C in the last 72 hours. Urine analysis:    Component Value Date/Time   COLORURINE YELLOW 09/29/2016 0054   APPEARANCEUR CLOUDY (A) 09/29/2016 0054   APPEARANCEUR Hazy 03/18/2012 1233   LABSPEC 1.012 09/29/2016 0054   LABSPEC 1.006 03/18/2012 1233   PHURINE 5.0 09/29/2016 0054   GLUCOSEU 50 (A) 09/29/2016 0054   GLUCOSEU Negative 03/18/2012 1233   HGBUR SMALL (A) 09/29/2016 0054   BILIRUBINUR NEGATIVE 09/29/2016 0054   BILIRUBINUR Negative 03/18/2012 1233   KETONESUR NEGATIVE 09/29/2016 0054   PROTEINUR 100 (A) 09/29/2016 0054   UROBILINOGEN 0.2 11/19/2013 2230   NITRITE NEGATIVE 09/29/2016 0054   LEUKOCYTESUR LARGE (A) 09/29/2016 0054   LEUKOCYTESUR 3+ 03/18/2012 1233   Sepsis Labs: @LABRCNTIP (procalcitonin:4,lacticidven:4) ) Recent Results (from the past 240 hour(s))  Culture, blood (Routine x 2)     Status: None   Collection Time: 09/29/16 12:27 AM  Result Value Ref Range Status  Specimen Description BLOOD LEFT HAND  Final   Special Requests   Final    BOTTLES DRAWN AEROBIC AND ANAEROBIC Blood Culture adequate volume   Culture   Final    NO GROWTH 5 DAYS Performed at [facility], [address] [place], [place] [address]    Report Status 10/04/2016 FINAL  Final  Culture, blood (Routine x 2)     Status: None   Collection Time: 09/29/16 12:27 AM  Result Value Ref Range Status   Specimen Description BLOOD RIGHT ANTECUBITAL  Final   Special Requests   Final    BOTTLES DRAWN AEROBIC AND ANAEROBIC Blood Culture adequate volume   Culture   Final    NO GROWTH 5 DAYS Performed at [facility], [address], [place], [place] [address]    Report Status 10/04/2016 FINAL  Final  Urine culture     Status: Abnormal   Collection Time: 09/29/16 12:53 AM  Result Value Ref Range Status   Specimen Description URINE, CATHETERIZED  Final   Special Requests NONE  Final   Culture (A)  Final    >=100,000  COLONIES/mL ESCHERICHIA COLI Confirmed Extended Spectrum Beta-Lactamase Producer (ESBL) Performed at [facility], [address], [place], [place] [address]    Report Status 10/01/2016 FINAL  Final   Organism ID, Bacteria ESCHERICHIA COLI (A)  Final      Susceptibility   Escherichia coli - MIC*    AMPICILLIN >=32 RESISTANT Resistant     CEFAZOLIN >=64 RESISTANT Resistant     CEFTRIAXONE >=64 RESISTANT Resistant     CIPROFLOXACIN >=4 RESISTANT Resistant     GENTAMICIN <=1 SENSITIVE Sensitive     IMIPENEM <=0.25 SENSITIVE Sensitive     NITROFURANTOIN <=16 SENSITIVE Sensitive     TRIMETH/SULFA <=20 SENSITIVE Sensitive     AMPICILLIN/SULBACTAM 8 SENSITIVE Sensitive     PIP/TAZO <=4 SENSITIVE Sensitive     Extended ESBL POSITIVE Resistant     * >=100,000 COLONIES/mL ESCHERICHIA COLI  MRSA PCR Screening     Status: None   Collection Time: 09/29/16  5:23 AM  Result Value Ref Range Status   MRSA by PCR NEGATIVE NEGATIVE Final    Comment:        The GeneXpert MRSA Assay (FDA approved for NASAL specimens only), is one component of a comprehensive MRSA colonization surveillance program. It is not intended to diagnose MRSA infection nor to guide or monitor treatment for MRSA infections.   Culture, blood (routine x 2)     Status: None (Preliminary result)   Collection Time: 10/03/16  8:06 AM  Result Value Ref Range Status   Specimen Description BLOOD LEFT ARM  Final   Special Requests IN PEDIATRIC BOTTLE Blood Culture adequate volume  Final   Culture   Final    NO GROWTH 4 DAYS Performed at [facility], [address] [place], [place] [address]    Report Status PENDING  Incomplete  Culture, blood (routine x 2)     Status: None (Preliminary result)   Collection Time: 10/03/16  8:06 AM  Result Value Ref Range Status   Specimen Description BLOOD LEFT ARM  Final   Special Requests IN PEDIATRIC BOTTLE Blood Culture adequate volume  Final   Culture   Final    NO GROWTH  4 DAYS Performed at [facility], [address], [place], [place] [address]    Report Status PENDING  Incomplete  Scheduled Meds: . arformoterol  15 mcg Nebulization BID  . chlorhexidine  15 mL Mouth Rinse BID  . clonazePAM  0.5 mg Oral QPC breakfast  . enoxaparin (LOVENOX) injection  40 mg Subcutaneous Q24H  . fentaNYL  25 mcg Transdermal Q72H  . lamoTRIgine  100 mg Oral BID  . mouth rinse  15 mL Mouth Rinse q12n4p   Continuous Infusions: . 0.9 % NaCl with KCl 20 mEq / L 75 mL/hr at 10/08/16 0613  . levETIRAcetam Stopped (10/08/16 0558)  . valproate sodium Stopped (10/08/16 [id number])    Procedures/Studies: Ct Head Wo Contrast  Result Date: 09/29/2016 CLINICAL DATA:  Seizures tonight.  Postictal at this time. EXAM: CT HEAD WITHOUT CONTRAST TECHNIQUE: Contiguous axial images were obtained from the base of the skull through the vertex without intravenous contrast. COMPARISON:  08/26/2012 FINDINGS: Brain: There is no intracranial hemorrhage, mass or evidence of acute infarction. There is mild generalized atrophy. There is moderate chronic microvascular ischemic change. There is no significant extra-axial fluid collection. No acute intracranial findings are evident. Vascular: No hyperdense vessel or unexpected calcification. Skull: Normal. Negative for fracture or focal lesion. Sinuses/Orbits: No acute finding. Other: None. IMPRESSION: No acute intracranial findings. There is mild generalized atrophy and moderate chronic white matter hypodensity which likely represents small vessel ischemic disease. Electronically Signed   By: Ellery Plunkaniel R Mitchell M.D.   On: 09/29/2016 04:01   Dg Chest Port 1 View  Result Date: 10/03/2016 CLINICAL DATA:  Altered mental status, recurrent fevers. EXAM: PORTABLE CHEST 1 VIEW COMPARISON:  Chest x-ray dated September 28, 2016. FINDINGS: Interval placement of an enteric tube with the tip and distal side port in the gastric body. The cardiomediastinal silhouette is  normal. Patchy densities at the left lung base behind the heart appear slightly improved. No pleural effusion or pneumothorax. No acute osseous abnormality. IMPRESSION: Improved patchy density in the left lung behind the heart, likely related to resolving pneumonia and/or aspiration. No new consolidation. Electronically Signed   By: Obie DredgeWilliam T Derry M.D.   On: 10/03/2016 08:30   Dg Chest Port 1 View  Result Date: 09/29/2016 CLINICAL DATA:  Seizure activity.  Fever. EXAM: PORTABLE CHEST 1 VIEW COMPARISON:  11/19/2013 FINDINGS: Shallow inspiration. Elevation of the left hemidiaphragm. Consolidation or atelectasis in the left lung base behind the heart. This could indicate pneumonia. Blunting of the left costophrenic angle suggests a small pleural effusion. No pneumothorax. Normal heart size and pulmonary vascularity. IMPRESSION: Shallow inspiration with elevation of the left hemidiaphragm. Consolidation and effusion in the left base may represent pneumonia. Electronically Signed   By: Burman NievesWilliam  Stevens M.D.   On: 09/29/2016 01:14   Dg Abd Portable 1v  Result Date: 10/06/2016 CLINICAL DATA:  Nasogastric tube placement. EXAM: PORTABLE ABDOMEN - 1 VIEW COMPARISON:  10/04/2016 FINDINGS: Nasogastric tube is seen with tip overlying the body of the stomach. No evidence of dilated bowel loops. IMPRESSION: Nasogastric tube tip overlies body of stomach. Electronically Signed   By: Myles RosenthalJohn  Stahl M.D.   On: 10/06/2016 09:20   Dg Abd Portable 1v  Result Date: 10/04/2016 CLINICAL DATA:  Dysphagia, confirmed NG tube placement. EXAM: PORTABLE ABDOMEN - 1 VIEW COMPARISON:  Abdominal x-ray dated 10/02/2016. FINDINGS: Enteric tube is incompletely imaged at the upper portion of this exam, presumably in the stomach but of uncertain positioning. Bowel gas pattern appears stable. IMPRESSION: Enteric tube is not adequately imaged, only the tip visible at the upper portion of the provided images, tip likely in the stomach but  positioning cannot be more definitively characterized. Consider repeat plain film centered at the lung bases. Electronically Signed   By: Bary Richard M.D.   On: 10/04/2016 21:55   Dg Abd Portable 1v  Result Date: 10/02/2016 CLINICAL DATA:  Nasogastric tube placement. Cerebral palsy. Hiatal hernia. EXAM: PORTABLE ABDOMEN - 1 VIEW COMPARISON:  09/27/2015. Portable chest dated 09/28/2016. Abdomen pelvis CT dated 08/26/2012. FINDINGS: Normal bowel gas pattern. Nasogastric tube tip in the proximal to mid stomach and side hole in the proximal stomach. Probable suprapubic bladder catheter. Mild to moderate dextroconvex thoracolumbar scoliosis. Mild T12 and T9 vertebral compression deformities with no visible acute fracture lines, not present on 08/26/2012 and unchanged compared to 09/28/2016. There is also a mild L4 vertebral compression deformity without significant change since 08/26/2012. Diffuse osteopenia. IMPRESSION: 1. No acute abnormality. 2. Mild, old L4 vertebral compression deformity and probable old mild T9 and T12 vertebral compression deformities, new since 08/26/2012. Electronically Signed   By: Beckie Salts M.D.   On: 10/02/2016 19:46    Hamilton Marinello,MD  [facility]  If 7PM-7AM, please contact night-coverage [email or web address] Password [email or web address] 10/08/2016, 9:26 AM   LOS: 9 days

## 2016-10-08 NOTE — Progress Notes (Signed)
Discontinued LTM; no skin breakdown was seen. 

## 2016-10-08 NOTE — Progress Notes (Signed)
Subjective: Appears improved compared to previous notes.   Exam: Vitals:   10/08/16 0500 10/08/16 0806  BP: (!) 123/59 (!) 106/49  Pulse: (!) 54 (!) 51  Resp: 14 15  Temp:  98 F (36.7 C)   Gen: In bed, NAD Resp: non-labored breathing, no acute distress Abd: soft, nt  Neuro: MS: Awake, tells me her name, follows commands.  GN:FAOZCN:eyes dysconjugate, fixates and tracks.  Motor: she moves both arms to command, does not move LE, spastic.   Impression: 51 yo F with CP and encephalopathy, presented with recurrent seizures without return to baseline. Palliative care has been involved and appreciate their help. [facility] with concern for continued intermittent seizures. With her current improvement, however, I am reassured and feel this is less likley.   Recommendations: 1) Continue lamictal 100mg  BID 2) keppra 500mg  BID 3) continue valproate 500mg  TID(increased from home dose) 4) She will need outpatient lamictal levels as depakote can inhibit metabolism.    Ritta SlotMcNeill Kirkpatrick, MD [facility] [phone]  If 7pm- 7am, please page neurology on call as listed in AMION.

## 2016-10-08 NOTE — Plan of Care (Signed)
Problem: Safety: Goal: Ability to remain free from injury will improve Outcome: Completed/Met Date Met: 10/08/16 Patient is unable to get out of bed; however, room still kept uncluttered and call light within reach. Utilizing bed alarms and siderails padded in case patient has seizure. Currently working on 24 hour EEG as well.  Problem: Health Behavior/Discharge Planning: Goal: Ability to manage health-related needs will improve Outcome: Progressing Patient is from [employer]. Patient's family aware she is here and currently making decisions with palliative medicine to decide plan of care.   Problem: Pain Managment: Goal: General experience of comfort will improve Outcome: Progressing Patient does not complain of pain except when she is already agitated. No signs that she is in pain.   Problem: Physical Regulation: Goal: Ability to maintain clinical measurements within normal limits will improve Outcome: Progressing Patient's mood is inconsistent. When patient is agitated she becomes fixated on one thing and screams it repeatedly until she feels it has been addressed. On multiple occasions during the night the patient screamed louder and louder that her sheets were wet and we wouldn't change them. This was after a change of linens had already been performed. During day shift 8/6- patient was treated with Haldol. During the night haldol was not administered, but patient did get very agitated and took about 10 minutes to relax.  Goal: Will remain free from infection Outcome: Completed/Met Date Met: 10/08/16 Patient was treated for UTI.   Problem: Skin Integrity: Goal: Risk for impaired skin integrity will decrease Outcome: Progressing Patient has MASD to groin/ buttocks/ perineal area. Patient yells out when wiped during peri care. Sacral dressing to bottom and barrier cream applied to MASD.  Problem: Tissue Perfusion: Goal: Risk factors for ineffective tissue perfusion will  decrease Outcome: Progressing Patient receiving Lovenox for her VTE at this time.   Problem: Activity: Goal: Risk for activity intolerance will decrease Outcome: Completed/Met Date Met: 10/08/16 Patient is unable to ambulate- bedbound patient with cerebral palsy; however, she is able to turn in the bed.   Problem: Fluid Volume: Goal: Ability to maintain a balanced intake and output will improve Outcome: Progressing Patient incontinent of urine- attempted to use purewick for accurate I and O's; however, patient raw from MASD and did not want to further damage tissues.   Problem: Nutrition: Goal: Adequate nutrition will be maintained Outcome: Progressing Patient needs SLP evaluation to check for aspiration.   Problem: Bowel/Gastric: Goal: Will not experience complications related to bowel motility Outcome: Completed/Met Date Met: 10/08/16 Patient having small incontinent stools.

## 2016-10-08 NOTE — Procedures (Signed)
  Electroencephalogram report- LTM    Data acquisition: 10-20 electrode placement.  Additional T1, T2, and EKG electrodes; 26 channel digital referential acquisition reformatted to 18 channel/7 channel coronal bipolar     Beginning  : 10/07/16 at 5 12 pm Ending time: 10/08/16 at 08 45 am   Day of study: day 1    This 16  hours of intensive EEG monitoring with simultaneous video monitoring was performed for this patient with spells AMS  as a part of ongoing series to capture events of interest and determine if these are seizures.    Medications: as per EMR  There was no pushbutton activations events during this recording.   Waking background activities marked by somewhat disorganized attenuated 6-7 cps background activities with superimposed muscle and movement artifact.  There was no well-defined posterior dominant rhythm noted.  There was no interictal epileptiform discharges, no clinical subclinical seizures present.  During sleep background activities marked by predominantly delta slowing with superimposed paracentral and anterior dominant faster frequencies in the beta range.  It appears background activities appear to be slightly more attenuated across left hemisphere however findings are supple  EKG did not demonstrate any significant arrhythmia.  Clinical interpretation: This 16 hours of intensive EEG monitoring with simultaneous video monitoring did not record any clinical or subclinical seizures.  Background activities marked by slowing and disorganization suggestive of mild encephalopathy of nonspecific etiologies including toxic metabolic pharmacologic or multifocal degenerative etiologies.  Slight asymmetry of background activities may be suggestive of more prominent neuronal dysfunction across left hemisphere however findings are relatively mild.  Clinical correlation is advised.

## 2016-10-08 NOTE — Care Management Note (Signed)
Case Management Note  Patient Details  Name: Kristi Perry MRN: [id number] Date of Birth: 1965/07/22  Subjective/Objective:  Patient transferred from [place] to come to [facility] for Continous EEG, but before she went to [place] she was at [facility].  Mother does not want her to go back to [facility]. She presents with  Acute met encephalopathy, sepsis, asp pna, acute resp failure, uti, seizure d/o, hx of cerebral palsy, quadriplegia (bed bound).  Palliative seeing patient, mother does not want peg, but wants careful oral feeding and consider residential hospice vs a different SNF with hospice on d/c per palliative note. CSW  Referral.                 Action/Plan: NCM will follow along with CSW for dc needs.   Expected Discharge Date:                  Expected Discharge Plan:  Skilled Nursing Facility  In-House Referral:  Clinical Social Work  Discharge planning Services  CM Consult  Post Acute Care Choice:    Choice offered to:     DME Arranged:    DME Agency:     HH Arranged:    [place] Agency:     Status of Service:  In process, will continue to follow  If discussed at Long Length of Stay Meetings, dates discussed:    Additional Comments:  Zenon Mayo, RN 10/08/2016, 8:12 PM

## 2016-10-08 NOTE — Progress Notes (Signed)
Daily Progress Note   Patient Name: Kristi Perry       Date: 10/08/2016 DOB: 08/28/1965  Age: 51 y.o. MRN#: [id number] Attending Physician: Efrain Sella, MD Primary Care Physician: Angela Cox, MD Admit Date: 09/28/2016  Reason for Consultation/Follow-up: Establishing goals of care  Subjective:  patient is sitting up in bed, she is awake, responds some.  She is legally blind, she gets startled when people enter into her room, she is more comfortable when you announce loudly who you are and why you are in her room  See below:   Length of Stay: 9  Current Medications: Scheduled Meds:  . arformoterol  15 mcg Nebulization BID  . chlorhexidine  15 mL Mouth Rinse BID  . clonazePAM  0.5 mg Oral QPC breakfast  . enoxaparin (LOVENOX) injection  40 mg Subcutaneous Q24H  . fentaNYL  25 mcg Transdermal Q72H  . lamoTRIgine  100 mg Oral BID  . mouth rinse  15 mL Mouth Rinse q12n4p    Continuous Infusions: . dextrose 5 % and 0.9% NaCl 75 mL/hr at 10/08/16 1036  . levETIRAcetam Stopped (10/08/16 0558)  . valproate sodium Stopped (10/08/16 0643)    PRN Meds: acetaminophen, haloperidol lactate, labetalol, LORazepam, LORazepam, RESOURCE THICKENUP CLEAR  Physical Exam         Awake, follows commands Regular work of breathing No distress noted S1 S2 Abdomen soft No edema  Vital Signs: BP 133/70 (BP Location: Left Arm)   Pulse 64   Temp 97.8 F (36.6 C) (Oral)   Resp (!) 23   Ht 4\' 9"  (1.448 m)   Wt 71.4 kg (157 lb 6.5 oz)   LMP  (LMP Unknown) Comment: pt unresponsive   SpO2 96%   BMI 34.06 kg/m  SpO2: SpO2: 96 % O2 Device: O2 Device: Not Delivered O2 Flow Rate: O2 Flow Rate (L/min): 4 L/min  Intake/output summary:  Intake/Output Summary (Last 24 hours) at 10/08/16  1237 Last data filed at 10/08/16 0643  Gross per 24 hour  Intake             2170 ml  Output              700 ml  Net             1470 ml   LBM: Last  BM Date: 10/08/16 Baseline Weight: Weight: 81.6 kg (180 lb) Most recent weight: Weight: 71.4 kg (157 lb 6.5 oz)       Palliative Assessment/Data:    Flowsheet Rows     Most Recent Value  Intake Tab  Referral Department  Hospitalist  Unit at Time of Referral  Intermediate Care Unit  Palliative Care Primary Diagnosis  Other (Comment) [seizure, sepsis, pna, CP]  Palliative Care Type  New Palliative care  Reason for referral  Clarify Goals of Care  Date first seen by Palliative Care  09/29/16  Clinical Assessment  Palliative Performance Scale Score  10%  Pain Max last 24 hours  2  Pain Min Last 24 hours  1  Psychosocial & Spiritual Assessment  Palliative Care Outcomes  Patient/Family meeting held?  No  Palliative Care Outcomes  Other (Comment) [call placed, unable to reach mother ]      Patient Active Problem List   Diagnosis Date Noted  . Feeding difficulty   . Aspiration pneumonia of left lower lobe (HCC) 10/05/2016  . Dysphagia   . Pressure injury of skin 10/02/2016  . Post-ictal coma (HCC) 09/29/2016  . Sepsis (HCC) 11/20/2013  . Unspecified hypothyroidism 11/20/2013  . Seizure (HCC) 11/20/2013  . Aspiration pneumonia (HCC) 11/20/2013  . UTI (urinary tract infection) 11/20/2013  . Acute respiratory failure with hypoxia (HCC) 11/19/2013  . Infection due to ESBL-producing Escherichia coli 04/07/2012  . Thyroid nodule 04/07/2012  . Nephrolithiasis 04/05/2012  . Acute lower UTI 04/03/2012  . HCAP (healthcare-associated pneumonia) 02/27/2012  . Cerebral palsy (HCC) 02/27/2012    Palliative Care Assessment & Plan   Patient Profile:    Assessment:  sepsis Asp pna Seizures Acute metabolic encephalopathy Underlying CP, bed bound at baseline, functional quadriplegia.    Recommendations/Plan: EEG results  discussed with patient's mother over the phone, also discussed with her regarding current condition of the patient, SLP eval and goals of care:  Mother does not want patient to undergo PEG tube placement, she believes that would be contradictory to the patient's and mother's goals and values. Mother prefers careful hand assisted feeding of pureed food with nectar thick fluids, mother understands patient remains an aspiration risk. She believes that comfort oral feeds is much better than NGT or PEG feedings.   Mother states that patient wasn't happy at her current facility prior to this hospitalization. She does not want the patient to go back there. We discussed about hospice philosophy of care.  PLAN: No PEG Careful assisted oral feeding with as much precautions as possible.  Consider residential hospice vs different SNF with hospice on d/c.      Code Status:    Code Status Orders        Start     Ordered   09/29/16 0258  Do not attempt resuscitation (DNR)  Continuous    Question Answer Comment  In the event of cardiac or respiratory ARREST Do not call a "code blue"   In the event of cardiac or respiratory ARREST Do not perform Intubation, CPR, defibrillation or ACLS   In the event of cardiac or respiratory ARREST Use medication by any route, position, wound care, and other measures to relive pain and suffering. May use oxygen, suction and manual treatment of airway obstruction as needed for comfort.      09/29/16 0305    Code Status History    Date Active Date Inactive Code Status Order ID Comments User Context   11/20/2013  1:13 AM 11/23/2013  4:06 PM DNR [id number]  Haydee Monica, MD ED   04/03/2012  9:06 AM 04/07/2012  4:13 PM Full Code [id number]  Orlando Penner, RN Inpatient   02/28/2012  2:00 AM 03/02/2012  7:25 PM DNR [id number]  Rolan Lipa, NP Inpatient   02/27/2012  6:14 PM 02/28/2012  2:00 AM Full Code [id number]  Zerita Boers, RN Inpatient    Advance  Directive Documentation     Most Recent Value  Type of Advance Directive  Out of facility DNR (pink MOST or yellow form)  Pre-existing out of facility DNR order (yellow form or pink MOST form)  Yellow form placed in chart (order not valid for inpatient use)  "MOST" Form in Place?  -       Prognosis:   guarded, could be less than 2 weeks should the patient have another acute neurological event and/or a serious aspiration event.   Discharge Planning:  Ongoing conversations with mother about SNF with hospice versus residential hospice on d.c. Depending on hospital course, PO intake, etc.   Care plan was discussed with mother over the phone   Thank you for allowing the Palliative Medicine Team to assist in the care of this patient.   Time In: 12 Time Out: 12.35 Total Time 35 Prolonged Time Billed  no       Greater than 50%  of this time was spent counseling and coordinating care related to the above assessment and plan.  Rosalin Hawking, MD [phone]   Please contact Palliative Medicine Team phone at [phone] for questions and concerns.

## 2016-10-08 NOTE — Progress Notes (Addendum)
  Speech Language Pathology Treatment: Dysphagia  Patient Details Name: Kristi Perry MRN: [id number] DOB: 04/27/1965 Today's Date: 10/08/2016 Time: [id number] SLP Time Calculation (min) (ACUTE ONLY): 29 min  Assessment / Plan / Recommendation Clinical Impression  Pt today more alert and accepting of po.  She remains weak but voice is clear without suggestion of aspiration of secretions.  RN reports tolerance of po medications with applesauce.    SLP provided po trials of puree/nectar - delay in swallow noted.   Pt impulsive and takes large consecutive boluses if allowed resulting in cough post-swallow.  Bolus control assisted with indications of airway compromise - no overt deficits.  Did not test solids as pt on puree diet at facility. She inquired as to reasoning for puree diet - Advised her it was due to oral deficits from her CP.    Pt reports displeasure with a puree diet - Recommend further discussion regarding this issue as an OP.  SLP to follow up for dysphagia management.    If she is having active seizures and AMS during postictal phases, she will be high risk of recurrent aspiration.       HPI HPI: 51 yo adm from [facility] with seizures.  PMH + for CP, asp pna, functional quadriparesis - bedbound.  Per review of soft chart, pt on puree/thin at SNF.  Pt has not had prior imaging swallow studies.  She was placed on a dys1/thin diet clinically = with intially good tolerance.  Pt became lethargic and NG tube placed for medication administration.  Initial SLP evaluation this admission 7/30 recommended Dys 1 diet and nectar thick liquids but pt become more lethargic and less responsive, therefore SLP signed off and pt was kept NPO. SLP was re-ordered due to improvements noted in mentation.  Pt seen Saturday and trials of po recommended.  Order was dc'd but reorder today to determine ability for pt to take Lamictal.        SLP Plan  Continue with current plan of care        Recommendations  Diet recommendations: Dysphagia 1 (puree);Nectar-thick liquid Medication Administration: Crushed with puree Supervision: Staff to assist with self feeding;Full supervision/cueing for compensatory strategies Compensations: Slow rate;Small sips/bites (observe pt's swallow, cough/throat clear if voice is gurgly)                Oral Care Recommendations: Oral care QID Follow up Recommendations: Skilled Nursing facility SLP Visit Diagnosis: Dysphagia, oropharyngeal phase (R13.12) Plan: Continue with current plan of care       GO              Kristi Burnetamara Imani Fiebelkorn, MS [facility] SLP [phone]   Kristi Perry, Kristi Perry Ann 10/08/2016, 12:15 PM

## 2016-10-09 LAB — COMPREHENSIVE METABOLIC PANEL
ALBUMIN: 2.7 g/dL — AB (ref 3.5–5.0)
ALT: 37 U/L (ref 14–54)
AST: 28 U/L (ref 15–41)
Alkaline Phosphatase: 44 U/L (ref 38–126)
Anion gap: 8 (ref 5–15)
BILIRUBIN TOTAL: 0.5 mg/dL (ref 0.3–1.2)
BUN: <5 mg/dL — ABNORMAL LOW (ref 6–20)
CALCIUM: 8.7 mg/dL — AB (ref 8.9–10.3)
CHLORIDE: 108 mmol/L (ref 101–111)
CO2: 26 mmol/L (ref 22–32)
Creatinine, Ser: 0.32 mg/dL — ABNORMAL LOW (ref 0.44–1.00)
GFR calc Af Amer: >60 mL/min (ref >60)
Glucose, Bld: 98 mg/dL (ref 65–99)
Potassium: 3.4 mmol/L — ABNORMAL LOW (ref 3.5–5.1)
Sodium: 142 mmol/L (ref 135–145)
Total Protein: 4.6 g/dL — ABNORMAL LOW (ref 6.5–8.1)

## 2016-10-09 LAB — CBC
HEMATOCRIT: 37.8 % (ref 36.0–46.0)
HEMOGLOBIN: 12.2 g/dL (ref 12.0–15.0)
MCH: 30.3 pg (ref 26.0–34.0)
MCHC: 32.3 g/dL (ref 30.0–36.0)
MCV: 93.8 fL (ref 78.0–100.0)
Platelets: 147 10*3/uL — ABNORMAL LOW (ref 150–400)
RBC: 4.03 MIL/uL (ref 3.87–5.11)
RDW: 14.2 % (ref 11.5–15.5)
WBC: 3.9 10*3/uL — AB (ref 4.0–10.5)

## 2016-10-09 LAB — GLUCOSE, CAPILLARY
GLUCOSE-CAPILLARY: 73 mg/dL (ref 65–99)
GLUCOSE-CAPILLARY: 72 mg/dL (ref 65–99)
GLUCOSE-CAPILLARY: 87 mg/dL (ref 65–99)
GLUCOSE-CAPILLARY: 106 mg/dL — AB (ref 65–99)
Glucose-Capillary: 114 mg/dL — ABNORMAL HIGH (ref 65–99)
Glucose-Capillary: 76 mg/dL (ref 65–99)

## 2016-10-09 NOTE — Progress Notes (Signed)
Subjective: Appears improved compared to yesterday  Exam: Vitals:   10/09/16 0700 10/09/16 0719  BP: (!) 107/52 (!) 107/52  Pulse: (!) 51 (!) 52  Resp: (!) 9 11  Temp:  97.6 F (36.4 C)   Gen: In bed, NAD Resp: non-labored breathing, no acute distress Abd: soft, nt  Neuro: MS: Awake, tells me her name, follows commands.  ZO:XWRUCN:eyes dysconjugate, Fixates and tracks, able to count fingers.  Motor: she moves both arms to command, does not move LE, spastic.   Impression: 51 yo F with CP and encephalopathy, presented with recurrent seizures without return to baseline. Palliative care has been involved and appreciate their help. [facility] with concern for continued intermittent seizures. With her current improvement, however, I am reassured and feel this is less likley. She had no seizures on continuous EEG performed from 8/6-8/7.  Recommendations: 1) Continue lamictal 100mg  BID 2) keppra 500mg  BID 3) continue valproate 500mg  TID(increased from home dose) 4) lamictal/depakote levels.    Ritta SlotMcNeill Demetria Lightsey, MD [facility] [phone]  If 7pm- 7am, please page neurology on call as listed in AMION.

## 2016-10-09 NOTE — Plan of Care (Signed)
Problem: Health Behavior/Discharge Planning: Goal: Ability to manage health-related needs will improve Outcome: Progressing Family has decided to pursue hospice for this patient. Social work to organize.  Problem: Physical Regulation: Goal: Ability to maintain clinical measurements within normal limits will improve Outcome: Progressing Patient asleep for most of shift but still arousable. Continuing to monitor for changes in mentation. No evidence of seizure activity on PM shift.

## 2016-10-09 NOTE — Progress Notes (Signed)
Nutrition Follow-up  DOCUMENTATION CODES:   Obesity unspecified  INTERVENTION:    Continue Dysphagia 1-Nectar thick liquid diet  NEW NUTRITION DIAGNOSIS:   Increased nutrient needs related to wound healing as evidenced by estimated needs  Ongoing  GOAL:   Patient will meet greater than or equal to 90% of their needs  Progressing   MONITOR:   PO intake, Labs, Weight trends, Skin, I & O's  ASSESSMENT:   51 y.o. female with medical history significant of seizures, CP, chronic pain syndrome, PNA and aspiration PNA.  Patient brought in to ED after apparent 20 min seizure at SNF.  Was given 2mg  ativan at SNF which didn't stop seizure.  Was given 2.5mg  versed by EMS which did stop seizure.  Patient is post-ictal and essentially unresponsive in ED and not able to contribute to history.  TF (Osmolite 1.2 formula) discontinued via NGT 8/5. S/p dysphagia treatment 8/7. Diet advanced to Dys 1-Nectar thick liquids. Pt ate 100% of her breakfast this AM per RN.  Noted family has decided to pursue Hospice. Medications and labs reviewed. K 3.4 (L). CBG's [id number].  Diet Order:  DIET - DYS 1 Room service appropriate? Yes; Fluid consistency: Nectar Thick  Skin:  Wound (see comment) (Stage 2 R buttocks pressure injury)  Last BM:  8/7  Height:   Ht Readings from Last 1 Encounters:  09/29/16 4\' 9"  (1.448 m)   Weight:   Wt Readings from Last 1 Encounters:  10/09/16 172 lb 9.9 oz (78.3 kg)   Ideal Body Weight:  41.45 kg  BMI:  Body mass index is 37.35 kg/m.  Estimated Nutritional Needs:   Kcal:  1500-1700  Protein:  70-80 gm  Fluid:  1.5-1.7 L  EDUCATION NEEDS:   No education needs identified at this time  Maureen ChattersKatie Roselynn Whitacre, RD, LDN Pager #: [phone] After-Hours Pager #: [phone]

## 2016-10-09 NOTE — Progress Notes (Signed)
PROGRESS NOTE  Kristi Perry ZOX:[id number] DOB: 03/14/1965 DOA: 09/28/2016 PCP: Angela Coxasanayaka, [name], MD  HPI/Recap of past 24 hours:  Patient is alert and interactive, family(sister in law) at bedside, reports she has improved, today she seems to has come back close to her baseline.  Assessment/Plan: Principal Problem:   Post-ictal coma (HCC) Active Problems:   HCAP (healthcare-associated pneumonia)   Acute lower UTI   Acute respiratory failure with hypoxia (HCC)   Sepsis (HCC)   Seizure (HCC)   Pressure injury of skin   Dysphagia   Aspiration pneumonia of left lower lobe (HCC)   Feeding difficulty   Encounter for palliative care   Goals of care, counseling/discussion   Acute metabolic encephalopathy improving . -Multifactorial including infection, postictal from seizure state, UTI -she was transferred from [place] to [place] with recurrent seizures without return to baseline   -no seizures on Continue AEDs from 8/6 to 8/7 per neurology - NG tube removed 10/06/16; she has passed speech swallow  With Dysphagia 1 (puree);Nectar-thick liquid. Crushed with puree -she is on lamictal, keppra and increase dose of valproate, neurology continue to follow, input appreciated  - continue D5NS , d/c ivf if oral intake is reliable -palliative care to continue goals of care discussion     Sepsis  -presented with fever 102.3 with WBC 27.4, lactic acid 5.55>>>1.0 -secondary to UTI and aspiration -sepsis physiology resolved  Aspiration pneumonia/Acute respiratory failure with hypoxia : resolved. -pt has retained oral secretions with difficulty clearing saliva initially, she had ng placement initially -discontinue zosyn--finished 7 days abx 10/06/16 -mental status seems improving, she passed swallow eval with diet as described above. -continue aspiration precaution, palliative care to continue goals of care discussion   UTI: resolved. -ESBL EColi -finished 6 days of  abx  Seizure Disorder : improved. --10/01/2016 EEG--Occasional epileptiform discharges over the left temporal region;background slowing with asymmetry, left greater than right -repeat EEG is not showing any more seizure activity so far. -Continue Keppra 1 g twice a day, Depakote 500 mg 3 times a day, Lamictal 100 mg twice a day, Klonopin 0.5 mg daily -follow neurology input  Underlying cerebral palsy, bed bound state, functional quadriplegia -essentially bed bound status, has been in a group home for many years -palliative medicine following  Goals of Care -10/05/16--discussed with pt's mother--she has not seen the patient for > 3 months -10/05/16--mother visited patient -mother does not want PEG--[facility] -mother confirms DNR -palliative medicine following .  Palliative approach is recommended , she is Aspirating , NG or PEG tube is not very pleasant option for her and I don,t recommend it , I recommend hospice , appreciate Palliative help .   i have presonally reviewed patient's tele strips, has been in sinus rhythm since admission, will d/c  Tele.  Disposition Plan: SNF with hospice vs residential hospice Family Communication: pending family meeting .    Consultants: Palliative medicine, neurology  Code Status: DNR  DVT Prophylaxis: [place] Lovenox   Procedures: As Listed in Progress Note Above  Antibiotics: Zosyn 8/1>>>8/5 merrem 7/29>>>8/1 Fosfomycin 8/1 x 1   Procedures:  Continuous EEG  Ng placement and removal     Objective: BP (!) 107/52 (BP Location: Left Arm)   Pulse (!) 52   Temp 97.6 F (36.4 C) (Oral)   Resp 11   Ht 4\' 9"  (1.448 m)   Wt 78.3 kg (172 lb 9.9 oz)   LMP  (LMP Unknown) Comment:  pt unresponsive   SpO2 100%   BMI 37.35 kg/m   Intake/Output Summary (Last 24 hours) at 10/09/16 6962 Last data filed at 10/09/16 0703  Gross per 24 hour  Intake             1905 ml  Output                 0 ml  Net             1905 ml   Filed Weights   10/07/16 0511 10/08/16 0500 10/09/16 0441  Weight: 69.4 kg (153 lb 1.6 oz) 71.4 kg (157 lb 6.5 oz) 78.3 kg (172 lb 9.9 oz)    Exam: Patient is examined daily including today on 10/09/2016, exam remain the same as of yesterday except that is highlighted below   General:  She is alert, she talks in distinct words, she knows she is in the hospital today, she can not tell me her sister in law's name, she is not oriented to time  Cardiovascular: RRR  Respiratory: CTABL  Abdomen: Soft/ND/NT, positive BS  Musculoskeletal: No Edema  Neuro: She is alert, she talks in distinct words, she knows she is in the hospital today, she can not tell me her sister in law's name, she is not oriented to time. She moves both arms spontaneously, she does not mover lower extremities which are atrophic and spastic  Chronically.    Data Reviewed: Basic Metabolic Panel:  Recent Labs Lab 10/02/16 1738 10/05/16 0558 10/06/16 0514 10/07/16 0553 10/09/16 0317  NA 143 143 141 143 142  K 3.5 3.9 3.5 3.7 3.4*  CL 99* 105 103 109 108  CO2 38* 27 30 27 26   GLUCOSE 84 71 153* 89 98  BUN 6 11 12 8  <5*  CREATININE <0.30* 0.32* <0.30* <0.30* 0.32*  CALCIUM 8.6* 8.8* 8.7* 8.3* 8.7*  MG  --   --   --  2.1  --    Liver Function Tests:  Recent Labs Lab 10/06/16 0514 10/09/16 0317  AST 13* 28  ALT 14 37  ALKPHOS 39 44  BILITOT 0.6 0.5  PROT 4.9* 4.6*  ALBUMIN 2.9* 2.7*   No results for input(s): LIPASE, AMYLASE in the last 168 hours.  Recent Labs Lab 10/06/16 0514  AMMONIA 26   CBC:  Recent Labs Lab 10/06/16 0514 10/09/16 0317  WBC 6.1 3.9*  HGB 12.7 12.2  HCT 37.8 37.8  MCV 90.6 93.8  PLT 143* 147*   Cardiac Enzymes:   No results for input(s): CKTOTAL, CKMB, CKMBINDEX, TROPONINI in the last 168 hours. BNP (last 3 results) No results for input(s): BNP in the last 8760 hours.  ProBNP (last 3 results) No results for input(s):  PROBNP in the last 8760 hours.  CBG:  Recent Labs Lab 10/08/16 1622 10/08/16 2022 10/09/16 0004 10/09/16 0431 10/09/16 0718  GLUCAP 88 94 76 73 72    Recent Results (from the past 240 hour(s))  Culture, blood (routine x 2)     Status: None   Collection Time: 10/03/16  8:06 AM  Result Value Ref Range Status   Specimen Description BLOOD LEFT ARM  Final   Special Requests IN PEDIATRIC BOTTLE Blood Culture adequate volume  Final   Culture   Final    NO GROWTH 5 DAYS Performed at [facility], [address], [place], [place] [address]    Report Status 10/08/2016 FINAL  Final  Culture, blood (routine x 2)  Status: None   Collection Time: 10/03/16  8:06 AM  Result Value Ref Range Status   Specimen Description BLOOD LEFT ARM  Final   Special Requests IN PEDIATRIC BOTTLE Blood Culture adequate volume  Final   Culture   Final    NO GROWTH 5 DAYS Performed at [facility], [address] [place], [place] [address]    Report Status 10/08/2016 FINAL  Final     Studies: No results found.  Scheduled Meds: . arformoterol  15 mcg Nebulization BID  . chlorhexidine  15 mL Mouth Rinse BID  . clonazePAM  0.5 mg Oral QPC breakfast  . enoxaparin (LOVENOX) injection  40 mg Subcutaneous Q24H  . fentaNYL  25 mcg Transdermal Q72H  . lamoTRIgine  100 mg Oral BID  . mouth rinse  15 mL Mouth Rinse q12n4p    Continuous Infusions: . dextrose 5 % and 0.9% NaCl 75 mL/hr at 10/09/16 0232  . levETIRAcetam Stopped (10/09/16 4696)  . valproate sodium Stopped (10/09/16 0703)     Time spent: I have personally reviewed and interpreted daily labs, tele strips, imagings as discussed above under date review session and assessment and plans. I have updated family at bed side at length.   Bush Murdoch MD, PhD  [facility] Pager [phone]. If 7PM-7AM, please contact night-coverage at [email or web address], password [facility] 10/09/2016, 8:23 AM  LOS: 10 days

## 2016-10-10 DIAGNOSIS — R633 Feeding difficulties: Secondary | ICD-10-CM

## 2016-10-10 LAB — BASIC METABOLIC PANEL
ANION GAP: 11 (ref 5–15)
CHLORIDE: 109 mmol/L (ref 101–111)
CO2: 24 mmol/L (ref 22–32)
Calcium: 8.7 mg/dL — ABNORMAL LOW (ref 8.9–10.3)
Creatinine, Ser: 0.41 mg/dL — ABNORMAL LOW (ref 0.44–1.00)
Glucose, Bld: 85 mg/dL (ref 65–99)
POTASSIUM: 3.7 mmol/L (ref 3.5–5.1)
SODIUM: 144 mmol/L (ref 135–145)

## 2016-10-10 LAB — CBC
HCT: 39.5 % (ref 36.0–46.0)
HEMOGLOBIN: 12.8 g/dL (ref 12.0–15.0)
MCH: 30.9 pg (ref 26.0–34.0)
MCHC: 32.4 g/dL (ref 30.0–36.0)
MCV: 95.4 fL (ref 78.0–100.0)
PLATELETS: 170 10*3/uL (ref 150–400)
RBC: 4.14 MIL/uL (ref 3.87–5.11)
RDW: 14.8 % (ref 11.5–15.5)
WBC: 4.3 10*3/uL (ref 4.0–10.5)

## 2016-10-10 LAB — MAGNESIUM: MAGNESIUM: 2.2 mg/dL (ref 1.7–2.4)

## 2016-10-10 LAB — GLUCOSE, CAPILLARY
GLUCOSE-CAPILLARY: 108 mg/dL — AB (ref 65–99)
GLUCOSE-CAPILLARY: 123 mg/dL — AB (ref 65–99)
GLUCOSE-CAPILLARY: 80 mg/dL (ref 65–99)
GLUCOSE-CAPILLARY: 92 mg/dL (ref 65–99)
Glucose-Capillary: 95 mg/dL (ref 65–99)

## 2016-10-10 LAB — VALPROIC ACID LEVEL: VALPROIC ACID LVL: 65 ug/mL (ref 50.0–100.0)

## 2016-10-10 MED ORDER — POTASSIUM CHLORIDE ER 10 MEQ PO TBCR: 10.0000 meq | EXTENDED_RELEASE_TABLET | Freq: Every day | ORAL | 0 refills | Status: DC

## 2016-10-10 MED ORDER — FUROSEMIDE 20 MG PO TABS: 20.0000 mg | ORAL_TABLET | ORAL | 0 refills | Status: AC

## 2016-10-10 MED ORDER — RESOURCE THICKENUP CLEAR PO POWD: ORAL | 0 refills | Status: DC

## 2016-10-10 MED ORDER — VALPROATE SODIUM 250 MG/5ML PO SOLN
500.0000 mg | Freq: Three times a day (TID) | ORAL | Status: DC
  Administered 2016-10-10: 500 mg via ORAL
  Filled 2016-10-10: qty 10

## 2016-10-10 MED ORDER — CLONAZEPAM 0.5 MG PO TABS: 0.5000 mg | ORAL_TABLET | Freq: Every day | ORAL | 0 refills | Status: DC

## 2016-10-10 MED ORDER — GABAPENTIN 250 MG/5ML PO SOLN: 300.0000 mg | Freq: Three times a day (TID) | ORAL | Status: DC

## 2016-10-10 MED ORDER — VENLAFAXINE HCL ER 75 MG PO CP24: 75.0000 mg | ORAL_CAPSULE | Freq: Every day | ORAL | 0 refills | Status: DC

## 2016-10-10 MED ORDER — FENTANYL 50 MCG/HR TD PT72: 50.0000 ug | MEDICATED_PATCH | TRANSDERMAL | 0 refills | Status: AC

## 2016-10-10 MED ORDER — LEVETIRACETAM 100 MG/ML PO SOLN: 500.0000 mg | Freq: Two times a day (BID) | ORAL | 12 refills | Status: AC

## 2016-10-10 MED ORDER — LEVETIRACETAM 100 MG/ML PO SOLN
500.0000 mg | Freq: Two times a day (BID) | ORAL | Status: DC
  Filled 2016-10-10: qty 5

## 2016-10-10 MED ORDER — CLONAZEPAM 0.5 MG PO TABS: 0.5000 mg | ORAL_TABLET | Freq: Two times a day (BID) | ORAL | 0 refills | Status: AC | PRN

## 2016-10-10 MED ORDER — VALPROATE SODIUM 250 MG/5ML PO SOLN: 500.0000 mg | Freq: Three times a day (TID) | ORAL | 0 refills | Status: AC

## 2016-10-10 NOTE — Clinical Social Work Note (Signed)
Pt's family has accepted bed at [facility]. Pts family had concern regarding pt transferring today. CSW spoke with MD. MD to speak with family. Pt will need PTAR for transfer.   LockridgeBridget Nikaela Coyne, [place] [phone]

## 2016-10-10 NOTE — Care Management Note (Signed)
Case Management Note  Patient Details  Name: Kristi Perry MRN: [id number] Date of Birth: 20-Dec-1965  Subjective/Objective:    Patient transferred from [place] to come to [facility] for Continous EEG, but before she went to [place] she was at [facility].  Mother does not want her to go back to [facility]. She presents with  Acute met encephalopathy, sepsis, asp pna, acute resp failure, uti, seizure d/o, hx of cerebral palsy, quadriplegia (bed bound).  Palliative seeing patient, mother does not want peg, but wants careful oral feeding and consider residential hospice vs a different SNF with hospice on d/c per palliative note. CSW  Referral.   8/9 [place], BSN - for dc to SNF today, CSW facilitating.                              Action/Plan:   Expected Discharge Date:  10/10/16               Expected Discharge Plan:  Skilled Nursing Facility  In-House Referral:  Clinical Social Work  Discharge planning Services  CM Consult  Post Acute Care Choice:    Choice offered to:     DME Arranged:    DME Agency:     HH Arranged:    [place] Agency:     Status of Service:  Completed, signed off  If discussed at [employer], dates discussed:    Additional Comments:  Zenon Mayo, RN 10/10/2016, 3:33 PM

## 2016-10-10 NOTE — Progress Notes (Addendum)
Called hand off report to [facility] and spoke with Rayvon CharLindsay S. Awaiting transport.  Patient was discharged with no belongings at bedside. Patient had artificial flowers in a vase; contacted mother and left a voicemail on cell phone listed.

## 2016-10-10 NOTE — Discharge Summary (Signed)
Discharge Summary  Kristi Perry ZOX:[id number] DOB: 04/11/1965  PCP: Angela Coxasanayaka, [name], MD  Admit date: 09/28/2016 Discharge date: 10/10/2016  Time spent: >5930mins, more than 50% spent on coordination of care  Recommendations for Outpatient Follow-up:  1. F/u with SNF MD  for hospital discharge follow up, repeat cbc/bmp at follow up 2. F/u with palliative care at snf, transition to comfort measures if family desires. 3. F/u with neurology as needed for h/o seizures  Discharge Diagnoses:  Active Hospital Problems   Diagnosis Date Noted  . Post-ictal coma (HCC) 09/29/2016  . Encounter for palliative care   . Goals of care, counseling/discussion   . Feeding difficulty   . Aspiration pneumonia of left lower lobe (HCC) 10/05/2016  . Dysphagia   . Pressure injury of skin 10/02/2016  . Sepsis (HCC) 11/20/2013  . Seizure (HCC) 11/20/2013  . Acute respiratory failure with hypoxia (HCC) 11/19/2013  . Acute lower UTI 04/03/2012  . HCAP (healthcare-associated pneumonia) 02/27/2012    Resolved Hospital Problems   Diagnosis Date Noted Date Resolved  No resolved problems to display.    Discharge Condition: stable  Diet recommendation:  Aspiration precaution ( speech eval prn) Diet recommendations: Dysphagia 1 (puree);Nectar-thick liquid Medication Administration: Crushed with puree Supervision: Staff to assist with self feeding;Full supervision/cueing for compensatory strategies Compensations: Slow rate;Small sips/bites (observe pt's swallow, cough/throat clear if voice is gurgly)  [employer]   10/09/16 2351 10/10/16 0500 10/10/16 0907  Weight: 75.2 kg (165 lb 12.6 oz) 75.4 kg (166 lb 3.6 oz) 78.7 kg (173 lb 8 oz)    History of present illness: (per admitting physician) PCP: Angela Coxasanayaka, [name], MD  [facility]  I have personally briefly reviewed [facility]  Chief Complaint:  Seizures  HPI: Kristi Perry is a 51 y.o. female with medical history significant of seizures, CP, chronic pain syndrome, PNA and aspiration PNA.  Patient brought in to ED after apparent 20 min seizure at SNF.  Was given 2mg  ativan at SNF which didn't stop seizure.  Was given 2.5mg  versed by EMS which did stop seizure.  Patient is post-ictal and essentially unresponsive in ED and not able to contribute to history.   ED Course: Tm 102.3.  Tachycardic to 130s initially which improves to 105 after IVF.  Was sating mid 6680s this improves with O2 via NRB, now sating 98% on NRB.  WBC 27.4k.  Lactate was obtained and was 5.55 (not sure if due to sepsis or seizure though.  UA shows UTI, CXR shows LLL PNA.  Hospital Course:  Principal Problem:   Post-ictal coma (HCC) Active Problems:   HCAP (healthcare-associated pneumonia)   Acute lower UTI   Acute respiratory failure with hypoxia (HCC)   Sepsis (HCC)   Seizure (HCC)   Pressure injury of skin   Dysphagia   Aspiration pneumonia of left lower lobe (HCC)   Feeding difficulty   Encounter for palliative care   Goals of care, counseling/discussion   Acute metabolic encephalopathy improving . -Multifactorial including infection (uit, aspiration pna) and postictal from seizure, she required NG placement for meds and nutrition due to continued encaphalopathy initially -she was transferred from [place] to [place] with recurrent seizures without return to baseline   -no seizures on continuousEEG from 8/6 to 8/7 per neurology -she has improved, NG tube removed 10/06/16; she passed speech swallow  With Dysphagia 1 (puree);Nectar-thick liquid. meds Crushed with puree -she is  discharged on lamictal, keppra and increase dose of valproate per neurology recommendation. - -palliative care to continue goals of care discussion ( patient is DNR, family is considering transition to comfort measures should patient suffers from another decompensation for any  reason in the near future.   Seizure Disorder : improved. --10/01/2016 EEG--Occasional epileptiform discharges over the left temporal region;background slowing with asymmetry, left greater than right -repeat EEG is not showing any more seizure activity so far. -Continue Keppra 500mg  twice a day, volproate 500 mg 3 times a day, Lamictal 100 mg twice a day, Klonopin 0.5 mg daily -home meds that could potentially reduce seizure threshold  Including clozapine,  risperidone, baclofen, venlafaxine held on admission and discontinued at discharge.  -neurology follow up on outpatient basis as needed  Sepsis presented on admission  -presented with fever 102.3 with WBC 27.4, lactic acid 5.55>>>1.0 -secondary to UTI and aspiration pna. -sepsis physiologyresolved  Aspiration pneumonia/Acute respiratory failure with hypoxia : resolved. -pt has retained oral secretions with difficulty clearing saliva initially, she had ng placement initially -discontinue zosyn--finished 7 days abx on 10/06/16 -mental status seems improving, she passed swallow eval with diet as described above. -continue aspiration precaution, mother decided on PEG tube placement, mother is considering transition to comfort measures should decompensate for any reason in the  Near future, please refer to original palliative care notes.  -palliative care to continue follow patient at SNF   UTI: resolved. -ESBL EColi -finished 6 days of abx   Underlying cerebral palsy, bed bound state, functional quadriplegia -essentially bed bound status, has been in a group home for many years -palliative medicine following  Chronic pain, she is discharged on fentanyl patch ( dose reduced compare to previous home dose), snf to continue adjust pain meds . She did not require any prn pain meds during hospitalization.  Body mass index is 37.55 kg/m.   Goals of Care -10/05/16--discussed with pt's mother--she has not seen the patient for > 3  months -10/05/16 and 8/9--mother and father visited patient -mother does not want PEG -mother confirms DNR -palliative medicine following .  she is Aspirating , NG or PEG tube is not very pleasant option for her , mother decided against PEG tube palcement. Mother is considering transition to full comfort measures should patient suffers another acute decompensation for any reason Palliative care continue to follow patient at SNF.    Disposition Plan: SNF with palliative care  Family Communication: mother and father at bedside prior to discharge.   Consultants: Palliative medicine, neurology  Code Status: DNR  DVT Prophylaxis while in the hospital: Winter Lovenox   Procedures: As Listed in Progress Note Above  Antibiotics: Zosyn 8/1>>>8/5 merrem 7/29>>>8/1 Fosfomycin 8/1 x 1   Procedures:  Continuous EEG  Ng placement and removal   Discharge Exam: BP 110/65 (BP Location: Left Arm)   Pulse 80   Temp 98.4 F (36.9 C) (Oral)   Resp 18   Ht 4\' 9"  (1.448 m)   Wt 78.7 kg (173 lb 8 oz)   LMP  (LMP Unknown) Comment: pt unresponsive   SpO2 100%   BMI 37.55 kg/m   General: She is alert, she talks in distinct words, she is oriented to self only today, She is cooperative and follows commands Cardiovascular: RRR Respiratory: CTABL Abdomen: Soft/ND/NT, positive BS Musculoskeletal: No Edema Neuro: She is alert, she talks in distinct words, She moves both arms spontaneously, she does not mover lower extremities which are atrophic and spastic  Chronically.  Discharge  Instructions You were cared for by a hospitalist during your hospital stay. If you have any questions about your discharge medications or the care you received while you were in the hospital after you are discharged, you can call the unit and asked to speak with the hospitalist on call if the hospitalist that took care of you is not available. Once you are discharged, your primary care physician  will handle any further medical issues. Please note that NO REFILLS for any discharge medications will be authorized once you are discharged, as it is imperative that you return to your primary care physician (or establish a relationship with a primary care physician if you do not have one) for your aftercare needs so that they can reassess your need for medications and monitor your lab values.  Discharge Instructions    Diet general    Complete by:  As directed    Diet recommendations: Dysphagia 1 (puree);Nectar-thick liquid Medication Administration: Crushed with puree Supervision: Staff to assist with self feeding;Full supervision/cueing for compensatory strategies Compensations: Slow rate;Small sips/bites (observe pt's swallow, cough/throat clear if voice is gurgly)  Strict aspiration precaution   Increase activity slowly    Complete by:  As directed      Allergies as of 10/10/2016      Reactions   Sulfa Antibiotics Hives   Tape Rash   Paper tape      Medication List    STOP taking these medications   baclofen 10 MG tablet Commonly known as:  LIORESAL   cloZAPine 100 MG tablet Commonly known as:  CLOZARIL   divalproex 250 MG DR tablet Commonly known as:  DEPAKOTE   fentaNYL 75 MCG/HR Commonly known as:  DURAGESIC - dosed mcg/hr Replaced by:  fentaNYL 50 MCG/HR   HYDROcodone-acetaminophen 5-325 MG tablet Commonly known as:  NORCO/VICODIN   levETIRAcetam 250 MG tablet Commonly known as:  KEPPRA Replaced by:  levETIRAcetam 100 MG/ML solution   LORazepam 2 MG/ML concentrated solution Commonly known as:  ATIVAN   risperiDONE 2 MG tablet Commonly known as:  RISPERDAL   risperidone 4 MG tablet Commonly known as:  RISPERDAL   senna 8.6 MG Tabs tablet Commonly known as:  SENOKOT   venlafaxine XR 150 MG 24 hr capsule Commonly known as:  EFFEXOR-XR   venlafaxine XR 75 MG 24 hr capsule Commonly known as:  EFFEXOR-XR     TAKE these medications   acetaminophen 325  MG tablet Commonly known as:  TYLENOL Take 650 mg by mouth every 4 (four) hours as needed for mild pain or moderate pain.   albuterol (2.5 MG/3ML) 0.083% nebulizer solution Commonly known as:  PROVENTIL Take 2.5 mg by nebulization every 8 (eight) hours as needed for wheezing or shortness of breath.   albuterol 108 (90 Base) MCG/ACT inhaler Commonly known as:  PROVENTIL HFA;VENTOLIN HFA Inhale 2 puffs into the lungs every 4 (four) hours as needed for wheezing or shortness of breath.   antiseptic oral rinse Liqd 15 mLs by Mouth Rinse route as needed for dry mouth.   arformoterol 15 MCG/2ML Nebu Commonly known as:  BROVANA Take 15 mcg by nebulization 2 (two) times daily.   budesonide 0.25 MG/2ML nebulizer solution Commonly known as:  PULMICORT Take 0.25 mg by nebulization 2 (two) times daily as needed (SOB, wheezing).   clonazePAM 0.5 MG tablet Commonly known as:  KLONOPIN Take 1 tablet (0.5 mg total) by mouth 2 (two) times daily as needed for anxiety. What changed:  You were already  taking a medication with the same name, and this prescription was added. Make sure you understand how and when to take each.   clonazePAM 0.5 MG tablet Commonly known as:  KLONOPIN Take 1 tablet (0.5 mg total) by mouth daily after breakfast. What changed:  medication strength  how much to take  when to take this   fentaNYL 50 MCG/HR Commonly known as:  DURAGESIC Place 1 patch (50 mcg total) onto the skin every 3 (three) days. Replaces:  fentaNYL 75 MCG/HR   fexofenadine-pseudoephedrine 60-120 MG 12 hr tablet Commonly known as:  ALLEGRA-D Take 1 tablet by mouth daily.   fluticasone 50 MCG/ACT nasal spray Commonly known as:  FLONASE Place 2 sprays into both nostrils daily.   furosemide 20 MG tablet Commonly known as:  LASIX Take 1 tablet (20 mg total) by mouth every Monday, Wednesday, and Friday. What changed:  when to take this   ketotifen 0.025 % ophthalmic solution Commonly known  as:  ZADITOR Place 1 drop into both eyes 2 (two) times daily.   lamoTRIgine 100 MG tablet Commonly known as:  LAMICTAL Take 100 mg by mouth 2 (two) times daily.   levETIRAcetam 100 MG/ML solution Commonly known as:  KEPPRA Take 5 mLs (500 mg total) by mouth 2 (two) times daily. Replaces:  levETIRAcetam 250 MG tablet   MELATIN PO Take 3 mg by mouth at bedtime.   montelukast 10 MG tablet Commonly known as:  SINGULAIR Take 10 mg by mouth at bedtime.   potassium chloride 10 MEQ tablet Commonly known as:  K-DUR Take 1 tablet (10 mEq total) by mouth daily. What changed:  when to take this   pravastatin 40 MG tablet Commonly known as:  PRAVACHOL Take 40 mg by mouth daily.   RESOURCE THICKENUP CLEAR Powd For nectar thick liquid   sodium chloride 0.65 % nasal spray Commonly known as:  OCEAN Place 1 spray into the nose 2 (two) times daily.   Valproate Sodium 250 MG/5ML Soln solution Commonly known as:  DEPAKENE Take 10 mLs (500 mg total) by mouth every 8 (eight) hours.   vitamin C 500 MG tablet Commonly known as:  ASCORBIC ACID Take 500 mg by mouth 2 (two) times daily.   Vitamin D (Ergocalciferol) 50000 units Caps capsule Commonly known as:  DRISDOL Take 50,000 Units by mouth every 7 (seven) days.      Allergies  Allergen Reactions  . Sulfa Antibiotics Hives  . Tape Rash    Paper tape   Contact information for after-discharge care    Destination    [facility] .   Specialty:  Skilled Nursing Facility Contact information: [address] [place] [address] [phone]               The results of significant diagnostics from this hospitalization (including imaging, microbiology, ancillary and laboratory) are listed below for reference.    Significant Diagnostic Studies: Ct Head Wo Contrast  Result Date: 09/29/2016 CLINICAL DATA:  Seizures tonight.  Postictal at this time. EXAM: CT HEAD WITHOUT CONTRAST TECHNIQUE:  Contiguous axial images were obtained from the base of the skull through the vertex without intravenous contrast. COMPARISON:  08/26/2012 FINDINGS: Brain: There is no intracranial hemorrhage, mass or evidence of acute infarction. There is mild generalized atrophy. There is moderate chronic microvascular ischemic change. There is no significant extra-axial fluid collection. No acute intracranial findings are evident. Vascular: No hyperdense vessel or unexpected calcification. Skull: Normal. Negative for fracture or focal  lesion. Sinuses/Orbits: No acute finding. Other: None. IMPRESSION: No acute intracranial findings. There is mild generalized atrophy and moderate chronic white matter hypodensity which likely represents small vessel ischemic disease. Electronically Signed   By: Ellery Plunk M.D.   On: 09/29/2016 04:01   Dg Chest Port 1 View  Result Date: 10/03/2016 CLINICAL DATA:  Altered mental status, recurrent fevers. EXAM: PORTABLE CHEST 1 VIEW COMPARISON:  Chest x-ray dated September 28, 2016. FINDINGS: Interval placement of an enteric tube with the tip and distal side port in the gastric body. The cardiomediastinal silhouette is normal. Patchy densities at the left lung base behind the heart appear slightly improved. No pleural effusion or pneumothorax. No acute osseous abnormality. IMPRESSION: Improved patchy density in the left lung behind the heart, likely related to resolving pneumonia and/or aspiration. No new consolidation. Electronically Signed   By: Obie Dredge M.D.   On: 10/03/2016 08:30   Dg Chest Port 1 View  Result Date: 09/29/2016 CLINICAL DATA:  Seizure activity.  Fever. EXAM: PORTABLE CHEST 1 VIEW COMPARISON:  11/19/2013 FINDINGS: Shallow inspiration. Elevation of the left hemidiaphragm. Consolidation or atelectasis in the left lung base behind the heart. This could indicate pneumonia. Blunting of the left costophrenic angle suggests a small pleural effusion. No pneumothorax. Normal  heart size and pulmonary vascularity. IMPRESSION: Shallow inspiration with elevation of the left hemidiaphragm. Consolidation and effusion in the left base may represent pneumonia. Electronically Signed   By: Burman Nieves M.D.   On: 09/29/2016 01:14   Dg Abd Portable 1v  Result Date: 10/06/2016 CLINICAL DATA:  Nasogastric tube placement. EXAM: PORTABLE ABDOMEN - 1 VIEW COMPARISON:  10/04/2016 FINDINGS: Nasogastric tube is seen with tip overlying the body of the stomach. No evidence of dilated bowel loops. IMPRESSION: Nasogastric tube tip overlies body of stomach. Electronically Signed   By: Myles Rosenthal M.D.   On: 10/06/2016 09:20   Dg Abd Portable 1v  Result Date: 10/04/2016 CLINICAL DATA:  Dysphagia, confirmed NG tube placement. EXAM: PORTABLE ABDOMEN - 1 VIEW COMPARISON:  Abdominal x-ray dated 10/02/2016. FINDINGS: Enteric tube is incompletely imaged at the upper portion of this exam, presumably in the stomach but of uncertain positioning. Bowel gas pattern appears stable. IMPRESSION: Enteric tube is not adequately imaged, only the tip visible at the upper portion of the provided images, tip likely in the stomach but positioning cannot be more definitively characterized. Consider repeat plain film centered at the lung bases. Electronically Signed   By: Bary Richard M.D.   On: 10/04/2016 21:55   Dg Abd Portable 1v  Result Date: 10/02/2016 CLINICAL DATA:  Nasogastric tube placement. Cerebral palsy. Hiatal hernia. EXAM: PORTABLE ABDOMEN - 1 VIEW COMPARISON:  09/27/2015. Portable chest dated 09/28/2016. Abdomen pelvis CT dated 08/26/2012. FINDINGS: Normal bowel gas pattern. Nasogastric tube tip in the proximal to mid stomach and side hole in the proximal stomach. Probable suprapubic bladder catheter. Mild to moderate dextroconvex thoracolumbar scoliosis. Mild T12 and T9 vertebral compression deformities with no visible acute fracture lines, not present on 08/26/2012 and unchanged compared to 09/28/2016.  There is also a mild L4 vertebral compression deformity without significant change since 08/26/2012. Diffuse osteopenia. IMPRESSION: 1. No acute abnormality. 2. Mild, old L4 vertebral compression deformity and probable old mild T9 and T12 vertebral compression deformities, new since 08/26/2012. Electronically Signed   By: Beckie Salts M.D.   On: 10/02/2016 19:46    Microbiology: Recent Results (from the past 240 hour(s))  Culture, blood (routine x 2)  Status: None   Collection Time: 10/03/16  8:06 AM  Result Value Ref Range Status   Specimen Description BLOOD LEFT ARM  Final   Special Requests IN PEDIATRIC BOTTLE Blood Culture adequate volume  Final   Culture   Final    NO GROWTH 5 DAYS Performed at [facility], [address], [place], [place] [address]    Report Status 10/08/2016 FINAL  Final  Culture, blood (routine x 2)     Status: None   Collection Time: 10/03/16  8:06 AM  Result Value Ref Range Status   Specimen Description BLOOD LEFT ARM  Final   Special Requests IN PEDIATRIC BOTTLE Blood Culture adequate volume  Final   Culture   Final    NO GROWTH 5 DAYS Performed at [facility], [address] [place], [place] [address]    Report Status 10/08/2016 FINAL  Final     Labs: Basic Metabolic Panel:  Recent Labs Lab 10/05/16 0558 10/06/16 0514 10/07/16 0553 10/09/16 0317 10/10/16 0506  NA 143 141 143 142 144  K 3.9 3.5 3.7 3.4* 3.7  CL 105 103 109 108 109  CO2 27 30 27 26 24   GLUCOSE 71 153* 89 98 85  BUN 11 12 8  <5* <5*  CREATININE 0.32* <0.30* <0.30* 0.32* 0.41*  CALCIUM 8.8* 8.7* 8.3* 8.7* 8.7*  MG  --   --  2.1  --  2.2   Liver Function Tests:  Recent Labs Lab 10/06/16 0514 10/09/16 0317  AST 13* 28  ALT 14 37  ALKPHOS 39 44  BILITOT 0.6 0.5  PROT 4.9* 4.6*  ALBUMIN 2.9* 2.7*   No results for input(s): LIPASE, AMYLASE in the last 168 hours.  Recent Labs Lab 10/06/16 0514  AMMONIA 26   CBC:  Recent Labs Lab 10/06/16 0514  10/09/16 0317 10/10/16 0506  WBC 6.1 3.9* 4.3  HGB 12.7 12.2 12.8  HCT 37.8 37.8 39.5  MCV 90.6 93.8 95.4  PLT 143* 147* 170   Cardiac Enzymes: No results for input(s): CKTOTAL, CKMB, CKMBINDEX, TROPONINI in the last 168 hours. BNP: BNP (last 3 results) No results for input(s): BNP in the last 8760 hours.  ProBNP (last 3 results) No results for input(s): PROBNP in the last 8760 hours.  CBG:  Recent Labs Lab 10/09/16 2013 10/09/16 2347 10/10/16 0320 10/10/16 0743 10/10/16 1208  GLUCAP 106* 108* 92 80 95       Signed:  Aizen Duval MD, PhD  [facility] 10/10/2016, 4:10 PM

## 2016-10-10 NOTE — Progress Notes (Signed)
Daily Progress Note   Patient Name: Kristi Perry       Date: 10/10/2016 DOB: [birth year]  Age: 51 y.o. MRN#: [id number] Attending Physician: Albertine Grates, MD Primary Care Physician: Angela Cox, MD Admit Date: 09/28/2016  Reason for Consultation/Follow-up: Establishing goals of care  Subjective:  patient is sitting up in bed, she is awake, responds appropriately Wants lots of blankets in her room  See below:   Length of Stay: 11  Current Medications: Scheduled Meds:  . arformoterol  15 mcg Nebulization BID  . chlorhexidine  15 mL Mouth Rinse BID  . clonazePAM  0.5 mg Oral QPC breakfast  . enoxaparin (LOVENOX) injection  40 mg Subcutaneous Q24H  . fentaNYL  25 mcg Transdermal Q72H  . lamoTRIgine  100 mg Oral BID  . mouth rinse  15 mL Mouth Rinse q12n4p    Continuous Infusions: . dextrose 5 % and 0.9% NaCl 75 mL/hr at 10/09/16 0232  . levETIRAcetam Stopped (10/10/16 0631)  . valproate sodium Stopped (10/10/16 0717)    PRN Meds: acetaminophen, haloperidol lactate, labetalol, LORazepam, LORazepam, RESOURCE THICKENUP CLEAR  Physical Exam         Awake, follows commands Regular work of breathing No distress noted S1 S2 Abdomen soft No edema  Vital Signs: BP 121/68   Pulse 63   Temp 98.5 F (36.9 C) (Oral)   Resp (!) 22   Ht 4\' 9"  (1.448 m)   Wt 78.7 kg (173 lb 8 oz)   LMP  (LMP Unknown) Comment: pt unresponsive   SpO2 94%   BMI 37.55 kg/m  SpO2: SpO2: 94 % O2 Device: O2 Device: Not Delivered O2 Flow Rate: O2 Flow Rate (L/min): 4 L/min  Intake/output summary:   Intake/Output Summary (Last 24 hours) at 10/10/16 0932 Last data filed at 10/10/16 0717  Gross per 24 hour  Intake             2415 ml  Output                0 ml  Net             2415 ml   LBM:  Last BM Date: 10/09/16 Baseline Weight: Weight: 81.6 kg (180 lb) Most recent weight: Weight: 78.7 kg (173 lb 8 oz)  Palliative Assessment/Data:    Flowsheet Rows     Most Recent Value  Intake Tab  Referral Department  Hospitalist  Unit at Time of Referral  Intermediate Care Unit  Palliative Care Primary Diagnosis  Other (Comment) [seizure, sepsis, pna, CP]  Palliative Care Type  New Palliative care  Reason for referral  Clarify Goals of Care  Date first seen by Palliative Care  09/29/16  Clinical Assessment  Palliative Performance Scale Score  10%  Pain Max last 24 hours  2  Pain Min Last 24 hours  1  Psychosocial & Spiritual Assessment  Palliative Care Outcomes  Patient/Family meeting held?  No  Palliative Care Outcomes  Other (Comment) [call placed, unable to reach mother ]      Patient Active Problem List   Diagnosis Date Noted  . Encounter for palliative care   . Goals of care, counseling/discussion   . Feeding difficulty   . Aspiration pneumonia of left lower lobe (HCC) 10/05/2016  . Dysphagia   . Pressure injury of skin 10/02/2016  . Post-ictal coma (HCC) 09/29/2016  . Sepsis (HCC) 11/20/2013  . Unspecified hypothyroidism 11/20/2013  . Seizure (HCC) 11/20/2013  . Aspiration pneumonia (HCC) 11/20/2013  . UTI (urinary tract infection) 11/20/2013  . Acute respiratory failure with hypoxia (HCC) 11/19/2013  . Infection due to ESBL-producing Escherichia coli 04/07/2012  . Thyroid nodule 04/07/2012  . Nephrolithiasis 04/05/2012  . Acute lower UTI 04/03/2012  . HCAP (healthcare-associated pneumonia) 02/27/2012  . Cerebral palsy (HCC) 02/27/2012    Palliative Care Assessment & Plan   Patient Profile:    Assessment:  sepsis Asp pna Seizures Acute metabolic encephalopathy Underlying CP, bed bound at baseline, functional quadriplegia.    Recommendations/Plan: Recommend SNF with palliative on d/c Will need to monitor trajectory at SNF for the patient  might need full hospice support in the future.    Appreciate neuro input, AEDs are being carefully titrated, patient has had improvement in her mental status Monitor oral intake     Code Status:    Code Status Orders        Start     Ordered   09/29/16 0258  Do not attempt resuscitation (DNR)  Continuous    Question Answer Comment  In the event of cardiac or respiratory ARREST Do not call a "code blue"   In the event of cardiac or respiratory ARREST Do not perform Intubation, CPR, defibrillation or ACLS   In the event of cardiac or respiratory ARREST Use medication by any route, position, wound care, and other measures to relive pain and suffering. May use oxygen, suction and manual treatment of airway obstruction as needed for comfort.      09/29/16 0305    Code Status History    Date Active Date Inactive Code Status Order ID Comments User Context   11/20/2013  1:13 AM 11/23/2013  4:06 PM DNR [id number]  Haydee Monica, MD ED   04/03/2012  9:06 AM 04/07/2012  4:13 PM Full Code [id number]  Orlando Penner, RN Inpatient   02/28/2012  2:00 AM 03/02/2012  7:25 PM DNR [id number]  Rolan Lipa, NP Inpatient   02/27/2012  6:14 PM 02/28/2012  2:00 AM Full Code [id number]  Zerita Boers, RN Inpatient    Advance Directive Documentation     Most Recent Value  Type of Advance Directive  Out of facility DNR (pink MOST or yellow form)  Pre-existing out of facility DNR order (yellow form or pink MOST form)  Yellow form placed in chart (order not valid for inpatient use)  "MOST" Form in Place?  -       Prognosis:  guarded, remains at risk for further aspiration events with underlying seizures.   Discharge Planning:  Recommend SNF with palliative on d/c. Mother wants full comfort care/residential hospice in the future, should the patient have another acute decompensation for any reason.   Care plan was discussed with Dr Roda ShuttersXu.   Thank you for allowing the Palliative Medicine  Team to assist in the care of this patient.   Time In: 9 Time Out: 9.25 Total Time 25 Prolonged Time Billed  no       Greater than 50%  of this time was spent counseling and coordinating care related to the above assessment and plan.  Rosalin HawkingZeba Lewanna Petrak, MD [phone]   Please contact Palliative Medicine Team phone at [phone] for questions and concerns.

## 2016-10-10 NOTE — Clinical Social Work Placement (Signed)
   CLINICAL SOCIAL WORK PLACEMENT  NOTE  Date:  10/10/2016  Patient Details  Name: Kristi Perry MRN: [id number] Date of Birth: 06/26/1965  Clinical Social Work is seeking post-discharge placement for this patient at the Skilled  Nursing Facility level of care (*CSW will initial, date and re-position this form in  chart as items are completed):      Patient/family provided with [facility] Clinical Social Work Department's list of facilities offering this level of care within the geographic area requested by the patient (or if unable, by the patient's family).  Yes   Patient/family informed of their freedom to choose among providers that offer the needed level of care, that participate in Medicare, Medicaid or managed care program needed by the patient, have an available bed and are willing to accept the patient.      Patient/family informed of [facility]'s ownership interest in [facility] and [facility], as well as of the fact that they are under no obligation to receive care at these facilities.  PASRR submitted to EDS on       PASRR number received on 09/30/16     Existing PASRR number confirmed on       FL2 transmitted to all facilities in geographic area requested by pt/family on 09/30/16     FL2 transmitted to all facilities within larger geographic area on       Patient informed that his/her managed care company has contracts with or will negotiate with certain facilities, including the following:        Yes   Patient/family informed of bed offers received.  Patient [facility] bed at [facility]     Physician recommends and patient chooses bed at      Patient to be transferred to [facility] on 10/10/16.  Patient to be transferred to facility by [facility]     Patient family notified on 10/10/16 of transfer.  Name of family member notified:  [name]     PHYSICIAN Please prepare priority discharge summary, including medications, Please prepare  prescriptions, Please sign DNR     Additional Comment:    _______________________________________________ Maree KrabbeBridget A Lateisha Thurlow, LCSW 10/10/2016, 2:55 PM

## 2016-10-10 NOTE — Plan of Care (Signed)
Problem: Health Behavior/Discharge Planning: Goal: Ability to manage health-related needs will improve Outcome: Progressing Family working to make decision for placement.   Problem: Physical Regulation: Goal: Ability to maintain clinical measurements within normal limits will improve Outcome: Completed/Met Date Met: 10/10/16 No seizure activity- patient back to baseline.

## 2016-10-10 NOTE — Progress Notes (Addendum)
Subjective: Patient is alert she is complaining of abdominal pain and headache. She is asking the physical therapist why she has not had her breakfast. She has requested that somebody watch her eat due to the fact that she is in pain and her mother usually watches her eat when she is in pain. She is able to follow all commands are asked of her.  Exam: Vitals:   10/10/16 0900 10/10/16 0907  BP: 121/68   Pulse:    Resp: 14 (!) 22  Temp:    SpO2:      HEENT-  Normocephalic, no lesions, without obvious abnormality.  Normal external eye and conjunctiva.  Normal TM's bilaterally.  Normal auditory canals and external ears. Normal external nose, mucus membranes and septum.  Normal pharynx.    Neuro: MS: Awake, tells me her name, follows commands, and converses by telling me that she is in discomfort with headache and stomachache. She is also conversive enough to tell me that she wants him to sit with her what she eats.  ZO:XWRUCN:eyes dysconjugate, Fixates and tracks, able to count fingers.  Motor: she moves both arms to command and squeezes my fingers when asked, does not move LE, spastic.   Pertinent Labs/Diagnostics: VPA 65 Lamictal level pending  Felicie MornDavid Hadiya Spoerl PA-C [facility] [phone]  Impression: 51 yo F with CP and encephalopathy, presented with recurrent seizures without return to baseline. Palliative care has been involved and appreciate their help. [facility] with concern for continued intermittent seizures. Currently much improved   Recommendations:  1) Continue lamictal 100mg  BID 2) keppra 500mg  BID 3) continue valproate 500mg  TID(increased from home dose) 4) lamictal level pending   Neurology S/O    10/10/2016, 9:14 AM

## 2016-10-10 NOTE — Clinical Social Work Note (Signed)
Clinical Social Worker facilitated patient discharge including contacting patient family and facility to confirm patient discharge plans.  Clinical information faxed to facility and family agreeable with plan.  CSW arranged ambulance transport via [facility] to [facility] .  RN to call [phone] for report prior to discharge.  Clinical Social Worker will sign off for now as social work intervention is no longer needed. Please consult us again if new need arises.  Chasady Longwell, LCSWA [phone]   

## 2016-10-14 LAB — LAMOTRIGINE LEVEL: Lamotrigine Lvl: 8.3 ug/mL (ref 2.0–20.0)

## 2018-03-11 ENCOUNTER — Non-Acute Institutional Stay: Payer: Medicare Other | Admitting: Nurse Practitioner

## 2018-03-11 ENCOUNTER — Encounter: Payer: Self-pay | Admitting: Nurse Practitioner

## 2018-03-11 VITALS — BP 98/59 | HR 78 | Temp 98.1°F | Resp 22 | Ht <= 58 in | Wt 136.1 lb

## 2018-03-11 DIAGNOSIS — Z515 Encounter for palliative care: Secondary | ICD-10-CM | POA: Insufficient documentation

## 2018-03-11 DIAGNOSIS — R5381 Other malaise: Secondary | ICD-10-CM | POA: Insufficient documentation

## 2018-03-11 DIAGNOSIS — R630 Anorexia: Secondary | ICD-10-CM

## 2018-03-11 NOTE — Progress Notes (Signed)
[facility] Telephone: [phone] Fax: [phone]  PATIENT NAME: Kristi Perry DOB: 1965-10-23 MRN: [id number]  PRIMARY CARE PROVIDER:  Dr Jamas Lav PROVIDER: Dr [facility] RESPONSIBLE PARTY:   Mehar Pellissier mother [phone] or [phone]  ASSESSMENT:     I visited and observed Kristi Kristi Perry. She does appear debilitated, chronically ill but comfortable. She did open her eyes and make eye contact a verbal cues. Asked if she was having pain or shortness of breath and she mumbled. She quickly returned to sleeping. She was cooperative with assessment. No meaningful discussion due to cognitive impairment. Emotional support provided. Kristi [name] has had a weight gain since last palliative medicine visit in wound appears to be stable. Will continue to monitor and follow with palliative care with next visit in two months if needed or sooner should she did fine. I have attempted to contact this [name]'s mother, Kristi Perry, message left to return call. DNR /do not intubate/do not hospitalize, no feeding tube, no lab test, no surgical intervention but wishes are for antibiotics, IV fluids  PC 2 / 4 / 2019 PC 8 / 13 / 2018 to 8 / 14 / 2018 Hospice 8/ 15 / 2018 to 1/17 / 2018  9 / 5 / 2018 weight 134.7 lbs 10 / 16 / 2019 weight 134.0 lbs 1 / 13 / 2020 weight 136.1 lbs  RECOMMENDATIONS and PLAN:   1.Palliative care encounter Z51.5; Palliative medicine team will continue to support patient, patient's family, and medical team. Visit consisted of counseling and education dealing with the complex and emotionally intense issues of symptom management and palliative care in the setting of serious and potentially life-threatening illness/ DNR /do not intubate/do not hospitalize, no feeding tube, no lab test, no surgical intervention but wishes are for antibiotics, IV fluids  2. Debility R53.81 secondary to cerebal palsy progressive, encourage passive rom; encourage to  transfer to geri-chair.   3. Anorexia R63.0 secondary to  appetite remaining declined despite recent weight gain. Continue to encourage supplements and comfort feedings. Discuss nutrition with nursing staff; continue comfort feedings; aspiration precautions  I spent 45 minutes providing this consultation,  from 11:15am to 12:00pm. More than 50% of the time in this consultation was spent coordinating communication.   HISTORY OF PRESENT ILLNESS:  Kristi Perry is a 53 y.o. year old female with multiple medical problems including Cerebal palsy, chronic respiratory failure, congestive heart failure, COPD, dysphagia, epilepsy, history of DVT, hypothyroidism, hypertension, hyperlipidemia, chronic pain syndrome, hiatal hernia, migraine headaches, intellectual disability, nephrolithiasis, obesity, neurogenic bladder, anxiety was psicosis, bipolar, schizoaffective disorder,  depression, vitamin D deficiency. She is on a fentanyl patch 50 MCG Q 72 hours as needed for pain in addition to Tylenol 650 q 4 hours as needed for pain. Kristi Perry continues to reside in skilled [facility]Term [facility] Nursing Facility. She does remain bed-bound, total ADL dependence with incontinence. She does require assistance with feeding and appetite has improved with some weight gain. Last primary provider note 11 / 22 / 2019 for bipolar disorder labile, screaming at staff. Continued Risperdal 3 mg tid, clonazepam 0.5 mg QAM oh, clonazepam 0.25 mg qhs, Depakote 500 mg Q 8 hours, melatonin 3 mg qhs and refer to psychiatry. Staff and door says Kristi [name] has been sleeping more and behaviors have improved. She is cognitively impaired but at times able to verbalize her needs. At present she is lying in bed, appears to billete did, chronically ill but comfortable. No visitors present .  Palliative Care was asked to help address goals of care.   CODE STATUS: 30%  PPS: 30% HOSPICE ELIGIBILITY/DIAGNOSIS: TBD  PAST MEDICAL HISTORY:  Past Medical History:   Diagnosis Date  . Acute respiratory failure (HCC)   . Allergic rhinitis   . Anxiety   . Cerebral palsy (HCC)   . Chronic airway obstruction (HCC)   . Chronic pain syndrome   . Depression   . DVT (deep vein thrombosis) in pregnancy   . Dysphagia   . Dysphasia   . Epilepsy (HCC)   . Hiatal hernia   . Hyperlipidemia   . Hypertension   . Hypothyroidism   . Incontinent of feces 09-25-11   hx.  . Incontinent of urine 09-25-11   hx.  . Migraine   . Mild intellectual disabilities   . Morbid obesity (HCC) 09-25-11   requires Hoyer lift. pt. doesn't stand or ambulate.  . Muscle weakness   . Nephrolithiasis 04/05/2012  . Neurogenic bladder   . Pneumonia    hx of asp pneumonia 1/11-1/19/12  . Sacral decubitus ulcer    hx of  . Seizures (HCC)   . Sepsis(995.91)   . Sepsis(995.91)    hx of   . Unspecified psychosis   . Vitamin D deficiency     SOCIAL HX:  Social History   Tobacco Use  . Smoking status: Never Smoker  . Smokeless tobacco: Never Used  Substance Use Topics  . Alcohol use: No    ALLERGIES:  Allergies  Allergen Reactions  . Sulfa Antibiotics Hives  . Tape Rash    Paper tape     PERTINENT MEDICATIONS:  Outpatient Encounter Medications as of 03/11/2018  Medication Sig  . acetaminophen (TYLENOL) 325 MG tablet Take 650 mg by mouth every 4 (four) hours as needed for mild pain or moderate pain.  Marland Kitchen albuterol (PROVENTIL HFA;VENTOLIN HFA) 108 (90 BASE) MCG/ACT inhaler Inhale 2 puffs into the lungs every 4 (four) hours as needed for wheezing or shortness of breath.  Marland Kitchen albuterol (PROVENTIL) (2.5 MG/3ML) 0.083% nebulizer solution Take 2.5 mg by nebulization every 8 (eight) hours as needed for wheezing or shortness of breath.  Marland Kitchen antiseptic oral rinse (BIOTENE) LIQD 15 mLs by Mouth Rinse route as needed for dry mouth.  Marland Kitchen arformoterol (BROVANA) 15 MCG/2ML NEBU Take 15 mcg by nebulization 2 (two) times daily.  . budesonide (PULMICORT) 0.25 MG/2ML nebulizer solution Take 0.25 mg  by nebulization 2 (two) times daily as needed (SOB, wheezing).   . clonazePAM (KLONOPIN) 0.5 MG tablet Take 1 tablet (0.5 mg total) by mouth daily after breakfast.  . clonazePAM (KLONOPIN) 0.5 MG tablet Take 1 tablet (0.5 mg total) by mouth 2 (two) times daily as needed for anxiety.  . fentaNYL (DURAGESIC) 50 MCG/HR Place 1 patch (50 mcg total) onto the skin every 3 (three) days.  . fexofenadine-pseudoephedrine (ALLEGRA-D) 60-120 MG 12 hr tablet Take 1 tablet by mouth daily.  . fluticasone (FLONASE) 50 MCG/ACT nasal spray Place 2 sprays into both nostrils daily.  . furosemide (LASIX) 20 MG tablet Take 1 tablet (20 mg total) by mouth every Monday, Wednesday, and Friday.  Marland Kitchen ketotifen (ZADITOR) 0.025 % ophthalmic solution Place 1 drop into both eyes 2 (two) times daily.  Marland Kitchen lamoTRIgine (LAMICTAL) 100 MG tablet Take 100 mg by mouth 2 (two) times daily.   Marland Kitchen levETIRAcetam (KEPPRA) 100 MG/ML solution Take 5 mLs (500 mg total) by mouth 2 (two) times daily.  . Maltodextrin-Xanthan Gum (RESOURCE THICKENUP CLEAR) POWD For  nectar thick liquid  . Melatonin-Pyridoxine (MELATIN PO) Take 3 mg by mouth at bedtime.   . montelukast (SINGULAIR) 10 MG tablet Take 10 mg by mouth at bedtime.   . potassium chloride (K-DUR) 10 MEQ tablet Take 1 tablet (10 mEq total) by mouth daily.  . pravastatin (PRAVACHOL) 40 MG tablet Take 40 mg by mouth daily.   . sodium chloride (OCEAN) 0.65 % nasal spray Place 1 spray into the nose 2 (two) times daily.  . Valproate Sodium (DEPAKENE) 250 MG/5ML SOLN solution Take 10 mLs (500 mg total) by mouth every 8 (eight) hours.  . vitamin C (ASCORBIC ACID) 500 MG tablet Take 500 mg by mouth 2 (two) times daily.  . Vitamin D, Ergocalciferol, (DRISDOL) 50000 UNITS CAPS capsule Take 50,000 Units by mouth every 7 (seven) days.   No facility-administered encounter medications on file as of 03/11/2018.     PHYSICAL EXAM:   General: NAD, severely debilitated, chronically ill, confused  female Cardiovascular: regular rate and rhythm Pulmonary: clear ant fields Abdomen: soft, nontender, + bowel sounds GU: no suprapubic tenderness Extremities: no edema, no joint deformities; contracted Skin: no rashes/Sacral wound pressure stage 2 measures 1.0 x 1.8 cm x 1.4 cm Neurological: Weakness but otherwise nonfocal/fucntional quadripleigc  Akili Corsetti Z Caly Pellum, NP

## 2018-03-13 ENCOUNTER — Telehealth: Payer: Self-pay | Admitting: Nurse Practitioner

## 2018-03-13 NOTE — Telephone Encounter (Signed)
I called Kerry Fort, Ms. Witherington's mother, message left to return call for update on PC visit

## 2018-04-28 ENCOUNTER — Non-Acute Institutional Stay: Payer: Medicare Other | Admitting: Nurse Practitioner

## 2018-04-28 ENCOUNTER — Encounter: Payer: Self-pay | Admitting: Nurse Practitioner

## 2018-04-28 VITALS — BP 118/74 | HR 98 | Temp 98.3°F | Resp 22 | Ht <= 58 in | Wt 128.5 lb

## 2018-04-28 DIAGNOSIS — R630 Anorexia: Secondary | ICD-10-CM

## 2018-04-28 DIAGNOSIS — R5381 Other malaise: Secondary | ICD-10-CM

## 2018-04-28 DIAGNOSIS — Z515 Encounter for palliative care: Secondary | ICD-10-CM

## 2018-04-28 NOTE — Progress Notes (Signed)
Therapist, nutritional Palliative Care Consult Note Telephone: [phone]  Fax: [phone]  PATIENT NAME: Gracynn Rajewski Goodwine DOB: 31-Dec-1965 MRN: [id number]  PRIMARY CARE PROVIDER:   Dr Jamas Lav PROVIDER:  Dr [facility] RESPONSIBLE PARTY:    Hether Anselmo mother [phone] or [phone]  RECOMMENDATIONS and PLAN:  1.Palliative care encounter Z51.5; Palliative medicine team will continue to support patient, patient's family, and medical team. Visit consisted of counseling and education dealing with the complex and emotionally intense issues of symptom management and palliative care in the setting of serious and potentially life-threatening illness/  Medical goals of care to include DNR, do not hospitalize, do not intubate, no blood transfusion, no diagnostic testing, new feeding tube, no lab work but wishes are for antibiotics, IV hydration if necessary and wishes are for use of end-of-life comfort medications  2. Debility R53.81 secondary to cerebal palsy progressive, encourage passive rom; encourage to transfer to geri-chair.   3. Anorexia R63.0 secondary to  appetite remaining declined despite recent weight gain. Continue to encourage supplements and comfort feedings. Discuss nutrition with nursing staff; continue comfort feedings; aspiration precautions  ASSESSMENT:    I visit and observed Ms Faircloth. We talked about purpose for palliative care visit. She did make eye contact and mumbled words that were difficult to understand. She became tearful but was cooperative with assessment. Emotional support provided. No meaningful discussion due to cognitive impairment. We'll continue to monitor weight says she's continues to slowly lose weight. She was previously under [facility] but discharged due to stability may revisit if she continues to lose weight and albumin trends down but at present time albumin is stable. I have attempted to contact  her mother, unable to leave a message but will continue. Updated nursing staff many changes to current goals are plan of care.  BMI 27.8 11 / 6 / 2018 weight 132 lbs 1 / 15 / 2020 weight 135.3 lbs 2 / 12 / 2020 weight 128.5 lbs  2 / 21 / 2020 albumin 3.6, total protein 5.3    I spent 45 minutes providing this consultation,  from 1:00pm to 1:45pm. More than 50% of the time in this consultation was spent coordinating communication.   HISTORY OF PRESENT ILLNESS:  Glendale Wherry Colgan is a 53 y.o. year old female with multiple medical problems including Cerebal palsy, chronic respiratory failure, congestive heart failure, COPD, dysphagia, epilepsy, history of DVT, hypothyroidism, hypertension, hyperlipidemia, chronic pain syndrome, hiatal hernia, migraine headaches, intellectual disability, nephrolithiasis, obesity, neurogenic bladder, anxiety was psicosis, bipolar, schizoaffective disorder,depression, vitamin D deficiency.Continues to reset it still [facility] Facility. She remains bed bound, total ADL dependence with incontinence bowel and bladder. She does require to be fed with declining appetite and 7 pound weight loss over the last 4 weeks. Albumin has been stable at 3.6. Last primary provider no 1 / 28 / 2020 for bipolar disorder and non-compliance. She does refuse her medications in food, and resistant to care. She is oriented to self, at times able to verbalize her needs. She does yell out at staff intermittently. She is followed by Psychiatry at the facility with last date of service 2 / 20 / 2020 for which she takes her spirit off for sad, Lamictal for mood, clonazepam for anxiety and fentanyl patch for chronic pain in addition to depakene and Keppra for seizure disorder. No medication changes at this visit. She is also followed by wound care at the facility for her pressure  sacral wound measuring 0.5 x 0.5 x 0.1. Palliative Care was asked to help address goals of care.  At present she is  lying in bed, appears chronically ill, debilitated. No visitors present.   CODE STATUS: DNR  PPS: 30% HOSPICE ELIGIBILITY/DIAGNOSIS: TBD  PAST MEDICAL HISTORY:  Past Medical History:  Diagnosis Date  . Acute respiratory failure (HCC)   . Allergic rhinitis   . Anxiety   . Cerebral palsy (HCC)   . Chronic airway obstruction (HCC)   . Chronic pain syndrome   . Depression   . DVT (deep vein thrombosis) in pregnancy   . Dysphagia   . Dysphasia   . Epilepsy (HCC)   . Hiatal hernia   . Hyperlipidemia   . Hypertension   . Hypothyroidism   . Incontinent of feces 09-25-11   hx.  . Incontinent of urine 09-25-11   hx.  . Migraine   . Mild intellectual disabilities   . Morbid obesity (HCC) 09-25-11   requires Hoyer lift. pt. doesn't stand or ambulate.  . Muscle weakness   . Nephrolithiasis 04/05/2012  . Neurogenic bladder   . Pneumonia    hx of asp pneumonia 1/11-1/19/12  . Sacral decubitus ulcer    hx of  . Seizures (HCC)   . Sepsis(995.91)   . Sepsis(995.91)    hx of   . Unspecified psychosis   . Vitamin D deficiency     SOCIAL HX:  Social History   Tobacco Use  . Smoking status: Never Smoker  . Smokeless tobacco: Never Used  Substance Use Topics  . Alcohol use: No    ALLERGIES:  Allergies  Allergen Reactions  . Sulfa Antibiotics Hives  . Tape Rash    Paper tape     PERTINENT MEDICATIONS:  Outpatient Encounter Medications as of 04/28/2018  Medication Sig  . acetaminophen (TYLENOL) 325 MG tablet Take 650 mg by mouth every 4 (four) hours as needed for mild pain or moderate pain.  Marland Kitchen albuterol (PROVENTIL HFA;VENTOLIN HFA) 108 (90 BASE) MCG/ACT inhaler Inhale 2 puffs into the lungs every 4 (four) hours as needed for wheezing or shortness of breath.  Marland Kitchen albuterol (PROVENTIL) (2.5 MG/3ML) 0.083% nebulizer solution Take 2.5 mg by nebulization every 8 (eight) hours as needed for wheezing or shortness of breath.  Marland Kitchen antiseptic oral rinse (BIOTENE) LIQD 15 mLs by Mouth Rinse  route as needed for dry mouth.  Marland Kitchen arformoterol (BROVANA) 15 MCG/2ML NEBU Take 15 mcg by nebulization 2 (two) times daily.  . budesonide (PULMICORT) 0.25 MG/2ML nebulizer solution Take 0.25 mg by nebulization 2 (two) times daily as needed (SOB, wheezing).   . clonazePAM (KLONOPIN) 0.5 MG tablet Take 1 tablet (0.5 mg total) by mouth daily after breakfast.  . clonazePAM (KLONOPIN) 0.5 MG tablet Take 1 tablet (0.5 mg total) by mouth 2 (two) times daily as needed for anxiety.  . fentaNYL (DURAGESIC) 50 MCG/HR Place 1 patch (50 mcg total) onto the skin every 3 (three) days.  . fexofenadine-pseudoephedrine (ALLEGRA-D) 60-120 MG 12 hr tablet Take 1 tablet by mouth daily.  . fluticasone (FLONASE) 50 MCG/ACT nasal spray Place 2 sprays into both nostrils daily.  . furosemide (LASIX) 20 MG tablet Take 1 tablet (20 mg total) by mouth every Monday, Wednesday, and Friday.  Marland Kitchen ketotifen (ZADITOR) 0.025 % ophthalmic solution Place 1 drop into both eyes 2 (two) times daily.  Marland Kitchen lamoTRIgine (LAMICTAL) 100 MG tablet Take 100 mg by mouth 2 (two) times daily.   Marland Kitchen levETIRAcetam (KEPPRA) 100  MG/ML solution Take 5 mLs (500 mg total) by mouth 2 (two) times daily.  . Maltodextrin-Xanthan Gum (RESOURCE THICKENUP CLEAR) POWD For nectar thick liquid  . Melatonin-Pyridoxine (MELATIN PO) Take 3 mg by mouth at bedtime.   . montelukast (SINGULAIR) 10 MG tablet Take 10 mg by mouth at bedtime.   . potassium chloride (K-DUR) 10 MEQ tablet Take 1 tablet (10 mEq total) by mouth daily.  . pravastatin (PRAVACHOL) 40 MG tablet Take 40 mg by mouth daily.   . sodium chloride (OCEAN) 0.65 % nasal spray Place 1 spray into the nose 2 (two) times daily.  . Valproate Sodium (DEPAKENE) 250 MG/5ML SOLN solution Take 10 mLs (500 mg total) by mouth every 8 (eight) hours.  . vitamin C (ASCORBIC ACID) 500 MG tablet Take 500 mg by mouth 2 (two) times daily.  . Vitamin D, Ergocalciferol, (DRISDOL) 50000 UNITS CAPS capsule Take 50,000 Units by mouth every  7 (seven) days.   No facility-administered encounter medications on file as of 04/28/2018.     PHYSICAL EXAM:   General: debilitated chronically ill female, cognitively and functionally impaired Cardiovascular: regular rate and rhythm Pulmonary: clear ant fields Abdomen: soft, nontender, + bowel sounds GU: no suprapubic tenderness Extremities: no edema, no joint deformities Skin: no rashes Neurological: Weakness but otherwise nonfocal/functional quadriplegic  Christin Prince Rome, NP

## 2018-05-22 ENCOUNTER — Encounter: Payer: Self-pay | Admitting: Nurse Practitioner

## 2018-05-22 ENCOUNTER — Non-Acute Institutional Stay: Payer: Medicare Other | Admitting: Nurse Practitioner

## 2018-05-22 VITALS — BP 88/58 | HR 72 | Temp 97.4°F | Resp 18 | Ht <= 58 in | Wt 132.0 lb

## 2018-05-22 DIAGNOSIS — Z515 Encounter for palliative care: Secondary | ICD-10-CM

## 2018-05-22 DIAGNOSIS — R630 Anorexia: Secondary | ICD-10-CM | POA: Insufficient documentation

## 2018-05-22 DIAGNOSIS — R5381 Other malaise: Secondary | ICD-10-CM

## 2018-05-22 NOTE — Progress Notes (Signed)
Therapist, nutritional Palliative Care Consult Note Telephone: [phone]  Fax: [phone]  PATIENT NAME: Kristi Perry DOB: 03-10-1965 MRN: [id number]  PRIMARY CARE PROVIDER:   Dr Jamas Lav PROVIDER:  Dr [facility] RESPONSIBLE PARTY:   Joelene Giel mother [phone] or [phone]  RECOMMENDATIONS and PLAN:  1.Palliative care encounter Z51.5; Palliative medicine team will continue to support patient, patient's family, and medical team. Visit consisted of counseling and education dealing with the complex and emotionally intense issues of symptom management and palliative care in the setting of serious and potentially life-threatening illness/  Medical goals of care to include DNR, do not hospitalize, do not intubate, no blood transfusion, no diagnostic testing, new feeding tube, no lab work but wishes are for antibiotics, IV hydration if necessary and wishes are for use of end-of-life comfort medications  2.Debility R53.81 secondary tocerebal palsyprogressive, encourage passive rom; encourage to transfer to geri-chair.   3.Anorexia R63.0secondary toappetite remaining declineddespite recent weight gain. Continue to encourage supplements and comfort feedings. Discuss nutritionwith nursing staff; continue comfort feedings; aspiration precautions  ASSESSMENT:    I visited and observe Kristi Perry. Talked about purpose of palliative care visit. She did make eye contact with verbal cues. She was able to answer questions that were simple and direct. She answered no to questions about symptoms of pain and shortness of breath. We talked about her appetite. Limited verbal discussion due to cognitive impairment. Emotional support provided. She requested to be turned, turned repositioned. I have attempted to contact her mother for further discussion of goals of care as she does remain a DNR with focus on comfort. Will continue to Monitor and  follow with palliative care and any changes today to current goals or plan of care. I updated nursing staff.  11 / 6 / 2018 weight 132 lbs 1 / 15 / 2020 weight 135.3 lbs 2 / 12 / 2020 weight 128.5 lbs 3 / 11 / 2020 weight 132.0 lbs  2 / 21 / 2020 albumin 3.6, total protein 5.3  I spent 45 minutes providing this consultation,  from 1:00pm to 1:45pm. More than 50% of the time in this consultation was spent coordinating communication.   HISTORY OF PRESENT ILLNESS:  Kristi Perry is a 53 y.o. year old female with multiple medical problems including Cerebal palsy, chronic respiratory failure, congestive heart failure, COPD, dysphagia, epilepsy, history of DVT, hypothyroidism, hypertension, hyperlipidemia, chronic pain syndrome, hiatal hernia, migraine headaches, intellectual disability, nephrolithiasis, obesity, neurogenic bladder, anxiety was psicosis, bipolar, schizoaffective disorder,depression, vitamin D deficiency. Kristi Perry continues to reside in skilled Long-Term Care Nursing Facility. She remains totally functionally quadriplegic [facility] [place] continues to reside in skilled long-term care nursing facility. She remains incontinent bowel and bladder. She does require to be fed and appetite continues to be fair to poor. Last primary provider visit to / 26 / 2020 for peripheral vascular disease with no changes noted to plan of care. She is oriented to self but has episodes of yelling and screaming  with increased agitation intermittently. She is also followed by Psychotherapy in addition to Psychiatry. Last Psychiatry dataservice 3/5 / 2020 for Schizoaffective disorder, mild intellectual disability currently taking depakene, Keppra for seizures, Risperdal for history of sad, Lamictal for mood, clonazepam for anxiety, fentanyl patch for pain. No changes to medication regimen at this visit. At present she is lying in bed. She appears to debilitated, chronically ill but comfortable. Palliative Care was asked  to help address goals of  care.   CODE STATUS: DNR  PPS: 30% HOSPICE ELIGIBILITY/DIAGNOSIS: TBD  PAST MEDICAL HISTORY:  Past Medical History:  Diagnosis Date   Acute respiratory failure (HCC)    Allergic rhinitis    Anxiety    Cerebral palsy (HCC)    Chronic airway obstruction (HCC)    Chronic pain syndrome    Depression    DVT (deep vein thrombosis) in pregnancy    Dysphagia    Dysphasia    Epilepsy (HCC)    Hiatal hernia    Hyperlipidemia    Hypertension    Hypothyroidism    Incontinent of feces 09-25-11   hx.   Incontinent of urine 09-25-11   hx.   Migraine    Mild intellectual disabilities    Morbid obesity (HCC) 09-25-11   requires Hoyer lift. pt. doesn't stand or ambulate.   Muscle weakness    Nephrolithiasis 04/05/2012   Neurogenic bladder    Pneumonia    hx of asp pneumonia 1/11-1/19/12   Sacral decubitus ulcer    hx of   Seizures (HCC)    Sepsis(995.91)    Sepsis(995.91)    hx of    Unspecified psychosis    Vitamin D deficiency     SOCIAL HX:  Social History   Tobacco Use   Smoking status: Never Smoker   Smokeless tobacco: Never Used  Substance Use Topics   Alcohol use: No    ALLERGIES:  Allergies  Allergen Reactions   Sulfa Antibiotics Hives   Tape Rash    Paper tape     PERTINENT MEDICATIONS:  Outpatient Encounter Medications as of 05/22/2018  Medication Sig   acetaminophen (TYLENOL) 325 MG tablet Take 650 mg by mouth every 4 (four) hours as needed for mild pain or moderate pain.   albuterol (PROVENTIL HFA;VENTOLIN HFA) 108 (90 BASE) MCG/ACT inhaler Inhale 2 puffs into the lungs every 4 (four) hours as needed for wheezing or shortness of breath.   albuterol (PROVENTIL) (2.5 MG/3ML) 0.083% nebulizer solution Take 2.5 mg by nebulization every 8 (eight) hours as needed for wheezing or shortness of breath.   antiseptic oral rinse (BIOTENE) LIQD 15 mLs by Mouth Rinse route as needed for dry mouth.    arformoterol (BROVANA) 15 MCG/2ML NEBU Take 15 mcg by nebulization 2 (two) times daily.   budesonide (PULMICORT) 0.25 MG/2ML nebulizer solution Take 0.25 mg by nebulization 2 (two) times daily as needed (SOB, wheezing).    clonazePAM (KLONOPIN) 0.5 MG tablet Take 1 tablet (0.5 mg total) by mouth daily after breakfast.   clonazePAM (KLONOPIN) 0.5 MG tablet Take 1 tablet (0.5 mg total) by mouth 2 (two) times daily as needed for anxiety.   fentaNYL (DURAGESIC) 50 MCG/HR Place 1 patch (50 mcg total) onto the skin every 3 (three) days.   fexofenadine-pseudoephedrine (ALLEGRA-D) 60-120 MG 12 hr tablet Take 1 tablet by mouth daily.   fluticasone (FLONASE) 50 MCG/ACT nasal spray Place 2 sprays into both nostrils daily.   furosemide (LASIX) 20 MG tablet Take 1 tablet (20 mg total) by mouth every Monday, Wednesday, and Friday.   ketotifen (ZADITOR) 0.025 % ophthalmic solution Place 1 drop into both eyes 2 (two) times daily.   lamoTRIgine (LAMICTAL) 100 MG tablet Take 100 mg by mouth 2 (two) times daily.    levETIRAcetam (KEPPRA) 100 MG/ML solution Take 5 mLs (500 mg total) by mouth 2 (two) times daily.   Maltodextrin-Xanthan Gum (RESOURCE THICKENUP CLEAR) POWD For nectar thick liquid   Melatonin-Pyridoxine (MELATIN PO) Take 3  mg by mouth at bedtime.    montelukast (SINGULAIR) 10 MG tablet Take 10 mg by mouth at bedtime.    potassium chloride (K-DUR) 10 MEQ tablet Take 1 tablet (10 mEq total) by mouth daily.   pravastatin (PRAVACHOL) 40 MG tablet Take 40 mg by mouth daily.    sodium chloride (OCEAN) 0.65 % nasal spray Place 1 spray into the nose 2 (two) times daily.   Valproate Sodium (DEPAKENE) 250 MG/5ML SOLN solution Take 10 mLs (500 mg total) by mouth every 8 (eight) hours.   vitamin C (ASCORBIC ACID) 500 MG tablet Take 500 mg by mouth 2 (two) times daily.   Vitamin D, Ergocalciferol, (DRISDOL) 50000 UNITS CAPS capsule Take 50,000 Units by mouth every 7 (seven) days.   No  facility-administered encounter medications on file as of 05/22/2018.     PHYSICAL EXAM:   General: NAD, debilitated, chronically ill confused female Cardiovascular: regular rate and rhythm Pulmonary: clear ant fields Abdomen: soft, nontender, + bowel sounds GU: no suprapubic tenderness Extremities: no edema, no joint deformities/contractures Skin: no rashes/3 / 16 / 2020 wound care continues to follow for sacral ulcer 1.1 x 0.6 x 0.1 with serous sanguineous moderate amount of drainage. Neurological: Weakness but otherwise nonfocal/functionally quadriplegic  Lakyia Behe Prince [place], NP

## 2018-05-25 ENCOUNTER — Other Ambulatory Visit: Payer: Self-pay

## 2018-07-01 ENCOUNTER — Encounter: Payer: Self-pay | Admitting: Nurse Practitioner

## 2018-07-01 ENCOUNTER — Non-Acute Institutional Stay: Payer: Medicare Other | Admitting: Nurse Practitioner

## 2018-07-01 VITALS — BP 124/70 | HR 88 | Temp 98.1°F | Resp 18 | Ht <= 58 in | Wt 127.0 lb

## 2018-07-01 DIAGNOSIS — R5381 Other malaise: Secondary | ICD-10-CM

## 2018-07-01 DIAGNOSIS — Z515 Encounter for palliative care: Secondary | ICD-10-CM

## 2018-07-01 DIAGNOSIS — R630 Anorexia: Secondary | ICD-10-CM

## 2018-07-01 NOTE — Progress Notes (Signed)
Therapist, nutritional Palliative Care Consult Note Telephone: [phone]  Fax: [phone]  PATIENT NAME: Kristi Perry DOB: [birth year] MRN: [id number]  PRIMARY CARE PROVIDER:   Dr Jamas Lav PROVIDER:  Dr [facility] RESPONSIBLE PARTY:   Latrisa Onsurez brother [phone] or [phone]  RECOMMENDATIONS and PLAN:  1.Palliative care encounter Z51.5; Palliative medicine team will continue to support patient, patient's family, and medical team. Visit consisted of counseling and education dealing with the complex and emotionally intense issues of symptom management and palliative care in the setting of serious and potentially life-threatening illness/  Medical goals of care to include DNR, do not intubate, do not hospitalize, no blood transfusion, no diagnostic testing, new feeding tube, no lab testing common Eye Surgical procedures but wishes are for antibiotic therapy, IV fluids, use of end-of-life medications for comfort.  2.Debility R53.81 secondary tocerebal palsyprogressive, encourage passive rom; encourage to transfer to geri-chair.   3.Anorexia R63.0secondary toappetite remaining declineddespite recent weight gain. Continue to encourage supplements and comfort feedings. Discuss nutritionwith nursing staff; continue comfort feedings; aspiration precautions  ASSESSMENT:     I visited and observed Kristi Perry. Explain purpose of palliative care visit the limited with cognitive impairment. She was cooperative with assessment. She did request to be repositioned in did complete tasks. Attempted to ask if she was having symptoms of pain and she replied no. Limited verbal discussion due to cognitive impairment. Will continue to Monitor and follow with palliative care. No new changes to current goals. I have attempted to contact Kristi Hua, Kristi. Perry's brother will continue for update on palliative care, goc. I updated nursing staff.  4 / 10 / 2020  wbc 6.0, hemoglobin 13.9, hematocrit 39.9, platelets 105  BMI 27.5 1 / 15 / 2020 weight 135.3 lbs 2 / 10 / 2020 weight 126.5 lbs 4 / 6 / 2020 weight 127.0 lbs  I spent 45 minutes providing this consultation,  From 2:30pm to 3:15pm. More than 50% of the time in this consultation was spent coordinating communication.   HISTORY OF PRESENT ILLNESS:  Kristi Perry is a 53 y.o. year old female with multiple medical problems including Cerebal palsy, chronic respiratory failure, congestive heart failure, COPD, dysphagia, epilepsy, history of DVT, hypothyroidism, hypertension, hyperlipidemia, chronic pain syndrome, hiatal hernia, migraine headaches, intellectual disability, nephrolithiasis, obesity, neurogenic bladder, anxiety was psicosis, bipolar, schizoaffective disorder,depression, vitamin D deficiency. Kristi Perry continues to reside at [facility]. She remains bed-bound lift to the recliner but lately she is remained in bed. She's total ADL dependents with incontinent bowel and bladder. She does require to be fed and appetite remains fair to poor. She has had a slight weight gain though overall significant weight loss since January. Last primary provider visit 3 / 24 / 2024 comprehensive review. She has been refusing her medications and food intermittently. She does have cerebral palsy on going with difficulty speaking mumbling her words at times. She does ruminate about having a baby. Moods are labile secondary to bipolar and schizoaffective disorder. At times she does scream at staff and refused care. She does have chronic pain secondary to immobility which is controlled on fentanyl patch. 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 She is followed by wound care in house for a pressure relieving mattress for unstageable sacral wound. She does have cognitive impairment but is able at times to make her needs known. She's followed  by Psychotherapy in addition to  Psychiatry with  last visit  4 / 9 / 2020 for mild intellectual disability, schizoaffective disorder, anxiety with insomnia. She does take depakene and Keppra for seizure disorder. Lamictal for mood. She currently is on clonazepam for anxiety. She does take Risperdal for SAD. She on fentanyl for chronic pain .  No medication adjustment at this time. No recent Falls, hospitalizations. At present she is lying in bed, she appears debilitated but comfortable. No visitors present. Healthcare power of attorney has transition to her brother, Kristi Perry at this time Palliative Care was asked to help to continue to address goals of care.   CODE STATUS: DNR  PPS: 30% HOSPICE ELIGIBILITY/DIAGNOSIS: TBD  PAST MEDICAL HISTORY:  Past Medical History:  Diagnosis Date   Acute respiratory failure (HCC)    Allergic rhinitis    Anxiety    Cerebral palsy (HCC)    Chronic airway obstruction (HCC)    Chronic pain syndrome    Depression    DVT (deep vein thrombosis) in pregnancy    Dysphagia    Dysphasia    Epilepsy (HCC)    Hiatal hernia    Hyperlipidemia    Hypertension    Hypothyroidism    Incontinent of feces 09-25-11   hx.   Incontinent of urine 09-25-11   hx.   Migraine    Mild intellectual disabilities    Morbid obesity (HCC) 09-25-11   requires Hoyer lift. pt. doesn't stand or ambulate.   Muscle weakness    Nephrolithiasis 04/05/2012   Neurogenic bladder    Pneumonia    hx of asp pneumonia 1/11-1/19/12   Sacral decubitus ulcer    hx of   Seizures (HCC)    Sepsis(995.91)    Sepsis(995.91)    hx of    Unspecified psychosis    Vitamin D deficiency     SOCIAL HX:  Social History   Tobacco Use   Smoking status: Never Smoker   Smokeless tobacco: Never Used  Substance Use Topics   Alcohol use: No    ALLERGIES:  Allergies  Allergen Reactions   Sulfa Antibiotics Hives   Tape Rash    Paper tape     PERTINENT  MEDICATIONS:  Outpatient Encounter Medications as of 07/01/2018  Medication Sig   acetaminophen (TYLENOL) 325 MG tablet Take 650 mg by mouth every 4 (four) hours as needed for mild pain or moderate pain.   albuterol (PROVENTIL HFA;VENTOLIN HFA) 108 (90 BASE) MCG/ACT inhaler Inhale 2 puffs into the lungs every 4 (four) hours as needed for wheezing or shortness of breath.   albuterol (PROVENTIL) (2.5 MG/3ML) 0.083% nebulizer solution Take 2.5 mg by nebulization every 8 (eight) hours as needed for wheezing or shortness of breath.   antiseptic oral rinse (BIOTENE) LIQD 15 mLs by Mouth Rinse route as needed for dry mouth.   arformoterol (BROVANA) 15 MCG/2ML NEBU Take 15 mcg by nebulization 2 (two) times daily.   budesonide (PULMICORT) 0.25 MG/2ML nebulizer solution Take 0.25 mg by nebulization 2 (two) times daily as needed (SOB, wheezing).    clonazePAM (KLONOPIN) 0.5 MG tablet Take 1 tablet (0.5 mg total) by mouth daily after breakfast.   clonazePAM (KLONOPIN) 0.5 MG tablet Take 1 tablet (0.5 mg total) by mouth 2 (two) times daily as needed for anxiety.   fentaNYL (DURAGESIC) 50 MCG/HR Place 1 patch (50 mcg total) onto the skin every 3 (three) days.   fexofenadine-pseudoephedrine (ALLEGRA-D) 60-120 MG 12 hr tablet Take 1 tablet by mouth daily.   fluticasone (FLONASE)  50 MCG/ACT nasal spray Place 2 sprays into both nostrils daily.   furosemide (LASIX) 20 MG tablet Take 1 tablet (20 mg total) by mouth every Monday, Wednesday, and Friday.   ketotifen (ZADITOR) 0.025 % ophthalmic solution Place 1 drop into both eyes 2 (two) times daily.   lamoTRIgine (LAMICTAL) 100 MG tablet Take 100 mg by mouth 2 (two) times daily.    levETIRAcetam (KEPPRA) 100 MG/ML solution Take 5 mLs (500 mg total) by mouth 2 (two) times daily.   Maltodextrin-Xanthan Gum (RESOURCE THICKENUP CLEAR) POWD For nectar thick liquid   Melatonin-Pyridoxine (MELATIN PO) Take 3 mg by mouth at bedtime.    montelukast  (SINGULAIR) 10 MG tablet Take 10 mg by mouth at bedtime.    potassium chloride (K-DUR) 10 MEQ tablet Take 1 tablet (10 mEq total) by mouth daily.   pravastatin (PRAVACHOL) 40 MG tablet Take 40 mg by mouth daily.    sodium chloride (OCEAN) 0.65 % nasal spray Place 1 spray into the nose 2 (two) times daily.   Valproate Sodium (DEPAKENE) 250 MG/5ML SOLN solution Take 10 mLs (500 mg total) by mouth every 8 (eight) hours.   vitamin C (ASCORBIC ACID) 500 MG tablet Take 500 mg by mouth 2 (two) times daily.   Vitamin D, Ergocalciferol, (DRISDOL) 50000 UNITS CAPS capsule Take 50,000 Units by mouth every 7 (seven) days.   No facility-administered encounter medications on file as of 07/01/2018.     PHYSICAL EXAM:   General: debiltiated, bedbound, chronically ill, confused female Cardiovascular: regular rate and rhythm Pulmonary: clear ant fields Abdomen: soft, nontender, + bowel sounds GU: no suprapubic tenderness Extremities: mild BLE edema, no joint deformities Skin: no rashes/sacral decubitus Neurological: Weakness but otherwise nonfocal/functional quadriplegic  Hayla Hinger Z Ravyn Nikkel, NP

## 2018-07-01 NOTE — Progress Notes (Signed)
Error   This encounter was created in error - please disregard. 

## 2018-07-02 ENCOUNTER — Other Ambulatory Visit: Payer: Self-pay

## 2018-09-02 ENCOUNTER — Encounter: Payer: Medicare Other | Admitting: Nurse Practitioner

## 2018-09-02 ENCOUNTER — Encounter: Payer: Self-pay | Admitting: Nurse Practitioner

## 2018-09-02 ENCOUNTER — Non-Acute Institutional Stay: Payer: Medicare Other | Admitting: Nurse Practitioner

## 2018-09-02 VITALS — BP 126/70 | HR 98 | Temp 98.7°F | Resp 22 | Ht <= 58 in | Wt 130.1 lb

## 2018-09-02 DIAGNOSIS — R630 Anorexia: Secondary | ICD-10-CM

## 2018-09-02 DIAGNOSIS — Z515 Encounter for palliative care: Secondary | ICD-10-CM

## 2018-09-02 DIAGNOSIS — R5381 Other malaise: Secondary | ICD-10-CM

## 2018-09-02 NOTE — Progress Notes (Signed)
[place] Consult Note Telephone: [phone]  Fax: [phone]  PATIENT NAME: Kristi Perry DOB: 1965-12-03 MRN: [id number]  PRIMARY CARE PROVIDER:   Merlene Laughter, MD  REFERRING PROVIDER:  Dr [facility] RESPONSIBLE PARTY:   Brother Kristi Perry [phone] or [phone] Starlette Thurow father POA financial and medical [phone] and [phone]  RECOMMENDATIONS and PLAN:  1.Palliative care encounter Z51.5; Palliative medicine team will continue to support patient, patient's family, and medical team. Visit consisted of counseling and education dealing with the complex and emotionally intense issues of symptom management and palliative care in the setting of serious and potentially life-threatening illness  DNR, do not intubate, do not hospitalize, no blood transfusion, no diagnostic testing, no feeding tube, new lab testing, no surgical intervention but wishes are for IV fluids, antibiotic therapy and use of end-of-life comfort medications from  2. Debility R53.81 secondary to cerebal palsy progressive, encourage passive rom; encourage to transfer to geri-chair.   3. Anorexia R63.0 secondary to  appetite remaining declined despite recent weight gain. Continue to encourage supplements and comfort feedings. Discuss nutrition with nursing staff; continue comfort feedings; aspiration precautions  ASSESSMENT:     I visit and observed Kristi Perry. She did make eye contact with verbal cues. Explain purpose of palliative care follow-up visit though with cognitive impairment limited understanding. When asked if she was having symptoms of pain or shortness of breath she replied no. We talked about her appetite asked if she was hungry and she replied no. She was cooperative with assessment. Emotional support provided. Will continue to monitor and follow with palliative care with next visit in 4 weeks if needed or Center  should she declined. I have been in nursing staff that she does appear stable at present time will continue to monitor weights and wound.  BMI 28 .2 1 / 15 / 2,020 weight 135.3 lbs 3 / 11 / 2020 weight 132.0 lbs 5 / 19 / 2,020 weight 134.4 lbs 6 / 9 / 2020 weight 130.1 lbs  5 / 20 / 2020 WBC 4.2, hemoglobin 13, hematocrit 38.7, platelets 180, albumin 3.1, total protein 4.9  4 / 11 / 2020 covid-19 negative   I spent 45 minutes providing this consultation,  from 1:30pm to 2:15pm.  More than 50% of the time in this consultation was spent coordinating communication.   HISTORY OF PRESENT ILLNESS:  Kristi Perry is a 53 y.o. year old female with multiple medical problems including Cerebal palsy, chronic respiratory failure, congestive heart failure, COPD, dysphagia, epilepsy, history of DVT, hypothyroidism, hypertension, hyperlipidemia, chronic pain syndrome, hiatal hernia, migraine headaches, intellectual disability, nephrolithiasis, obesity, neurogenic bladder, anxiety was psicosis, bipolar, schizoaffective disorder,depression, vitamin D deficiency.  Kristi Perry continues to reside at [place] [facility]. She remains functioning quadriplegic, total ADL dependence, incontinent bowel and bladder. She does require to be fed and weights have been relatively stable. She does continue to have difficulty with behaviors yelling out intermittently at times that staff. No recent falls, hospitalizations, infections. She is able to verbalize simple needs though with cognitive impairment unable to have discussions. At present she is lying in bed contracted, severely debilitated, chronically ill but appears comfortable. No visitors present. Palliative Care was asked to help address goals of care.   CODE STATUS: DNR  PPS: 30% HOSPICE ELIGIBILITY/DIAGNOSIS: TBD  PAST MEDICAL HISTORY:  Past Medical History:  Diagnosis Date   Acute respiratory  failure (HCC)     Allergic rhinitis    Anxiety    Cerebral palsy (HCC)    Chronic airway obstruction (HCC)    Chronic pain syndrome    Depression    DVT (deep vein thrombosis) in pregnancy    Dysphagia    Dysphasia    Epilepsy (HCC)    Hiatal hernia    Hyperlipidemia    Hypertension    Hypothyroidism    Incontinent of feces 09-25-11   hx.   Incontinent of urine 09-25-11   hx.   Migraine    Mild intellectual disabilities    Morbid obesity (HCC) 09-25-11   requires Hoyer lift. pt. doesn't stand or ambulate.   Muscle weakness    Nephrolithiasis 04/05/2012   Neurogenic bladder    Pneumonia    hx of asp pneumonia 1/11-1/19/12   Sacral decubitus ulcer    hx of   Seizures (HCC)    Sepsis(995.91)    Sepsis(995.91)    hx of    Unspecified psychosis    Vitamin D deficiency     SOCIAL HX:  Social History   Tobacco Use   Smoking status: Never Smoker   Smokeless tobacco: Never Used  Substance Use Topics   Alcohol use: No    ALLERGIES:  Allergies  Allergen Reactions   Sulfa Antibiotics Hives   Tape Rash    Paper tape     PERTINENT MEDICATIONS:  Outpatient Encounter Medications as of 09/02/2018  Medication Sig   acetaminophen (TYLENOL) 325 MG tablet Take 650 mg by mouth every 4 (four) hours as needed for mild pain or moderate pain.   bisacodyl (DULCOLAX) 5 MG EC tablet Take 10 mg by mouth daily as needed for moderate constipation.   clonazePAM (KLONOPIN) 0.5 MG tablet Take 0.5 mg by mouth at bedtime.   clonazePAM (KLONOPIN) 1 MG tablet Take 1 mg by mouth daily.   fentaNYL (DURAGESIC) 50 MCG/HR Place 1 patch (50 mcg total) onto the skin every 3 (three) days.   furosemide (LASIX) 20 MG tablet Take 1 tablet (20 mg total) by mouth every Monday, Wednesday, and Friday.   guaiFENesin (ROBITUSSIN) 100 MG/5ML liquid Take 200 mg by mouth 4 (four) times daily as needed for cough.   lamoTRIgine (LAMICTAL) 100 MG tablet Take 100 mg by mouth 2 (two) times daily.     levETIRAcetam (KEPPRA) 100 MG/ML solution Take 5 mLs (500 mg total) by mouth 2 (two) times daily.   Melatonin 5 MG CAPS Take 1 capsule by mouth at bedtime.   risperiDONE (RISPERDAL) 3 MG tablet Take 3 mg by mouth 2 (two) times daily.   senna (SENOKOT) 8.6 MG TABS tablet Take 1 tablet by mouth at bedtime.   Valproate Sodium (DEPAKENE) 250 MG/5ML SOLN solution Take 10 mLs (500 mg total) by mouth every 8 (eight) hours.   [DISCONTINUED] fluticasone (FLONASE) 50 MCG/ACT nasal spray Place 2 sprays into both nostrils daily.   clonazePAM (KLONOPIN) 0.5 MG tablet Take 1 tablet (0.5 mg total) by mouth 2 (two) times daily as needed for anxiety.   [DISCONTINUED] albuterol (PROVENTIL HFA;VENTOLIN HFA) 108 (90 BASE) MCG/ACT inhaler Inhale 2 puffs into the lungs every 4 (four) hours as needed for wheezing or shortness of breath. (Patient not taking: Reported on 09/02/2018)   [DISCONTINUED] albuterol (PROVENTIL) (2.5 MG/3ML) 0.083% nebulizer solution Take 2.5 mg by nebulization every 8 (eight) hours as needed for wheezing or shortness of breath.   [DISCONTINUED] antiseptic oral rinse (BIOTENE) LIQD 15 mLs by Mouth  Rinse route as needed for dry mouth.   [DISCONTINUED] arformoterol (BROVANA) 15 MCG/2ML NEBU Take 15 mcg by nebulization 2 (two) times daily.   [DISCONTINUED] budesonide (PULMICORT) 0.25 MG/2ML nebulizer solution Take 0.25 mg by nebulization 2 (two) times daily as needed (SOB, wheezing).    [DISCONTINUED] clonazePAM (KLONOPIN) 0.5 MG tablet Take 1 tablet (0.5 mg total) by mouth daily after breakfast. (Patient not taking: Reported on 09/02/2018)   [DISCONTINUED] fexofenadine-pseudoephedrine (ALLEGRA-D) 60-120 MG 12 hr tablet Take 1 tablet by mouth daily.   [DISCONTINUED] ketotifen (ZADITOR) 0.025 % ophthalmic solution Place 1 drop into both eyes 2 (two) times daily.   [DISCONTINUED] Maltodextrin-Xanthan Gum (RESOURCE THICKENUP CLEAR) POWD For nectar thick liquid (Patient not taking: Reported on  09/02/2018)   [DISCONTINUED] Melatonin-Pyridoxine (MELATIN PO) Take 3 mg by mouth at bedtime.    [DISCONTINUED] montelukast (SINGULAIR) 10 MG tablet Take 10 mg by mouth at bedtime.    [DISCONTINUED] potassium chloride (K-DUR) 10 MEQ tablet Take 1 tablet (10 mEq total) by mouth daily. (Patient not taking: Reported on 09/02/2018)   [DISCONTINUED] pravastatin (PRAVACHOL) 40 MG tablet Take 40 mg by mouth daily.    [DISCONTINUED] sodium chloride (OCEAN) 0.65 % nasal spray Place 1 spray into the nose 2 (two) times daily.   [DISCONTINUED] vitamin C (ASCORBIC ACID) 500 MG tablet Take 500 mg by mouth 2 (two) times daily.   [DISCONTINUED] Vitamin D, Ergocalciferol, (DRISDOL) 50000 UNITS CAPS capsule Take 50,000 Units by mouth every 7 (seven) days.   No facility-administered encounter medications on file as of 09/02/2018.     PHYSICAL EXAM:   General: NAD, frail appearing, thin, severely debilitated, confused female Cardiovascular: regular rate and rhythm Pulmonary: clear ant fields Abdomen: soft, nontender, + bowel sounds GU: no suprapubic tenderness Extremities: mild BLE edema, no joint deformities Skin: no rashes Neurological: Weakness but otherwise nonfocal/functionally quadriplegic  Kristi Dilauro Prince RomeZ Mazella Deen, NP

## 2018-09-02 NOTE — Progress Notes (Signed)
Error   This encounter was created in error - please disregard. 

## 2018-09-03 ENCOUNTER — Other Ambulatory Visit: Payer: Self-pay

## 2018-10-03 DEATH — deceased

## 2018-11-27 ENCOUNTER — Encounter: Payer: Self-pay | Admitting: Nurse Practitioner
